# Patient Record
Sex: Female | Born: 1945 | Race: Black or African American | Hispanic: No | Marital: Married | Smoking: Former smoker
Health system: Southern US, Community
[De-identification: ages and names within clinical notes are randomized; demographics above are authoritative.]

## PROBLEM LIST (undated history)

## (undated) DIAGNOSIS — I255 Ischemic cardiomyopathy: Secondary | ICD-10-CM

## (undated) DIAGNOSIS — N183 Chronic kidney disease, stage 3 unspecified: Secondary | ICD-10-CM

## (undated) DIAGNOSIS — M545 Low back pain, unspecified: Secondary | ICD-10-CM

## (undated) DIAGNOSIS — M1990 Unspecified osteoarthritis, unspecified site: Secondary | ICD-10-CM

## (undated) DIAGNOSIS — Z87891 Personal history of nicotine dependence: Secondary | ICD-10-CM

## (undated) DIAGNOSIS — Z9981 Dependence on supplemental oxygen: Secondary | ICD-10-CM

## (undated) DIAGNOSIS — Z95810 Presence of automatic (implantable) cardiac defibrillator: Secondary | ICD-10-CM

## (undated) DIAGNOSIS — I509 Heart failure, unspecified: Secondary | ICD-10-CM

## (undated) DIAGNOSIS — E119 Type 2 diabetes mellitus without complications: Secondary | ICD-10-CM

## (undated) DIAGNOSIS — E039 Hypothyroidism, unspecified: Secondary | ICD-10-CM

## (undated) DIAGNOSIS — I219 Acute myocardial infarction, unspecified: Secondary | ICD-10-CM

## (undated) DIAGNOSIS — E7800 Pure hypercholesterolemia, unspecified: Secondary | ICD-10-CM

## (undated) DIAGNOSIS — I4901 Ventricular fibrillation: Secondary | ICD-10-CM

## (undated) DIAGNOSIS — G8929 Other chronic pain: Secondary | ICD-10-CM

## (undated) DIAGNOSIS — I10 Essential (primary) hypertension: Secondary | ICD-10-CM

## (undated) DIAGNOSIS — J189 Pneumonia, unspecified organism: Secondary | ICD-10-CM

## (undated) HISTORY — DX: Personal history of nicotine dependence: Z87.891

## (undated) HISTORY — PX: ABDOMINAL HYSTERECTOMY: SHX81

## (undated) HISTORY — DX: Pure hypercholesterolemia, unspecified: E78.00

## (undated) HISTORY — DX: Essential (primary) hypertension: I10

## (undated) HISTORY — DX: Ventricular fibrillation: I49.01

## (undated) HISTORY — DX: Ischemic cardiomyopathy: I25.5

## (undated) HISTORY — DX: Hypothyroidism, unspecified: E03.9

## (undated) HISTORY — DX: Acute myocardial infarction, unspecified: I21.9

---

## 1998-01-08 ENCOUNTER — Ambulatory Visit (HOSPITAL_COMMUNITY): Admission: RE | Admit: 1998-01-08 | Discharge: 1998-01-08 | Payer: Self-pay | Admitting: Cardiology

## 1998-01-16 ENCOUNTER — Ambulatory Visit (HOSPITAL_COMMUNITY): Admission: RE | Admit: 1998-01-16 | Discharge: 1998-01-17 | Payer: Self-pay | Admitting: General Surgery

## 1998-03-11 ENCOUNTER — Ambulatory Visit (HOSPITAL_COMMUNITY): Admission: RE | Admit: 1998-03-11 | Discharge: 1998-03-11 | Payer: Self-pay | Admitting: Cardiology

## 1998-04-07 ENCOUNTER — Inpatient Hospital Stay (HOSPITAL_COMMUNITY): Admission: RE | Admit: 1998-04-07 | Discharge: 1998-04-10 | Payer: Self-pay | Admitting: Neurosurgery

## 1998-10-30 ENCOUNTER — Ambulatory Visit (HOSPITAL_COMMUNITY): Admission: RE | Admit: 1998-10-30 | Discharge: 1998-10-30 | Payer: Self-pay | Admitting: Neurosurgery

## 1998-10-30 ENCOUNTER — Encounter: Payer: Self-pay | Admitting: Neurosurgery

## 1999-02-24 ENCOUNTER — Other Ambulatory Visit: Admission: RE | Admit: 1999-02-24 | Discharge: 1999-02-24 | Payer: Self-pay | Admitting: Obstetrics

## 1999-11-02 ENCOUNTER — Encounter: Payer: Self-pay | Admitting: Neurosurgery

## 1999-11-02 ENCOUNTER — Ambulatory Visit (HOSPITAL_COMMUNITY): Admission: RE | Admit: 1999-11-02 | Discharge: 1999-11-02 | Payer: Self-pay | Admitting: Neurosurgery

## 1999-11-13 ENCOUNTER — Encounter: Payer: Self-pay | Admitting: Neurosurgery

## 1999-11-13 ENCOUNTER — Ambulatory Visit (HOSPITAL_COMMUNITY): Admission: RE | Admit: 1999-11-13 | Discharge: 1999-11-13 | Payer: Self-pay | Admitting: Neurosurgery

## 2000-08-11 ENCOUNTER — Encounter: Admission: RE | Admit: 2000-08-11 | Discharge: 2000-08-11 | Payer: Self-pay | Admitting: Cardiology

## 2000-08-11 ENCOUNTER — Encounter: Payer: Self-pay | Admitting: Cardiology

## 2000-10-31 ENCOUNTER — Ambulatory Visit (HOSPITAL_COMMUNITY): Admission: RE | Admit: 2000-10-31 | Discharge: 2000-10-31 | Payer: Self-pay | Admitting: Neurosurgery

## 2000-10-31 ENCOUNTER — Encounter: Payer: Self-pay | Admitting: Neurosurgery

## 2001-11-01 ENCOUNTER — Ambulatory Visit (HOSPITAL_COMMUNITY): Admission: RE | Admit: 2001-11-01 | Discharge: 2001-11-01 | Payer: Self-pay | Admitting: Neurosurgery

## 2001-11-01 ENCOUNTER — Encounter: Payer: Self-pay | Admitting: Neurosurgery

## 2004-12-11 ENCOUNTER — Ambulatory Visit (HOSPITAL_COMMUNITY): Admission: RE | Admit: 2004-12-11 | Discharge: 2004-12-11 | Payer: Self-pay | Admitting: Cardiology

## 2005-08-02 ENCOUNTER — Encounter: Admission: RE | Admit: 2005-08-02 | Discharge: 2005-08-02 | Payer: Self-pay | Admitting: Cardiology

## 2005-09-16 ENCOUNTER — Ambulatory Visit (HOSPITAL_COMMUNITY): Admission: RE | Admit: 2005-09-16 | Discharge: 2005-09-16 | Payer: Self-pay | Admitting: Cardiology

## 2008-08-16 ENCOUNTER — Observation Stay (HOSPITAL_COMMUNITY): Admission: EM | Admit: 2008-08-16 | Discharge: 2008-08-18 | Payer: Self-pay | Admitting: Emergency Medicine

## 2008-11-08 ENCOUNTER — Ambulatory Visit (HOSPITAL_COMMUNITY): Admission: RE | Admit: 2008-11-08 | Discharge: 2008-11-08 | Payer: Self-pay | Admitting: Cardiology

## 2009-02-27 ENCOUNTER — Inpatient Hospital Stay (HOSPITAL_COMMUNITY): Admission: EM | Admit: 2009-02-27 | Discharge: 2009-03-03 | Payer: Self-pay | Admitting: Emergency Medicine

## 2009-02-27 DIAGNOSIS — I219 Acute myocardial infarction, unspecified: Secondary | ICD-10-CM

## 2009-02-27 HISTORY — DX: Acute myocardial infarction, unspecified: I21.9

## 2009-02-27 HISTORY — PX: CORONARY ANGIOPLASTY WITH STENT PLACEMENT: SHX49

## 2009-08-30 HISTORY — PX: CARDIAC DEFIBRILLATOR PLACEMENT: SHX171

## 2009-09-19 ENCOUNTER — Encounter (HOSPITAL_COMMUNITY): Admission: RE | Admit: 2009-09-19 | Discharge: 2009-12-05 | Payer: Self-pay | Admitting: Cardiology

## 2010-01-28 DIAGNOSIS — I4901 Ventricular fibrillation: Secondary | ICD-10-CM

## 2010-01-28 HISTORY — DX: Ventricular fibrillation: I49.01

## 2010-02-07 ENCOUNTER — Ambulatory Visit: Payer: Self-pay | Admitting: Pulmonary Disease

## 2010-02-07 ENCOUNTER — Ambulatory Visit: Payer: Self-pay | Admitting: Internal Medicine

## 2010-02-07 ENCOUNTER — Inpatient Hospital Stay (HOSPITAL_COMMUNITY): Admission: EM | Admit: 2010-02-07 | Discharge: 2010-02-20 | Payer: Self-pay | Admitting: Emergency Medicine

## 2010-02-09 ENCOUNTER — Encounter (INDEPENDENT_AMBULATORY_CARE_PROVIDER_SITE_OTHER): Payer: Self-pay | Admitting: Pulmonary Disease

## 2010-02-17 ENCOUNTER — Encounter: Payer: Self-pay | Admitting: Internal Medicine

## 2010-02-18 ENCOUNTER — Encounter: Payer: Self-pay | Admitting: Internal Medicine

## 2010-02-19 ENCOUNTER — Encounter: Payer: Self-pay | Admitting: Internal Medicine

## 2010-02-27 DEATH — deceased

## 2010-03-10 ENCOUNTER — Encounter (INDEPENDENT_AMBULATORY_CARE_PROVIDER_SITE_OTHER): Payer: Self-pay | Admitting: *Deleted

## 2010-03-25 ENCOUNTER — Ambulatory Visit: Payer: Self-pay

## 2010-03-25 ENCOUNTER — Encounter: Payer: Self-pay | Admitting: Internal Medicine

## 2010-06-01 DIAGNOSIS — I10 Essential (primary) hypertension: Secondary | ICD-10-CM

## 2010-06-01 DIAGNOSIS — I2589 Other forms of chronic ischemic heart disease: Secondary | ICD-10-CM | POA: Insufficient documentation

## 2010-06-01 DIAGNOSIS — I2510 Atherosclerotic heart disease of native coronary artery without angina pectoris: Secondary | ICD-10-CM | POA: Insufficient documentation

## 2010-06-01 DIAGNOSIS — Z95810 Presence of automatic (implantable) cardiac defibrillator: Secondary | ICD-10-CM | POA: Insufficient documentation

## 2010-06-02 ENCOUNTER — Ambulatory Visit: Payer: Self-pay | Admitting: Internal Medicine

## 2010-06-02 LAB — CONVERTED CEMR LAB: Troponin I: 0.02 ng/mL (ref <0.06)

## 2010-06-08 ENCOUNTER — Encounter (HOSPITAL_COMMUNITY)
Admission: RE | Admit: 2010-06-08 | Discharge: 2010-06-18 | Payer: Self-pay | Source: Home / Self Care | Admitting: Cardiology

## 2010-09-03 ENCOUNTER — Ambulatory Visit
Admission: RE | Admit: 2010-09-03 | Discharge: 2010-09-03 | Payer: Self-pay | Source: Home / Self Care | Attending: Internal Medicine | Admitting: Internal Medicine

## 2010-09-03 ENCOUNTER — Encounter: Payer: Self-pay | Admitting: Internal Medicine

## 2010-09-29 NOTE — Cardiovascular Report (Signed)
Summary: Implantable Device Registration Card   Implantable Device Registration Card   Imported By: Roderic Ovens 03/27/2010 08:46:54  _____________________________________________________________________  External Attachment:    Type:   Image     Comment:   External Document

## 2010-09-29 NOTE — Miscellaneous (Signed)
Summary: Device preload  Clinical Lists Changes  Observations: Added new observation of ICD INDICATN: CM (02/19/2010 11:47) Added new observation of ICDLEADSTAT1: active (02/19/2010 11:47) Added new observation of ICDLEADSER1: [id number] (02/19/2010 11:47) Added new observation of ICDLEADMOD1: 7122  (02/19/2010 11:47) Added new observation of ICDLEADLOC1: RV  (02/19/2010 11:47) Added new observation of ICD IMP MD: Crystal Manges, MD  (02/19/2010 11:47) Added new observation of ICDLEADDOI1: 02/18/2010  (02/19/2010 11:47) Added new observation of ICD IMPL DTE: 02/18/2010  (02/19/2010 11:47) Added new observation of ICD SERL#: [id number]  (02/19/2010 11:47) Added new observation of ICD MODL#: [id number]  (02/19/2010 65:78) Added new observation of ICDMANUFACTR: St Jude  (02/19/2010 11:47) Added new observation of ICD MD: Crystal Manges, MD  (02/19/2010 11:47)       ICD Specifications Following MD:  Crystal Manges, MD     ICD Vendor:  St Jude     ICD Model Number:  [id number]     ICD Serial Number:  [id number] ICD DOI:  02/18/2010     ICD Implanting MD:  Crystal Manges, MD  Lead 1:    Location: RV     DOI: 02/18/2010     Model #: [id number]     Serial #: [id number]     Status: active  Indications::  CM

## 2010-09-29 NOTE — Cardiovascular Report (Signed)
Summary: Office Visit   Office Visit   Imported By: Roderic Ovens 06/08/2010 11:24:27  _____________________________________________________________________  External Attachment:    Type:   Image     Comment:   External Document

## 2010-09-29 NOTE — Letter (Signed)
Summary: Device-Delinquent Check  [facility], Main Office  [address]   [place], [place] [address]   Phone: [phone]  Fax: [phone]     March 10, 2010 MRN: [id number]   [facility] [address] [place], [place]  [address]   Dear Ms. Clonch,  According to our records, you have not had your implanted device checked in the recommended period of time.  We are unable to determine appropriate device function without checking your device on a regular basis.  Please call our office to schedule an appointment as soon as possible.  If you are having your device checked by another physician, please call us so that we may update our records.  Thank you,  Altha Harm, LPN  March 10, 2010 11:00 AM  [facility]

## 2010-09-29 NOTE — Assessment & Plan Note (Signed)
Summary: pc2/lg   Visit Type:  pc2  CC:  chest tightness- Left arm pain 3 days ago.  History of Present Illness: Crystal Duran is seen following ICD implantation in June 2011 reported cardiac arrest occurring in the context of a previous myocardial infarction and catheterization demonstrating mostly in residual nonobstructive disease. Ejection fraction was 20-25%. Was felt her cardiomyopathy was out of proportion to her coronary disease. She has class II congestive failure  She has had problems last week with chest pain on the right side similar to what she says her heart attack was like there was some radiation to the left. She saw Dr. Sharyn Lull who undertook an electrocardiogram. He  arranged for her to have a stress test. His also had a problem with a cough and she is currently taking antibiotics.         Current Medications (verified): 1)  Aspirin 81 Mg Tbec (Aspirin) .... Take 1 Tablet By Mouth Once Daily 2)  Pantoprazole Sodium 40 Mg Tbec (Pantoprazole Sodium) .... Take 1 Tablet By Mouth Once Daily 3)  Carvedilol 25 Mg Tabs (Carvedilol) .... Take 1 Tablet By Mouth Two Times A Day 4)  Crestor 20 Mg Tabs (Rosuvastatin Calcium) .... Take 1 Tablet By Mouth Once Daily 5)  Effient 10 Mg Tabs (Prasugrel Hcl) .... Take 1 Tablet By Mouth Once Daily 6)  Furosemide 40 Mg Tabs (Furosemide) .... Take 1 Tablet By Mouth Two Times A Day 7)  Levothroid 125 Mcg Tabs (Levothyroxine Sodium) .... Take 1 Tablet By Mouth Once Daily 8)  Ramipril 10 Mg Caps (Ramipril) .... Take 1 Capsule By Mouth Two Times A Day 9)  Spironolactone 25 Mg Tabs (Spironolactone) .... Take 1 Tablet By Mouth Once Daily 10)  Losartan Potassium 50 Mg Tabs (Losartan Potassium) .... Take 1 Tablet By Mouth Once Daily 11)  Isosorbide Mononitrate Cr 120 Mg Xr24h-Tab (Isosorbide Mononitrate) .... 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 Jude Fortify ICD [phone] Compensated systolic heart failure Coronary artery disease History of inferior wall myocardial infarction in the past Status post left catheterization with patent coronary arteries Severe ischemic cardiomyopathy Hypertension Hypercholesteremia Hypothyroidism History of tobacco abuse Mild renal insufficiencY  Past Surgical History: Last updated: 06/01/2010 Single-chamber defibrillator implantation- St. Jude Fortify ICD 123-40Q hysterectomy  Family History: Last updated: 06/01/2010 Positive for coronary artery disease  Social History: Last updated: 06/01/2010 She lives in [place].   She is retired Runner, broadcasting/film/video Used to smoke in the past, quit 10 years ago Married   Vital Signs:  Patient profile:   65 year old female Height:      64 inches Weight:      185.50 pounds BMI:     31.96 Pulse rate:   79 / minute Pulse rhythm:   regular Resp:     18 per minute BP sitting:   150 / 80  (right arm) Cuff size:   large  Vitals Entered By: Vikki Ports (June 02, 2010 1:32 PM)  Physical Exam  General:  The patient was alert and oriented in no acute distress but ill apppearing HEENT Normal.  Neck veins were flat, carotids were brisk.  Lungs were clear.  Heart sounds were regular without murmurs or gallops.  Abdomen was soft with active bowel sounds. There is no clubbing cyanosis or edema. Skin Warm and dry  ICD Specifications Following MD:  Sherryl Manges, MD     ICD Vendor:  [facility] Jude     ICD Model Number:  [id number]     ICD Serial Number:  [id number] ICD DOI:  02/18/2010     ICD Implanting MD:  Sherryl Manges, MD  Lead 1:    Location: RV     DOI: 02/18/2010     Model #: [id number]     Serial #: [id number]     Status: active  Indications::  CM   ICD Follow Up Remote Check?  No Charge Time:  8.0 seconds     Battery  Est. Longevity:  9.0 years Underlying rhythm:  SR ICD Dependent:  No       ICD Device Measurements Right Ventricle:  Amplitude: 12 mV, Impedance: 450 ohms, Threshold: 0.75 V at 0.4 msec Shock Impedance: 69 ohms   Episodes Shock:  0     ATP:  0     Nonsustained:  0     Ventricular Pacing:  <1%  Brady Parameters Mode VVI     Lower Rate Limit:  40      Tachy Zones VF:  240     VT:  200     Next Remote Date:  09/03/2010     Next Cardiology Appt Due:  01/29/2011 Tech Comments:  Autocapture on.  Device function normal.  Merlin transmissions every 3 months.  ROV 6/12 with Dr. Graciela Husbands. Altha Harm, LPN  June 02, 2010 2:27 PM   Impression & Recommendations:  Problem # 1:  CHEST PAIN (ICD-786.50)  she had prolonged chest pain last week suggestive of myocardial ischemia. We will check a troponin as it might have an impact on whether Dr. Sharyn Lull would pursue stress testing or not. TnI was normal Her updated medication list for this problem includes:    Aspirin 81 Mg Tbec (Aspirin) .[name]... Take 1 tablet by mouth once daily    Carvedilol 25 Mg Tabs (Carvedilol) .[name]... Take 1 tablet by mouth two times a day    Effient 10 Mg Tabs (Prasugrel hcl) .[name]... Take 1 tablet by mouth once daily    Ramipril 10 Mg Caps (Ramipril) .[name]... Take 1 capsule by mouth two times a day    Isosorbide Mononitrate Cr 120 Mg Xr24h-tab (Isosorbide mononitrate) .[name]... Take one tablet by mouth daily  Her updated medication list for this problem includes:    Aspirin 81 Mg Tbec (Aspirin) .[name]... Take 1 tablet by mouth once daily    Carvedilol 25 Mg Tabs (Carvedilol) .[name]... Take 1 tablet by mouth two times a day    Effient 10 Mg Tabs (Prasugrel hcl) .[name]... Take 1 tablet by mouth once daily    Ramipril 10 Mg Caps (Ramipril) .[name]... Take 1 capsule by mouth two times a day    Isosorbide Mononitrate Cr 120 Mg Xr24h-tab (Isosorbide mononitrate) .[name]... Take one tablet by mouth daily  Problem # 2:  IMPLANTATION OF DEFIBRILLATOR,ST. JUDE  FORTIFY ICD 123-40Q (ICD-V45.02) Device parameters and data were reviewed and no changes were made  no intercurrent therapy  Problem # 3:  CARDIOMYOPATHY, ISCHEMIC (ICD-414.8)  stable on her current medications Her updated medication list for this problem includes:    Aspirin 81 Mg Tbec (Aspirin) .[name]... Take 1 tablet by mouth once daily    Carvedilol 25 Mg Tabs (Carvedilol) .[name]... Take 1 tablet by mouth two times a day    Effient 10 Mg Tabs (Prasugrel hcl) .[name]... Take 1 tablet by mouth once daily  Furosemide 40 Mg Tabs (Furosemide) .[name]... Take 1 tablet by mouth two times a day    Ramipril 10 Mg Caps (Ramipril) .[name]... Take 1 capsule by mouth two times a day    Spironolactone 25 Mg Tabs (Spironolactone) .[name]... Take 1 tablet by mouth once daily    Losartan Potassium 50 Mg Tabs (Losartan potassium) .[name]... Take 1 tablet by mouth once daily    Isosorbide Mononitrate Cr 120 Mg Xr24h-tab (Isosorbide mononitrate) .[name]... Take one tablet by mouth daily  Her updated medication list for this problem includes:    Aspirin 81 Mg Tbec (Aspirin) .[name]... Take 1 tablet by mouth once daily    Carvedilol 25 Mg Tabs (Carvedilol) .[name]... Take 1 tablet by mouth two times a day    Effient 10 Mg Tabs (Prasugrel hcl) .[name]... Take 1 tablet by mouth once daily    Furosemide 40 Mg Tabs (Furosemide) .[name]... Take 1 tablet by mouth two times a day    Ramipril 10 Mg Caps (Ramipril) .[name]... Take 1 capsule by mouth two times a day    Spironolactone 25 Mg Tabs (Spironolactone) .[name]... Take 1 tablet by mouth once daily    Losartan Potassium 50 Mg Tabs (Losartan potassium) .[name]... Take 1 tablet by mouth once daily    Isosorbide Mononitrate Cr 120 Mg Xr24h-tab (Isosorbide mononitrate) .[name]... Take one tablet by mouth daily  Orders: T-Troponin I ([id number])  Patient Instructions: 1)  Your physician wants you to follow-up in: June 2012  You will receive a reminder letter in the mail two months in advance. If you don't receive a letter, please  call our office to schedule the follow-up appointment. 2)  Your physician recommends that your Troponin checked today.

## 2010-09-29 NOTE — Procedures (Signed)
Summary: wch per pt call/rsc from nos on 7/7/lg   Current Medications (verified): 1)  Aspirin 81 Mg Tbec (Aspirin) .... Take 1 Tablet By Mouth Once Daily 2)  Pantoprazole Sodium 40 Mg Tbec (Pantoprazole Sodium) .... Take 1 Tablet By Mouth Once Daily 3)  Carvedilol 25 Mg Tabs (Carvedilol) .... Take 1 Tablet By Mouth Two Times A Day 4)  Crestor 20 Mg Tabs (Rosuvastatin Calcium) .... Take 1 Tablet By Mouth Once Daily 5)  Effient 10 Mg Tabs (Prasugrel Hcl) .... Take 1 Tablet By Mouth Once Daily 6)  Furosemide 40 Mg Tabs (Furosemide) .... Take 1 Tablet By Mouth Two Times A Day 7)  Levothroid 125 Mcg Tabs (Levothyroxine Sodium) .... Take 1 Tablet By Mouth Once Daily 8)  Ramipril 10 Mg Caps (Ramipril) .... Take 1 Capsule By Mouth Two Times A Day 9)  Spironolactone 25 Mg Tabs (Spironolactone) .... Take 1 Tablet By Mouth Once Daily 10)  Losartan Potassium 50 Mg Tabs (Losartan Potassium) .... Take 1 Tablet By Mouth Once Daily  Allergies (verified): No Known Drug Allergies   ICD Specifications Following MD:  Sherryl Manges, MD     ICD Vendor:  St Jude     ICD Model Number:  [phone]     ICD Serial Number:  [id number] ICD DOI:  02/18/2010     ICD Implanting MD:  Sherryl Manges, MD  Lead 1:    Location: RV     DOI: 02/18/2010     Model #: [id number]     Serial #: [id number]     Status: active  Indications::  CM   ICD Follow Up Battery Voltage:  95% V     Charge Time:  8.0 seconds     Battery Est. Longevity:  8.2-9.1 yrs Underlying rhythm:  SR   ICD Device Measurements Right Ventricle:  Amplitude: 12.0 mV, Impedance: 460 ohms, Threshold: 0.75 V at 0.4 msec  Episodes MS Episodes:  0     Shock:  0     ATP:  0     Nonsustained:  0     Atrial Therapies:  0 Ventricular Pacing:  <1%  Brady Parameters Mode VVI     Lower Rate Limit:  40      Tachy Zones VF:  240     VT:  200     Next Cardiology Appt Due:  06/01/2010 Tech Comments:  WOUND CHECK (NOS ON 03-05-10)--STERI STRIPS REMOVED.  NO REDNESS OR SWELLING AT  SITE.  NORMAL DEVICE FUNCTION.  NO CHANGES MADE.  INSTRUCTIONS WENT OVER W/PT IN REGARDS TO THERAPY.  PT ENROLLED IN [facility] AND UNDERSTANDS.  ROV IN 3 MTHS W/SK.  Vella Kohler  March 25, 2010 3:12 PM

## 2010-09-29 NOTE — Cardiovascular Report (Signed)
Summary: [facility]  [facility]   Imported By: Marylou Mccoy 03/20/2010 13:25:06  _____________________________________________________________________  External Attachment:    Type:   Image     Comment:   External Document

## 2010-09-29 NOTE — Cardiovascular Report (Signed)
Summary: Office Visit   Office Visit   Imported By: Roderic Ovens 03/27/2010 10:22:23  _____________________________________________________________________  External Attachment:    Type:   Image     Comment:   External Document

## 2010-10-11 ENCOUNTER — Encounter (INDEPENDENT_AMBULATORY_CARE_PROVIDER_SITE_OTHER): Payer: Self-pay | Admitting: *Deleted

## 2010-10-21 NOTE — Letter (Signed)
Summary: Remote Device Check  [facility], Main Office  [address]   [place], [place] [address]   Phone: [phone]  Fax: [phone]     October 11, 2010 MRN: [id number]   [facility] [address] [place], [place]  [address]   Dear Ms. Forgione,   Your remote transmission was recieved and reviewed by your physician.  All diagnostics were within normal limits for you.  __X___Your next transmission is scheduled for:  12-03-2010.  Please transmit at any time this day.  If you have a wireless device your transmission will be sent automatically.   Sincerely,  Vella Kohler

## 2010-10-21 NOTE — Cardiovascular Report (Signed)
Summary: Office Visit Remote   Office Visit Remote   Imported By: Roderic Ovens 10/13/2010 15:20:22  _____________________________________________________________________  External Attachment:    Type:   Image     Comment:   External Document

## 2010-11-15 LAB — COMPREHENSIVE METABOLIC PANEL
ALT: 16 U/L (ref 0–35)
AST: 15 U/L (ref 0–37)
Albumin: 2.9 g/dL — ABNORMAL LOW (ref 3.5–5.2)
Alkaline Phosphatase: 62 U/L (ref 39–117)
BUN: 4 mg/dL — ABNORMAL LOW (ref 6–23)
BUN: 6 mg/dL (ref 6–23)
Calcium: 8.4 mg/dL (ref 8.4–10.5)
Creatinine, Ser: 1.58 mg/dL — ABNORMAL HIGH (ref 0.4–1.2)
GFR calc Af Amer: 43 mL/min — ABNORMAL LOW (ref >60)
Glucose, Bld: 99 mg/dL (ref 70–99)
Potassium: 3.7 mEq/L (ref 3.5–5.1)
Sodium: 140 mEq/L (ref 135–145)
Sodium: 141 mEq/L (ref 135–145)
Total Protein: 5.8 g/dL — ABNORMAL LOW (ref 6.0–8.3)
Total Protein: 6.5 g/dL (ref 6.0–8.3)

## 2010-11-15 LAB — BASIC METABOLIC PANEL
BUN: 10 mg/dL (ref 6–23)
BUN: 6 mg/dL (ref 6–23)
BUN: 7 mg/dL (ref 6–23)
CO2: 22 mEq/L (ref 19–32)
CO2: 26 mEq/L (ref 19–32)
CO2: 26 mEq/L (ref 19–32)
CO2: 26 mEq/L (ref 19–32)
Calcium: 7.6 mg/dL — ABNORMAL LOW (ref 8.4–10.5)
Calcium: 7.9 mg/dL — ABNORMAL LOW (ref 8.4–10.5)
Calcium: 8.1 mg/dL — ABNORMAL LOW (ref 8.4–10.5)
Calcium: 8.1 mg/dL — ABNORMAL LOW (ref 8.4–10.5)
Calcium: 8.2 mg/dL — ABNORMAL LOW (ref 8.4–10.5)
Chloride: 107 mEq/L (ref 96–112)
Chloride: 112 mEq/L (ref 96–112)
Chloride: 99 mEq/L (ref 96–112)
Creatinine, Ser: 1.42 mg/dL — ABNORMAL HIGH (ref 0.4–1.2)
Creatinine, Ser: 1.76 mg/dL — ABNORMAL HIGH (ref 0.4–1.2)
Creatinine, Ser: 1.77 mg/dL — ABNORMAL HIGH (ref 0.4–1.2)
GFR calc Af Amer: 41 mL/min — ABNORMAL LOW (ref >60)
GFR calc Af Amer: 43 mL/min — ABNORMAL LOW (ref >60)
GFR calc Af Amer: 45 mL/min — ABNORMAL LOW (ref >60)
GFR calc Af Amer: 46 mL/min — ABNORMAL LOW (ref >60)
GFR calc non Af Amer: 29 mL/min — ABNORMAL LOW (ref >60)
Glucose, Bld: 106 mg/dL — ABNORMAL HIGH (ref 70–99)
Glucose, Bld: 111 mg/dL — ABNORMAL HIGH (ref 70–99)
Glucose, Bld: 116 mg/dL — ABNORMAL HIGH (ref 70–99)
Potassium: 3.1 mEq/L — ABNORMAL LOW (ref 3.5–5.1)
Potassium: 3.5 mEq/L (ref 3.5–5.1)
Potassium: 3.9 mEq/L (ref 3.5–5.1)
Potassium: 4.1 mEq/L (ref 3.5–5.1)
Sodium: 135 mEq/L (ref 135–145)
Sodium: 136 mEq/L (ref 135–145)

## 2010-11-15 LAB — CBC
HCT: 26.2 % — ABNORMAL LOW (ref 36.0–46.0)
HCT: 26.4 % — ABNORMAL LOW (ref 36.0–46.0)
HCT: 28.3 % — ABNORMAL LOW (ref 36.0–46.0)
HCT: 29.3 % — ABNORMAL LOW (ref 36.0–46.0)
HCT: 31.7 % — ABNORMAL LOW (ref 36.0–46.0)
Hemoglobin: 10.6 g/dL — ABNORMAL LOW (ref 12.0–15.0)
Hemoglobin: 10.8 g/dL — ABNORMAL LOW (ref 12.0–15.0)
Hemoglobin: 8.9 g/dL — ABNORMAL LOW (ref 12.0–15.0)
MCH: 31.6 pg (ref 26.0–34.0)
MCHC: 33.5 g/dL (ref 30.0–36.0)
MCHC: 33.5 g/dL (ref 30.0–36.0)
MCHC: 33.7 g/dL (ref 30.0–36.0)
MCHC: 33.7 g/dL (ref 30.0–36.0)
MCHC: 33.7 g/dL (ref 30.0–36.0)
MCHC: 34.2 g/dL (ref 30.0–36.0)
MCV: 93.6 fL (ref 78.0–100.0)
MCV: 93.7 fL (ref 78.0–100.0)
MCV: 94.2 fL (ref 78.0–100.0)
MCV: 94.3 fL (ref 78.0–100.0)
MCV: 94.4 fL (ref 78.0–100.0)
MCV: 94.6 fL (ref 78.0–100.0)
Platelets: 132 10*3/uL — ABNORMAL LOW (ref 150–400)
Platelets: 145 10*3/uL — ABNORMAL LOW (ref 150–400)
Platelets: 182 10*3/uL (ref 150–400)
Platelets: 202 10*3/uL (ref 150–400)
RBC: 2.79 MIL/uL — ABNORMAL LOW (ref 3.87–5.11)
RBC: 3.06 MIL/uL — ABNORMAL LOW (ref 3.87–5.11)
RBC: 3.08 MIL/uL — ABNORMAL LOW (ref 3.87–5.11)
RBC: 3.11 MIL/uL — ABNORMAL LOW (ref 3.87–5.11)
RBC: 3.37 MIL/uL — ABNORMAL LOW (ref 3.87–5.11)
RBC: 3.39 MIL/uL — ABNORMAL LOW (ref 3.87–5.11)
RDW: 16.5 % — ABNORMAL HIGH (ref 11.5–15.5)
RDW: 16.6 % — ABNORMAL HIGH (ref 11.5–15.5)
RDW: 16.7 % — ABNORMAL HIGH (ref 11.5–15.5)
RDW: 16.9 % — ABNORMAL HIGH (ref 11.5–15.5)
RDW: 17 % — ABNORMAL HIGH (ref 11.5–15.5)
WBC: 3.7 10*3/uL — ABNORMAL LOW (ref 4.0–10.5)
WBC: 5.4 10*3/uL (ref 4.0–10.5)
WBC: 5.7 10*3/uL (ref 4.0–10.5)
WBC: 6.9 10*3/uL (ref 4.0–10.5)
WBC: 7.2 10*3/uL (ref 4.0–10.5)

## 2010-11-15 LAB — CK TOTAL AND CKMB (NOT AT ARMC)
Relative Index: INVALID (ref 0.0–2.5)
Total CK: 84 U/L (ref 7–177)

## 2010-11-15 LAB — DIFFERENTIAL
Basophils Relative: 1 % (ref 0–1)
Eosinophils Relative: 3 % (ref 0–5)
Lymphs Abs: 0.7 10*3/uL (ref 0.7–4.0)
Monocytes Absolute: 0.3 10*3/uL (ref 0.1–1.0)
Monocytes Relative: 8 % (ref 3–12)
Neutrophils Relative %: 69 % (ref 43–77)

## 2010-11-15 LAB — PHOSPHORUS: Phosphorus: 3.9 mg/dL (ref 2.3–4.6)

## 2010-11-15 LAB — GLUCOSE, CAPILLARY
Glucose-Capillary: 105 mg/dL — ABNORMAL HIGH (ref 70–99)
Glucose-Capillary: 118 mg/dL — ABNORMAL HIGH (ref 70–99)

## 2010-11-15 LAB — TROPONIN I: Troponin I: 0.02 ng/mL (ref 0.00–0.06)

## 2010-11-15 LAB — PROTIME-INR: Prothrombin Time: 14 seconds (ref 11.6–15.2)

## 2010-11-15 LAB — APTT: aPTT: 29 seconds (ref 24–37)

## 2010-11-15 LAB — HEPARIN LEVEL (UNFRACTIONATED): Heparin Unfractionated: 0.47 IU/mL (ref 0.30–0.70)

## 2010-11-16 LAB — BASIC METABOLIC PANEL
BUN: 12 mg/dL (ref 6–23)
BUN: 13 mg/dL (ref 6–23)
BUN: 18 mg/dL (ref 6–23)
BUN: 20 mg/dL (ref 6–23)
BUN: 21 mg/dL (ref 6–23)
BUN: 21 mg/dL (ref 6–23)
BUN: 21 mg/dL (ref 6–23)
BUN: 22 mg/dL (ref 6–23)
CO2: 17 mEq/L — ABNORMAL LOW (ref 19–32)
CO2: 17 mEq/L — ABNORMAL LOW (ref 19–32)
CO2: 18 mEq/L — ABNORMAL LOW (ref 19–32)
CO2: 18 mEq/L — ABNORMAL LOW (ref 19–32)
CO2: 21 mEq/L (ref 19–32)
Calcium: 7 mg/dL — ABNORMAL LOW (ref 8.4–10.5)
Calcium: 7.1 mg/dL — ABNORMAL LOW (ref 8.4–10.5)
Calcium: 7.2 mg/dL — ABNORMAL LOW (ref 8.4–10.5)
Calcium: 7.2 mg/dL — ABNORMAL LOW (ref 8.4–10.5)
Calcium: 7.3 mg/dL — ABNORMAL LOW (ref 8.4–10.5)
Calcium: 7.6 mg/dL — ABNORMAL LOW (ref 8.4–10.5)
Chloride: 115 mEq/L — ABNORMAL HIGH (ref 96–112)
Chloride: 116 mEq/L — ABNORMAL HIGH (ref 96–112)
Chloride: 118 mEq/L — ABNORMAL HIGH (ref 96–112)
Chloride: 119 mEq/L — ABNORMAL HIGH (ref 96–112)
Creatinine, Ser: 1.05 mg/dL (ref 0.4–1.2)
Creatinine, Ser: 1.1 mg/dL (ref 0.4–1.2)
Creatinine, Ser: 1.19 mg/dL (ref 0.4–1.2)
Creatinine, Ser: 1.25 mg/dL — ABNORMAL HIGH (ref 0.4–1.2)
Creatinine, Ser: 1.32 mg/dL — ABNORMAL HIGH (ref 0.4–1.2)
Creatinine, Ser: 1.37 mg/dL — ABNORMAL HIGH (ref 0.4–1.2)
Creatinine, Ser: 1.46 mg/dL — ABNORMAL HIGH (ref 0.4–1.2)
GFR calc Af Amer: 44 mL/min — ABNORMAL LOW (ref >60)
GFR calc Af Amer: 47 mL/min — ABNORMAL LOW (ref >60)
GFR calc Af Amer: 49 mL/min — ABNORMAL LOW (ref >60)
GFR calc non Af Amer: 30 mL/min — ABNORMAL LOW (ref >60)
GFR calc non Af Amer: 39 mL/min — ABNORMAL LOW (ref >60)
GFR calc non Af Amer: 43 mL/min — ABNORMAL LOW (ref >60)
GFR calc non Af Amer: 50 mL/min — ABNORMAL LOW (ref >60)
GFR calc non Af Amer: 55 mL/min — ABNORMAL LOW (ref >60)
Glucose, Bld: 115 mg/dL — ABNORMAL HIGH (ref 70–99)
Glucose, Bld: 142 mg/dL — ABNORMAL HIGH (ref 70–99)
Glucose, Bld: 209 mg/dL — ABNORMAL HIGH (ref 70–99)
Glucose, Bld: 218 mg/dL — ABNORMAL HIGH (ref 70–99)
Glucose, Bld: 78 mg/dL (ref 70–99)
Glucose, Bld: 91 mg/dL (ref 70–99)
Potassium: 3.2 mEq/L — ABNORMAL LOW (ref 3.5–5.1)
Potassium: 3.4 mEq/L — ABNORMAL LOW (ref 3.5–5.1)
Potassium: 3.8 mEq/L (ref 3.5–5.1)
Potassium: 3.8 mEq/L (ref 3.5–5.1)
Potassium: 3.9 mEq/L (ref 3.5–5.1)
Sodium: 137 mEq/L (ref 135–145)
Sodium: 140 mEq/L (ref 135–145)
Sodium: 141 mEq/L (ref 135–145)

## 2010-11-16 LAB — GLUCOSE, CAPILLARY
Glucose-Capillary: 103 mg/dL — ABNORMAL HIGH (ref 70–99)
Glucose-Capillary: 108 mg/dL — ABNORMAL HIGH (ref 70–99)
Glucose-Capillary: 109 mg/dL — ABNORMAL HIGH (ref 70–99)
Glucose-Capillary: 116 mg/dL — ABNORMAL HIGH (ref 70–99)
Glucose-Capillary: 117 mg/dL — ABNORMAL HIGH (ref 70–99)
Glucose-Capillary: 117 mg/dL — ABNORMAL HIGH (ref 70–99)
Glucose-Capillary: 119 mg/dL — ABNORMAL HIGH (ref 70–99)
Glucose-Capillary: 124 mg/dL — ABNORMAL HIGH (ref 70–99)
Glucose-Capillary: 128 mg/dL — ABNORMAL HIGH (ref 70–99)
Glucose-Capillary: 136 mg/dL — ABNORMAL HIGH (ref 70–99)
Glucose-Capillary: 138 mg/dL — ABNORMAL HIGH (ref 70–99)
Glucose-Capillary: 140 mg/dL — ABNORMAL HIGH (ref 70–99)
Glucose-Capillary: 153 mg/dL — ABNORMAL HIGH (ref 70–99)
Glucose-Capillary: 164 mg/dL — ABNORMAL HIGH (ref 70–99)
Glucose-Capillary: 197 mg/dL — ABNORMAL HIGH (ref 70–99)
Glucose-Capillary: 65 mg/dL — ABNORMAL LOW (ref 70–99)
Glucose-Capillary: 74 mg/dL (ref 70–99)
Glucose-Capillary: 77 mg/dL (ref 70–99)
Glucose-Capillary: 83 mg/dL (ref 70–99)
Glucose-Capillary: 93 mg/dL (ref 70–99)
Glucose-Capillary: 94 mg/dL (ref 70–99)

## 2010-11-16 LAB — BASIC METABOLIC PANEL WITH GFR
BUN: 16 mg/dL (ref 6–23)
BUN: 22 mg/dL (ref 6–23)
CO2: 17 meq/L — ABNORMAL LOW (ref 19–32)
CO2: 19 meq/L (ref 19–32)
Calcium: 7.1 mg/dL — ABNORMAL LOW (ref 8.4–10.5)
Calcium: 7.2 mg/dL — ABNORMAL LOW (ref 8.4–10.5)
Chloride: 118 meq/L — ABNORMAL HIGH (ref 96–112)
Chloride: 118 meq/L — ABNORMAL HIGH (ref 96–112)
Creatinine, Ser: 1.03 mg/dL (ref 0.4–1.2)
Creatinine, Ser: 1.32 mg/dL — ABNORMAL HIGH (ref 0.4–1.2)
GFR calc non Af Amer: 41 mL/min — ABNORMAL LOW
GFR calc non Af Amer: 54 mL/min — ABNORMAL LOW
Glucose, Bld: 126 mg/dL — ABNORMAL HIGH (ref 70–99)
Glucose, Bld: 151 mg/dL — ABNORMAL HIGH (ref 70–99)
Potassium: 3.3 meq/L — ABNORMAL LOW (ref 3.5–5.1)
Potassium: 4.7 meq/L (ref 3.5–5.1)
Sodium: 138 meq/L (ref 135–145)
Sodium: 140 meq/L (ref 135–145)

## 2010-11-16 LAB — CBC
HCT: 29.1 % — ABNORMAL LOW (ref 36.0–46.0)
HCT: 35.1 % — ABNORMAL LOW (ref 36.0–46.0)
HCT: 42.5 % (ref 36.0–46.0)
Hemoglobin: 9.8 g/dL — ABNORMAL LOW (ref 12.0–15.0)
MCHC: 33.5 g/dL (ref 30.0–36.0)
MCHC: 33.6 g/dL (ref 30.0–36.0)
MCHC: 33.6 g/dL (ref 30.0–36.0)
MCHC: 34.5 g/dL (ref 30.0–36.0)
MCV: 93.1 fL (ref 78.0–100.0)
MCV: 93.3 fL (ref 78.0–100.0)
MCV: 94.3 fL (ref 78.0–100.0)
MCV: 94.7 fL (ref 78.0–100.0)
Platelets: 139 10*3/uL — ABNORMAL LOW (ref 150–400)
Platelets: 146 10*3/uL — ABNORMAL LOW (ref 150–400)
Platelets: 146 10*3/uL — ABNORMAL LOW (ref 150–400)
Platelets: 151 10*3/uL (ref 150–400)
Platelets: 190 10*3/uL (ref 150–400)
RBC: 3 MIL/uL — ABNORMAL LOW (ref 3.87–5.11)
RBC: 3.12 MIL/uL — ABNORMAL LOW (ref 3.87–5.11)
RDW: 16.6 % — ABNORMAL HIGH (ref 11.5–15.5)
RDW: 17 % — ABNORMAL HIGH (ref 11.5–15.5)
RDW: 17.2 % — ABNORMAL HIGH (ref 11.5–15.5)
WBC: 6.4 10*3/uL (ref 4.0–10.5)
WBC: 6.8 10*3/uL (ref 4.0–10.5)
WBC: 7.4 10*3/uL (ref 4.0–10.5)
WBC: 7.7 10*3/uL (ref 4.0–10.5)

## 2010-11-16 LAB — BLOOD GAS, ARTERIAL
Acid-base deficit: 7.9 mmol/L — ABNORMAL HIGH (ref 0.0–2.0)
Acid-base deficit: 9 mmol/L — ABNORMAL HIGH (ref 0.0–2.0)
Expiratory PAP: 5
FIO2: 0.3 %
FIO2: 1 %
Inspiratory PAP: 5
MECHVT: 500 mL
MECHVT: 500 mL
Mode: POSITIVE
O2 Saturation: 96.6 %
Patient temperature: 100
Patient temperature: 89.6
Patient temperature: 90
RATE: 12 resp/min
RATE: 12 resp/min
TCO2: 16.3 mmol/L (ref 0–100)
TCO2: 17.2 mmol/L (ref 0–100)
pCO2 arterial: 26.9 mmHg — ABNORMAL LOW (ref 35.0–45.0)
pH, Arterial: 7.389 (ref 7.350–7.400)

## 2010-11-16 LAB — CARDIAC PANEL(CRET KIN+CKTOT+MB+TROPI)
CK, MB: 2.3 ng/mL (ref 0.3–4.0)
Relative Index: INVALID (ref 0.0–2.5)
Relative Index: INVALID (ref 0.0–2.5)
Total CK: 77 U/L (ref 7–177)
Troponin I: 0.02 ng/mL (ref 0.00–0.06)

## 2010-11-16 LAB — MAGNESIUM
Magnesium: 1.7 mg/dL (ref 1.5–2.5)
Magnesium: 2.1 mg/dL (ref 1.5–2.5)

## 2010-11-16 LAB — URINALYSIS, ROUTINE W REFLEX MICROSCOPIC
Bilirubin Urine: NEGATIVE
Nitrite: NEGATIVE
Specific Gravity, Urine: 1.022 (ref 1.005–1.030)
Urobilinogen, UA: 1 mg/dL (ref 0.0–1.0)

## 2010-11-16 LAB — PHOSPHORUS
Phosphorus: 2.7 mg/dL (ref 2.3–4.6)
Phosphorus: 3.2 mg/dL (ref 2.3–4.6)
Phosphorus: 3.6 mg/dL (ref 2.3–4.6)

## 2010-11-16 LAB — POCT CARDIAC MARKERS
CKMB, poc: 1.1 ng/mL (ref 1.0–8.0)
Troponin i, poc: <0.05 ng/mL (ref 0.00–0.09)

## 2010-11-16 LAB — HEPATIC FUNCTION PANEL
ALT: 21 U/L (ref 0–35)
AST: 21 U/L (ref 0–37)
Albumin: 2.4 g/dL — ABNORMAL LOW (ref 3.5–5.2)
Bilirubin, Direct: 0.1 mg/dL (ref 0.0–0.3)
Total Protein: 5 g/dL — ABNORMAL LOW (ref 6.0–8.3)

## 2010-11-16 LAB — COMPREHENSIVE METABOLIC PANEL
AST: 57 U/L — ABNORMAL HIGH (ref 0–37)
Albumin: 3.6 g/dL (ref 3.5–5.2)
BUN: 21 mg/dL (ref 6–23)
Calcium: 8.1 mg/dL — ABNORMAL LOW (ref 8.4–10.5)
Chloride: 107 mEq/L (ref 96–112)
Creatinine, Ser: 1.84 mg/dL — ABNORMAL HIGH (ref 0.4–1.2)
GFR calc Af Amer: 33 mL/min — ABNORMAL LOW (ref >60)
GFR calc non Af Amer: 28 mL/min — ABNORMAL LOW (ref >60)
Total Bilirubin: 0.4 mg/dL (ref 0.3–1.2)

## 2010-11-16 LAB — PROTIME-INR
INR: 1.21 (ref 0.00–1.49)
Prothrombin Time: 14.1 seconds (ref 11.6–15.2)
Prothrombin Time: 15.2 s (ref 11.6–15.2)

## 2010-11-16 LAB — DIFFERENTIAL
Basophils Absolute: 0 10*3/uL (ref 0.0–0.1)
Basophils Absolute: 0 10*3/uL (ref 0.0–0.1)
Basophils Relative: 0 % (ref 0–1)
Eosinophils Absolute: 0.1 10*3/uL (ref 0.0–0.7)
Eosinophils Relative: 1 % (ref 0–5)
Eosinophils Relative: 4 % (ref 0–5)
Lymphocytes Relative: 52 % — ABNORMAL HIGH (ref 12–46)
Lymphs Abs: 1.4 10*3/uL (ref 0.7–4.0)
Lymphs Abs: 2.5 10*3/uL (ref 0.7–4.0)
Monocytes Absolute: 0.4 10*3/uL (ref 0.1–1.0)
Neutro Abs: 1.7 10*3/uL (ref 1.7–7.7)
Neutrophils Relative %: 76 % (ref 43–77)

## 2010-11-16 LAB — COMPREHENSIVE METABOLIC PANEL WITH GFR
ALT: 27 U/L (ref 0–35)
AST: 33 U/L (ref 0–37)
Albumin: 2.8 g/dL — ABNORMAL LOW (ref 3.5–5.2)
Alkaline Phosphatase: 59 U/L (ref 39–117)
BUN: 22 mg/dL (ref 6–23)
CO2: 19 meq/L (ref 19–32)
Calcium: 7.2 mg/dL — ABNORMAL LOW (ref 8.4–10.5)
Chloride: 116 meq/L — ABNORMAL HIGH (ref 96–112)
Creatinine, Ser: 1.26 mg/dL — ABNORMAL HIGH (ref 0.4–1.2)
GFR calc non Af Amer: 43 mL/min — ABNORMAL LOW
Glucose, Bld: 122 mg/dL — ABNORMAL HIGH (ref 70–99)
Potassium: 4.2 meq/L (ref 3.5–5.1)
Sodium: 138 meq/L (ref 135–145)
Total Bilirubin: 0.5 mg/dL (ref 0.3–1.2)
Total Protein: 5.3 g/dL — ABNORMAL LOW (ref 6.0–8.3)

## 2010-11-16 LAB — POCT I-STAT 3, ART BLOOD GAS (G3+)
Bicarbonate: 20.3 mEq/L (ref 20.0–24.0)
Patient temperature: 35.3
pH, Arterial: 7.388 (ref 7.350–7.400)

## 2010-11-16 LAB — MRSA PCR SCREENING: MRSA by PCR: NEGATIVE

## 2010-11-16 LAB — HEPARIN LEVEL (UNFRACTIONATED): Heparin Unfractionated: 0.53 IU/mL (ref 0.30–0.70)

## 2010-11-16 LAB — TSH: TSH: 10.297 u[IU]/mL — ABNORMAL HIGH (ref 0.350–4.500)

## 2010-11-16 LAB — URINE MICROSCOPIC-ADD ON

## 2010-11-16 LAB — TROPONIN I: Troponin I: 0.06 ng/mL (ref 0.00–0.06)

## 2010-11-16 LAB — APTT: aPTT: 25 seconds (ref 24–37)

## 2010-12-03 ENCOUNTER — Ambulatory Visit (INDEPENDENT_AMBULATORY_CARE_PROVIDER_SITE_OTHER): Payer: Medicare Other | Admitting: *Deleted

## 2010-12-03 DIAGNOSIS — Z95810 Presence of automatic (implantable) cardiac defibrillator: Secondary | ICD-10-CM

## 2010-12-03 DIAGNOSIS — I428 Other cardiomyopathies: Secondary | ICD-10-CM

## 2010-12-03 DIAGNOSIS — R0989 Other specified symptoms and signs involving the circulatory and respiratory systems: Secondary | ICD-10-CM

## 2010-12-06 LAB — CBC
HCT: 36.2 % (ref 36.0–46.0)
HCT: 37.7 % (ref 36.0–46.0)
HCT: 44.7 % (ref 36.0–46.0)
Hemoglobin: 11.9 g/dL — ABNORMAL LOW (ref 12.0–15.0)
MCHC: 34.2 g/dL (ref 30.0–36.0)
MCHC: 35.3 g/dL (ref 30.0–36.0)
MCV: 92.5 fL (ref 78.0–100.0)
MCV: 93 fL (ref 78.0–100.0)
MCV: 93.4 fL (ref 78.0–100.0)
Platelets: 179 10*3/uL (ref 150–400)
Platelets: 230 10*3/uL (ref 150–400)
RBC: 3.67 MIL/uL — ABNORMAL LOW (ref 3.87–5.11)
RBC: 4.05 MIL/uL (ref 3.87–5.11)
RDW: 15 % (ref 11.5–15.5)
WBC: 5.2 10*3/uL (ref 4.0–10.5)
WBC: 6.2 10*3/uL (ref 4.0–10.5)
WBC: 8.8 10*3/uL (ref 4.0–10.5)

## 2010-12-06 LAB — LIPID PANEL
Cholesterol: 269 mg/dL — ABNORMAL HIGH (ref 0–200)
HDL: 70 mg/dL (ref >39)
LDL Cholesterol: 117 mg/dL — ABNORMAL HIGH (ref 0–99)
LDL Cholesterol: 159 mg/dL — ABNORMAL HIGH (ref 0–99)
Total CHOL/HDL Ratio: 2.9 RATIO
Triglycerides: 91 mg/dL (ref <150)
VLDL: 18 mg/dL (ref 0–40)

## 2010-12-06 LAB — DIFFERENTIAL
Basophils Absolute: 0 10*3/uL (ref 0.0–0.1)
Eosinophils Absolute: 0 10*3/uL (ref 0.0–0.7)
Eosinophils Relative: 0 % (ref 0–5)
Lymphs Abs: 1.2 10*3/uL (ref 0.7–4.0)
Neutrophils Relative %: 82 % — ABNORMAL HIGH (ref 43–77)

## 2010-12-06 LAB — BASIC METABOLIC PANEL
BUN: 13 mg/dL (ref 6–23)
BUN: 14 mg/dL (ref 6–23)
BUN: 17 mg/dL (ref 6–23)
CO2: 25 mEq/L (ref 19–32)
CO2: 27 mEq/L (ref 19–32)
CO2: 28 mEq/L (ref 19–32)
Calcium: 8.1 mg/dL — ABNORMAL LOW (ref 8.4–10.5)
Calcium: 8.4 mg/dL (ref 8.4–10.5)
Calcium: 8.8 mg/dL (ref 8.4–10.5)
Chloride: 105 mEq/L (ref 96–112)
Chloride: 108 mEq/L (ref 96–112)
Chloride: 96 mEq/L (ref 96–112)
Creatinine, Ser: 1.45 mg/dL — ABNORMAL HIGH (ref 0.4–1.2)
Creatinine, Ser: 1.5 mg/dL — ABNORMAL HIGH (ref 0.4–1.2)
Creatinine, Ser: 1.52 mg/dL — ABNORMAL HIGH (ref 0.4–1.2)
Creatinine, Ser: 1.54 mg/dL — ABNORMAL HIGH (ref 0.4–1.2)
GFR calc Af Amer: 42 mL/min — ABNORMAL LOW (ref >60)
Glucose, Bld: 103 mg/dL — ABNORMAL HIGH (ref 70–99)
Glucose, Bld: 121 mg/dL — ABNORMAL HIGH (ref 70–99)
Sodium: 137 mEq/L (ref 135–145)

## 2010-12-06 LAB — PROTIME-INR
INR: 0.9 (ref 0.00–1.49)
INR: 1.2 (ref 0.00–1.49)
Prothrombin Time: 12.7 seconds (ref 11.6–15.2)
Prothrombin Time: 15.4 seconds — ABNORMAL HIGH (ref 11.6–15.2)

## 2010-12-06 LAB — CARDIAC PANEL(CRET KIN+CKTOT+MB+TROPI)
CK, MB: 2.5 ng/mL (ref 0.3–4.0)
CK, MB: 277.2 ng/mL — ABNORMAL HIGH (ref 0.3–4.0)
Total CK: 461 U/L — ABNORMAL HIGH (ref 7–177)
Troponin I: 30.47 ng/mL (ref 0.00–0.06)
Troponin I: >100 ng/mL (ref 0.00–0.06)

## 2010-12-06 LAB — APTT
aPTT: 28 seconds (ref 24–37)
aPTT: 44 seconds — ABNORMAL HIGH (ref 24–37)

## 2010-12-06 LAB — POCT I-STAT, CHEM 8
HCT: 44 % (ref 36.0–46.0)
Hemoglobin: 15 g/dL (ref 12.0–15.0)
Potassium: 3.2 mEq/L — ABNORMAL LOW (ref 3.5–5.1)
Sodium: 138 mEq/L (ref 135–145)
TCO2: 24 mmol/L (ref 0–100)

## 2010-12-06 LAB — TSH: TSH: 6.68 u[IU]/mL — ABNORMAL HIGH (ref 0.350–4.500)

## 2010-12-07 ENCOUNTER — Other Ambulatory Visit: Payer: Self-pay

## 2010-12-07 NOTE — Progress Notes (Signed)
icd remote check  

## 2010-12-10 LAB — COMPREHENSIVE METABOLIC PANEL
ALT: 9 U/L (ref 0–35)
Alkaline Phosphatase: 47 U/L (ref 39–117)
CO2: 22 mEq/L (ref 19–32)
Chloride: 105 mEq/L (ref 96–112)
GFR calc non Af Amer: 32 mL/min — ABNORMAL LOW (ref >60)
Glucose, Bld: 106 mg/dL — ABNORMAL HIGH (ref 70–99)
Potassium: 3.6 mEq/L (ref 3.5–5.1)
Sodium: 138 mEq/L (ref 135–145)
Total Bilirubin: 0.9 mg/dL (ref 0.3–1.2)

## 2010-12-10 LAB — BRAIN NATRIURETIC PEPTIDE: Pro B Natriuretic peptide (BNP): 693 pg/mL — ABNORMAL HIGH (ref 0.0–100.0)

## 2010-12-10 LAB — TROPONIN I: Troponin I: 0.01 ng/mL (ref 0.00–0.06)

## 2010-12-10 LAB — URINE MICROSCOPIC-ADD ON

## 2010-12-10 LAB — CK TOTAL AND CKMB (NOT AT ARMC): Relative Index: INVALID (ref 0.0–2.5)

## 2010-12-10 LAB — LIPID PANEL: Cholesterol: 248 mg/dL — ABNORMAL HIGH (ref 0–200)

## 2010-12-10 LAB — URINALYSIS, ROUTINE W REFLEX MICROSCOPIC
Nitrite: NEGATIVE
Protein, ur: NEGATIVE mg/dL
Specific Gravity, Urine: 1.019 (ref 1.005–1.030)
Urobilinogen, UA: 1 mg/dL (ref 0.0–1.0)

## 2010-12-10 LAB — CBC
MCHC: 33.9 g/dL (ref 30.0–36.0)
MCV: 94.1 fL (ref 78.0–100.0)
RBC: 4.99 MIL/uL (ref 3.87–5.11)

## 2010-12-10 LAB — TSH: TSH: 30.286 u[IU]/mL — ABNORMAL HIGH (ref 0.350–4.500)

## 2010-12-20 ENCOUNTER — Encounter: Payer: Self-pay | Admitting: *Deleted

## 2011-01-12 NOTE — Discharge Summary (Signed)
Crystal Duran, Crystal Duran             ACCOUNT NO.:  [id number]   MEDICAL RECORD NO.:  [id number]          PATIENT TYPE:  OBV   LOCATION:  4705                         FACILITY:  [facility]   PHYSICIAN:  Ricki Rodriguez, M.D.  DATE OF BIRTH:  09/22/1945   DATE OF ADMISSION:  08/16/2008  DATE OF DISCHARGE:  08/18/2008                               DISCHARGE SUMMARY   REFERRING PHYSICIAN:  Osvaldo Shipper. Spruill, M.D.   FINAL DIAGNOSES:  1. Positional vertigo.  2. Dehydration.  3. Hypertension.  4. Hypothyroidism.  5. Migraine headache.   DISCHARGE MEDICATIONS:  1. Cipro 500 mg one twice daily x5 days.  2. Meclizine 25 mg 3 times daily as needed.  3. Prilosec 20 mg one daily.  4. Potassium 10 mEq daily.  5. Calcium 600 mg plus vitamin D one daily from Monday.  6. Atenolol 25 mg half daily from Monday.  7. Norvasc 5 mg half tablet daily from Monday.  8. Synthroid 0.125 mg daily.  9. Hydrochlorothiazide 25 mg half tablet daily.   DISCHARGE DIET:  Low-sodium heart-healthy diet.   DISCHARGE ACTIVITIES:  The patient to increase activity slowly.   SPECIAL INSTRUCTION:  The patient is to stop any activity that causes  chest pain, shortness of breath, dizziness, sweating, or excessive  weakness and the patient to use walker for 4 or 5 days as needed.   Followed by Dr. Donia Guiles in 2 to 4 weeks.  The patient to call [phone] for appointment.   HISTORY:  This 65 year old black female presented with 1-week history of  nausea and dizziness along with vomiting.  The patient had history of  ear infections in the past, denies chest pain or fever, admits to the  urinary frequency and urgency.   PHYSICAL EXAMINATION:  VITAL SIGNS:  Pulse 82, respirations 16, and  blood pressure 156/98.  HEENT:  The patient is normocephalic and atraumatic.  She has brown  eyes.  Pupils equally reacting to light.  Extraocular movements intact.  NECK:  No JVD.  LUNGS:  Clear bilaterally.  HEART:  Normal S1 and  S2.  EXTREMITIES:  No edema.   LABORATORY DATA:  Normal hemoglobin, hematocrit, WBC count, and platelet  count.  INR 1.0.  Urinalysis suggestive of small leukocyte with  microscopic examination showing 3-6 wbc's and 3-6 rbc's.  Sodium 140 and  potassium 4.5.  BUN 20 and creatinine 1.3.   HOSPITAL COURSE:  The patient was placed in observation.  She was  started on IV fluids.  Her antihypertensive medications were held.  She  was given meclizine for positional vertigo and Cipro for urinary tract  infection.  Her condition improved in less than 48 hours and she was  able to ambulate holding the IV pole.  Hence, she was discharged home on  August 18, 2008, in satisfactory condition with followup by primary  care physician in 2-4 weeks.      Ricki Rodriguez, M.D.  Electronically Signed     ASK/MEDQ  D:  08/18/2008  T:  08/18/2008  Job:  [id number]

## 2011-01-12 NOTE — Cardiovascular Report (Signed)
Crystal Duran, FINCHER             ACCOUNT NO.:  [id number]   MEDICAL RECORD NO.:  [id number]          PATIENT TYPE:  INP   LOCATION:  2904                         FACILITY:  [facility]   PHYSICIAN:  Eduardo Osier. Sharyn Lull, M.D. DATE OF BIRTH:  04-30-1946   DATE OF PROCEDURE:  02/27/2009  DATE OF DISCHARGE:                            CARDIAC CATHETERIZATION   PROCEDURE:  1. Left cardiac catheterization with selective left and right coronary      angiography, left ventriculography via right groin using Judkins      technique.  2. Insertion of temporary transvenous pacemaker via right femoral      venous approach to right ventricular apex.  3. Successful percutaneous  transluminal coronary angioplasty to      proximal and mid right coronary artery using 2.5 x 15-mm long      Voyager balloon.  4. Successful deployment of 3.5 x 28-mm long Xience V stent in      proximal and mid right coronary artery.  5. Successful post dilatation of this Xience stent using 3.75 x 15-mm      long [place] Voyager balloon.   INDICATIONS FOR PROCEDURE:  Crystal Duran is a 65 year old black female  with past medical history significant for hypertension, hypothyroidism,  history of tobacco abuse, strong family history of coronary artery  disease, and noncompliant to medication and followup.  She came to the  ER complaining of retrosternal chest pain off and on since last week.  Last night, she woke up around 12 o'clock with chest pressure, grade  10/10, radiating to both arms, associated with nausea and mild shortness  of breath.  She did not seek any medical attention until this a.m.  EKG  done in the ER showed sinus tachycardia with Q-wave in lead III with ST  elevation in inferior leads and reciprocal ST changes in lateral leads.  States she thought this chest pain initially is from acid reflux so did  not seek any medical attention but as the pain got worse, so decided to  come to the ER.  Denies such episodes in the  past.  I discussed with the  patient at length regarding EKG changes, regarding emergency left cath,  possible PTCA and stenting, its risks and benefits, i.e., death, MI,  stroke, need for emergency CABG, risk of restenosis, local vascular  complications, etc. and consented for the procedure.   PROCEDURE:  After obtaining the informed consent, the patient was  brought to the cath lab and was placed on fluoroscopy table.  Right  groin was prepped and draped in usual fashion.  Xylocaine 1% was used  for local anesthesia in the right groin.  With the help of thin-wall  needle, 6-French arterial and venous sheaths were placed.  Both the  sheaths were aspirated and flushed.  Next, a 6-French left Judkins  catheter was advanced over the wire under fluoroscopic guidance up to  the ascending aorta.  Wire was pulled out.  The catheter was aspirated  and connected to the manifold.  Catheter was further advanced and  engaged into left coronary ostium.  Multiple views of the  left system  were taken.  Next, the catheter was disengaged and was pulled out over  the wire and was replaced with 6-French right guiding catheter which was  advanced over the wire under fluoroscopic guidance up to the ascending  aorta.  Wire was pulled out.  The catheter was aspirated and connected  to the manifold.  Catheter was further advanced and engaged into right  coronary ostium.  Multiple views of the right system were taken.  Next,  the catheter was disengaged at the end of the procedure and was replaced  with 6-French pigtail catheter which was advanced over the wire under  fluoroscopic guidance up to the ascending aorta.  Wire was pulled out.  The catheter was aspirated and connected to the manifold.  Catheter was  further advanced across the aortic valve into the LV.  LV pressures were  recorded.  Next, LV graph was done in 30-degree RAO position.  Post-  angiographic pressures were recorded from LV and then  pullback pressures  were recorded from the aorta.  There was no gradient across the aortic  valve.  Next, the pigtail catheter was pulled out over the wire.  Sheaths were aspirated and flushed.   FINDINGS:  LV was moderately enlarged.  There was inferior wall severe  hypokinesia, EF of 20-25%.  Left main has 5-10% distal stenosis.  LAD  has 5-10% ostial and 15-20% proximal and mid stenosis.  Diagonal 1 and 2  are very very small.  Diagonal 3 has 30% ostial stenosis which was of  moderate size.  Diagonal 4 was very very small.  Left circumflex has 5-  10% proximal stenosis.  OM1 is very small.  OM 2 and OM 3 were very very  small.  RCA was 100% occluded proximally which was filling distally from  collaterals from the left system.   Temporary transvenous pacemaker was inserted via right femoral venous  approach up to RV apex prior to PCI without difficulty which was  discontinued at the end of the procedure.   INTERVENTIONAL PROCEDURE:  Successful PTCA to proximal and mid RCA was  done using 2.5 x 15-mm long Voyager balloon for predilatation and then  3.5 x 28-mm long Xience V stent was deployed at 11 atmospheric pressure  in proximal and mid portion of RCA.  Stent was postdilated using 3.75 x  15-mm long [place] Voyager balloon going up to 18 to 20 atmospheric pressure.  Lesion dilated from 100% to 0% residual and with excellent TIMI grade 3  distal flow without evidence of dissection or distal embolization.  The  patient received weight-based Angiomax, 60 mg of prasugrel, IC nitro, IC  Nipride, IV Lopressor 5 mg x3, and IV labetalol 10 mg x2 during the  procedure.  The patient tolerated the procedure well.  There were no  complications.  Temporary transvenous pacemaker wire was discontinued at  the end of the procedure.  Door-to-balloon time in the cath lab  including transvenous pacemaker prior to the PCI was 30 minutes.  The  patient tolerated the procedure well.  There were no  complications.  The  patient was transferred to recovery room in stable condition.      Eduardo Osier. Sharyn Lull, M.D.  Electronically Signed     MNH/MEDQ  D:  02/27/2009  T:  02/28/2009  Job:  [id number]   cc:   Catheterization Lab  Osvaldo Shipper. Spruill, M.D.

## 2011-01-12 NOTE — Discharge Summary (Signed)
Crystal Duran, Crystal Duran             ACCOUNT NO.:  [id number]   MEDICAL RECORD NO.:  [id number]          PATIENT TYPE:  INP   LOCATION:  2013                         FACILITY:  [facility]   PHYSICIAN:  Crystal Duran. Crystal Duran, M.D. DATE OF BIRTH:  September 25, 1945   DATE OF ADMISSION:  02/27/2009  DATE OF DISCHARGE:  03/03/2009                               DISCHARGE SUMMARY   ADMITTING DIAGNOSES:  1. Acute inferior wall myocardial infarction.  2. Hypertensive emergency.  3. Hypertension.  4. Hypothyroidism.  5. History of tobacco abuse.  6. Positive family history of coronary artery disease.   DISCHARGE DIAGNOSES:  1. Status post acute inferior wall myocardial infarction, status post      percutaneous transluminal coronary angioplasty stenting to 100%      occluded right carotid artery.  2. Status post hypertensive emergency.  3. Hypertensive heart disease with systolic dysfunction.  4. Dilated cardiomyopathy.  5. Non-insulin-dependent diabetes mellitus, controlled by diet.  6. Hypercholesterolemia.  7. Tobacco abuse.  8. Hypothyroidism.  9. Positive family history of coronary artery disease.   DISCHARGE HOME MEDICATIONS:  1. Enteric-coated aspirin 325 mg 1 tablet daily.  2. Prasugrel 10 mg 1 tablet daily.  3. Coreg 25 mg 1 tablet every 12 hours.  4. Ramipril 10 mg 1 capsule twice daily.  5. Crestor 20 mg 1 tablet daily.  6. Nitrostat 0.4 mg sublingual use as directed.  7. Levothyroxine 0.125 mg 1 tablet daily as before.   DIET:  Low salt and low cholesterol.  The patient has been advised to  avoid sweets.   Post PTCA stent instructions have been given.  No driving, lifting,  pushing, or pulling for 1 week and increase activity slowly as  tolerated.  The patient is scheduled for phase II cardiac rehab as  outpatient.  Followup with me in 1 week and Crystal Duran as scheduled.   CONDITION ON DISCHARGE:  Stable.   BRIEF HISTORY AND HOSPITAL COURSE:  Crystal Duran is a 65 year old black  female with past medical history significant for hypertension,  hypothyroidism, history of tobacco abuse, strong family history of  coronary artery disease, noncompliant to medication.  She came to the  ER, complaining of retrosternal chest pain off and on since last 1 week.  Last night, she woke up with chest pressure, grade 10/10, radiating to  both arms, associated with nausea, did not seek any medical attention  until this a.m.  EKG done in the ER showed sinus tach with Q-waves in  lead III with ST elevation in the inferior leads and reciprocal changes  in lateral leads.  The patient thought the chest pain initially is from  acid reflux so did not seek any medical attention and as pain got worse,  so decided to come to ER.  Denies any such episodes of chest pain in the  past.   PAST MEDICAL HISTORY:  As above.   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   ALLERGY:  No known drug allergies.   MEDICATIONS AT HOME:  She is  on questionable blood pressure and thyroid  medication.   SOCIAL HISTORY:  She is married, retired Runner, broadcasting/film/video, smoked half pack per  day for 10 years, quit 10 years ago and no history of alcohol or drug  abuse.   FAMILY HISTORY:  Positive for coronary artery disease.   PHYSICAL EXAMINATION:  GENERAL:  She is alert, awake, and oriented x3 in  no acute distress.  VITAL SIGNS:  Blood pressure was 204/104, pulse was 115, sinus tach.  EYES:  Conjunctivae pink.  NECK:  Supple.  No JVD.  LUNGS:  Decreased breath sounds at bases.  CARDIOVASCULAR:  S1 and S2 normal.  There was soft S3 and S4 gallop.  ABDOMEN:  Soft.  Bowel sounds are present, nontender.  EXTREMITIES:  There is no clubbing, cyanosis, or edema.   LABORATORY DATA:  CK was 3558, MB 277.2, relative index 7.8, troponin I  was above 96.  Repeat cardiac enzymes, CK was 2901 and BUN 54, relative  index 5.7, troponin I was more than 100.   Repeat cardiac enzymes on March 01, 2009, CK 1015, MB 15.5, relative index 1.5, which has been trending  down.  Troponin I was 59.13.  Repeat cardiac enzymes on March 02, 2009, CK  461, MB 4.2, relative index 0.9, troponin I is down to 30.47.  Today,  her cardiac enzymes, CK 286, MB 2.5, relative index 0.9, troponin I is  down to 20.24.  CBC, hemoglobin was 12.8, hematocrit 36.2, white count  of 6.2.  Sodium 130, potassium 3.4, BUN 13, creatinine 1.52, glucose was  139.  Repeat BNP today is sodium 137, potassium 3.9, glucose fasting is  103, BUN 21, creatinine 1.45.  Her cholesterol was 205, LDL 117, HDL was  70, triglycerides 191.  TSH was 30.28.   BRIEF HOSPITAL COURSE:  The patient was transferred from ED directly to  the cath lab and underwent emergency left cath and PTCA stenting to 100%  occluded RCA as per procedure report.  The patient tolerated the  procedure well.  There were no complications.  The patient did have any  episodes of chest pain during the hospital stay.  Her groin is stable  with no evidence of hematoma or bruit.  Phase I and II cardiac rehab was  called.  The patient has been ambulating in hallway without any  problems.  Groin is stable with no evidence of hematoma or bruit.  The  patient will be discharged home on above medications and will be  followed up in my office in 1 week  and Crystal Duran has scheduled.  The patient will be restarted on her  levothyroxine as before.  The patient states she has not been taking her  medication regularly.  We will follow her thyroid function closely as  outpatient and the patient did schedule for phase II cardiac rehab as  outpatient.      Crystal Duran. Crystal Duran, M.D.  Electronically Signed     [facility]  D:  03/03/2009  T:  03/04/2009  Job:  [id number]   cc:   Crystal Duran, M.D.

## 2011-02-02 ENCOUNTER — Encounter: Payer: Self-pay | Admitting: Internal Medicine

## 2011-03-05 ENCOUNTER — Encounter: Payer: Self-pay | Admitting: Internal Medicine

## 2011-03-05 ENCOUNTER — Ambulatory Visit (INDEPENDENT_AMBULATORY_CARE_PROVIDER_SITE_OTHER): Payer: Medicare Other | Admitting: Internal Medicine

## 2011-03-05 DIAGNOSIS — I2589 Other forms of chronic ischemic heart disease: Secondary | ICD-10-CM

## 2011-03-05 DIAGNOSIS — Z95810 Presence of automatic (implantable) cardiac defibrillator: Secondary | ICD-10-CM

## 2011-03-05 DIAGNOSIS — I5022 Chronic systolic (congestive) heart failure: Secondary | ICD-10-CM | POA: Insufficient documentation

## 2011-03-05 DIAGNOSIS — I509 Heart failure, unspecified: Secondary | ICD-10-CM

## 2011-03-05 LAB — ICD DEVICE OBSERVATION
DEV-0020ICD: NEGATIVE
DEVICE MODEL ICD: 608161
HV IMPEDENCE: 82 Ohm
RV LEAD IMPEDENCE ICD: 437.5 Ohm
TOT-0008: 0
TOT-0009: 1
TOT-0010: 7
TZAT-0001SLOWVT: 1
TZAT-0004SLOWVT: 8
TZAT-0012SLOWVT: 200 ms
TZAT-0013SLOWVT: 1
TZAT-0018SLOWVT: NEGATIVE
TZAT-0019SLOWVT: 7.5 V
TZAT-0020SLOWVT: 1 ms
TZON-0003SLOWVT: 300 ms
TZON-0004SLOWVT: 40
TZON-0005SLOWVT: 6
TZST-0001SLOWVT: 3
TZST-0001SLOWVT: 4
TZST-0003SLOWVT: 40 J
TZST-0003SLOWVT: 40 J
VENTRICULAR PACING ICD: 0 pct

## 2011-03-05 NOTE — Assessment & Plan Note (Signed)
The patient's device was interrogated.  The information was reviewed. No changes were made in the programming.    

## 2011-03-05 NOTE — Assessment & Plan Note (Signed)
Stable  I will ask her to followup with Dr Sharyn Lull about ACE/ARB therapy

## 2011-03-05 NOTE — Assessment & Plan Note (Signed)
No recurrent ventricular arrhythmia 

## 2011-03-05 NOTE — Progress Notes (Signed)
  HPI  Crystal Duran is a 65 y.o. female seen following ICD implantation in June 2011 reported cardiac arrest occurring in the context of a previous myocardial infarction and catheterization demonstrating mostly in residual nonobstructive disease. Ejection fraction was 20-25%. Was felt her cardiomyopathy was out of proportion to her coronary disease. She has class II congestive failure with shortness of breaath with modest exertion The patient denies chest pain nocturnal dyspnea, orthopnea or peripheral edema.  There have been no palpitations, lightheadedness or syncope.   She was telling me about grandhchildren that she is raising   Past Medical History  Diagnosis Date  . Cardiac arrest - ventricular fibrillation     aborted- 2011  . 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 Jude Fortify ICD 123-40Q  . Systolic heart failure     compensated  . CAD (coronary artery disease)   . MI (myocardial infarction)     Hx of inferior wall MI   . S/P cardiac catheterization     s/p left cateterization with patent coronary arteries  . Ischemic cardiomyopathy     severe  . HTN (hypertension)   . Hypercholesteremia   . Hypothyroidism   . History of tobacco abuse   . Mild renal insufficiency     Past Surgical History  Procedure Date  . Defibrillator implantation     single chamber. [employer] Jude Fortify ICD [id number]. Remote- no.   [name] Unspecified area hysterectomy     Current Outpatient Prescriptions  Medication Sig Dispense Refill  . aspirin 81 MG tablet Take 81 mg by mouth daily.        . carvedilol (COREG) 25 MG tablet Take 25 mg by mouth 2 (two) times daily.        . furosemide (LASIX) 40 MG tablet Take 40 mg by mouth 2 (two) times daily.        . isosorbide mononitrate (IMDUR) 120 MG 24 hr tablet Take 120 mg by mouth daily.        [name] levothyroxine (SYNTHROID, LEVOTHROID) 125 MCG tablet Take 125 mcg by mouth daily.        [name] losartan (COZAAR) 50 MG  tablet Take 50 mg by mouth daily.        . pantoprazole (PROTONIX) 40 MG tablet Take 40 mg by mouth daily.        . prasugrel (EFFIENT) 10 MG TABS Take 10 mg by mouth daily.        . ramipril (ALTACE) 10 MG capsule Take 10 mg by mouth daily.       . rosuvastatin (CRESTOR) 20 MG tablet Take 20 mg by mouth daily.        [name] spironolactone (ALDACTONE) 25 MG tablet Take 25 mg by mouth daily.          No Known Allergies  Review of Systems negative except from HPI and PMH  Physical Exam Well developed and well nourished in no acute distress HENT normal E scleral and icterus clear Neck Supple JVP flat; carotids brisk and full Clear to ausculation Regular rate and rhythm, no murmurs gallops or rub Soft with active bowel sounds No clubbing cyanosis and edema Alert and oriented, grossly normal motor and sensory function Skin Warm and Dry    Assessment and  Plan

## 2011-03-05 NOTE — Assessment & Plan Note (Signed)
As above stable

## 2011-03-05 NOTE — Patient Instructions (Signed)
Your physician wants you to follow-up in: 1 year  You will receive a reminder letter in the mail two months in advance. If you don't receive a letter, please call our office to schedule the follow-up appointment.  Your physician recommends that you continue on your current medications as directed. Please refer to the Current Medication list given to you today.  

## 2011-03-31 DIAGNOSIS — 419620001 Death: Secondary | SNOMED CT

## 2011-03-31 DEATH — deceased

## 2011-06-03 ENCOUNTER — Encounter: Payer: Medicare Other | Admitting: *Deleted

## 2011-06-04 LAB — BASIC METABOLIC PANEL
CO2: 23 mEq/L (ref 19–32)
Chloride: 108 mEq/L (ref 96–112)
Creatinine, Ser: 1.48 mg/dL — ABNORMAL HIGH (ref 0.4–1.2)
GFR calc Af Amer: 43 mL/min — ABNORMAL LOW (ref >60)
Glucose, Bld: 103 mg/dL — ABNORMAL HIGH (ref 70–99)

## 2011-06-04 LAB — URINE MICROSCOPIC-ADD ON

## 2011-06-04 LAB — URINALYSIS, ROUTINE W REFLEX MICROSCOPIC
Glucose, UA: NEGATIVE mg/dL
Ketones, ur: NEGATIVE mg/dL
Protein, ur: 30 mg/dL — AB
Urobilinogen, UA: 0.2 mg/dL (ref 0.0–1.0)

## 2011-06-04 LAB — CBC
Hemoglobin: 13.4 g/dL (ref 12.0–15.0)
Hemoglobin: 14.2 g/dL (ref 12.0–15.0)
MCHC: 32.4 g/dL (ref 30.0–36.0)
MCHC: 32.8 g/dL (ref 30.0–36.0)
MCHC: 33 g/dL (ref 30.0–36.0)
MCV: 95.3 fL (ref 78.0–100.0)
MCV: 96.4 fL (ref 78.0–100.0)
Platelets: 196 10*3/uL (ref 150–400)
Platelets: 198 10*3/uL (ref 150–400)
RBC: 4.3 MIL/uL (ref 3.87–5.11)
RBC: 4.64 MIL/uL (ref 3.87–5.11)
RDW: 16 % — ABNORMAL HIGH (ref 11.5–15.5)
WBC: 7.7 10*3/uL (ref 4.0–10.5)

## 2011-06-04 LAB — DIFFERENTIAL
Basophils Absolute: 0 10*3/uL (ref 0.0–0.1)
Basophils Relative: 0 % (ref 0–1)
Eosinophils Absolute: 0 10*3/uL (ref 0.0–0.7)
Eosinophils Relative: 0 % (ref 0–5)
Monocytes Absolute: 0.2 10*3/uL (ref 0.1–1.0)

## 2011-06-04 LAB — COMPREHENSIVE METABOLIC PANEL
ALT: 8 U/L (ref 0–35)
Albumin: 3.4 g/dL — ABNORMAL LOW (ref 3.5–5.2)
Calcium: 8.5 mg/dL (ref 8.4–10.5)
GFR calc Af Amer: 57 mL/min — ABNORMAL LOW (ref >60)
Glucose, Bld: 124 mg/dL — ABNORMAL HIGH (ref 70–99)
Sodium: 139 mEq/L (ref 135–145)
Total Protein: 6.2 g/dL (ref 6.0–8.3)

## 2011-06-04 LAB — POCT I-STAT, CHEM 8
Calcium, Ion: 0.96 mmol/L — ABNORMAL LOW (ref 1.12–1.32)
Creatinine, Ser: 1.3 mg/dL — ABNORMAL HIGH (ref 0.4–1.2)
Glucose, Bld: 131 mg/dL — ABNORMAL HIGH (ref 70–99)
HCT: 47 % — ABNORMAL HIGH (ref 36.0–46.0)
Hemoglobin: 16 g/dL — ABNORMAL HIGH (ref 12.0–15.0)
TCO2: 22 mmol/L (ref 0–100)

## 2011-06-04 LAB — URINE CULTURE
Colony Count: NO GROWTH
Culture: NO GROWTH

## 2011-06-04 LAB — PROTIME-INR: Prothrombin Time: 13.1 seconds (ref 11.6–15.2)

## 2011-06-07 ENCOUNTER — Encounter: Payer: Self-pay | Admitting: *Deleted

## 2011-06-17 ENCOUNTER — Ambulatory Visit (INDEPENDENT_AMBULATORY_CARE_PROVIDER_SITE_OTHER): Payer: Medicare Other | Admitting: *Deleted

## 2011-06-17 DIAGNOSIS — Z95810 Presence of automatic (implantable) cardiac defibrillator: Secondary | ICD-10-CM

## 2011-06-17 DIAGNOSIS — I428 Other cardiomyopathies: Secondary | ICD-10-CM

## 2011-06-18 ENCOUNTER — Other Ambulatory Visit: Payer: Self-pay | Admitting: Internal Medicine

## 2011-06-18 ENCOUNTER — Encounter: Payer: Self-pay | Admitting: Internal Medicine

## 2011-06-18 LAB — REMOTE ICD DEVICE
BRDY-0002RV: 40 {beats}/min
BRDY-0005RV: 30 {beats}/min
DEV-0020ICD: NEGATIVE
DEVICE MODEL ICD: 608161
HV IMPEDENCE: 75 Ohm
RV LEAD AMPLITUDE: 12 mv
RV LEAD IMPEDENCE ICD: 440 Ohm
TZAT-0001SLOWVT: 1
TZAT-0004SLOWVT: 8
TZAT-0012SLOWVT: 200 ms
TZAT-0013SLOWVT: 1
TZAT-0018SLOWVT: NEGATIVE
TZAT-0019SLOWVT: 7.5 v
TZAT-0020SLOWVT: 1 ms
TZON-0003SLOWVT: 300 ms
TZON-0004SLOWVT: 40
TZON-0005SLOWVT: 6
TZON-0010SLOWVT: 40 ms
TZST-0001SLOWVT: 2
TZST-0001SLOWVT: 3
TZST-0001SLOWVT: 4
TZST-0001SLOWVT: 5
TZST-0003SLOWVT: 36 J
TZST-0003SLOWVT: 40 J
TZST-0003SLOWVT: 40 J
TZST-0003SLOWVT: 40 J
VENTRICULAR PACING ICD: 1 pct

## 2011-06-29 ENCOUNTER — Encounter: Payer: Self-pay | Admitting: *Deleted

## 2011-06-29 NOTE — Progress Notes (Signed)
icd remote check  

## 2011-07-01 DIAGNOSIS — 419620001 Death: Secondary | SNOMED CT

## 2011-07-01 DEATH — deceased

## 2011-07-24 ENCOUNTER — Other Ambulatory Visit: Payer: Self-pay | Admitting: Cardiology

## 2011-07-24 IMAGING — CR DG CHEST 1V PORT
1 series · 1 of 1 positions shown · non-contrast
Comparison: 02/28/2009

CLINICAL DATA: Cardiac arrest.  Status post CPR.  Intubation and
central line placement.

PORTABLE CHEST - 1 VIEW

[AP]
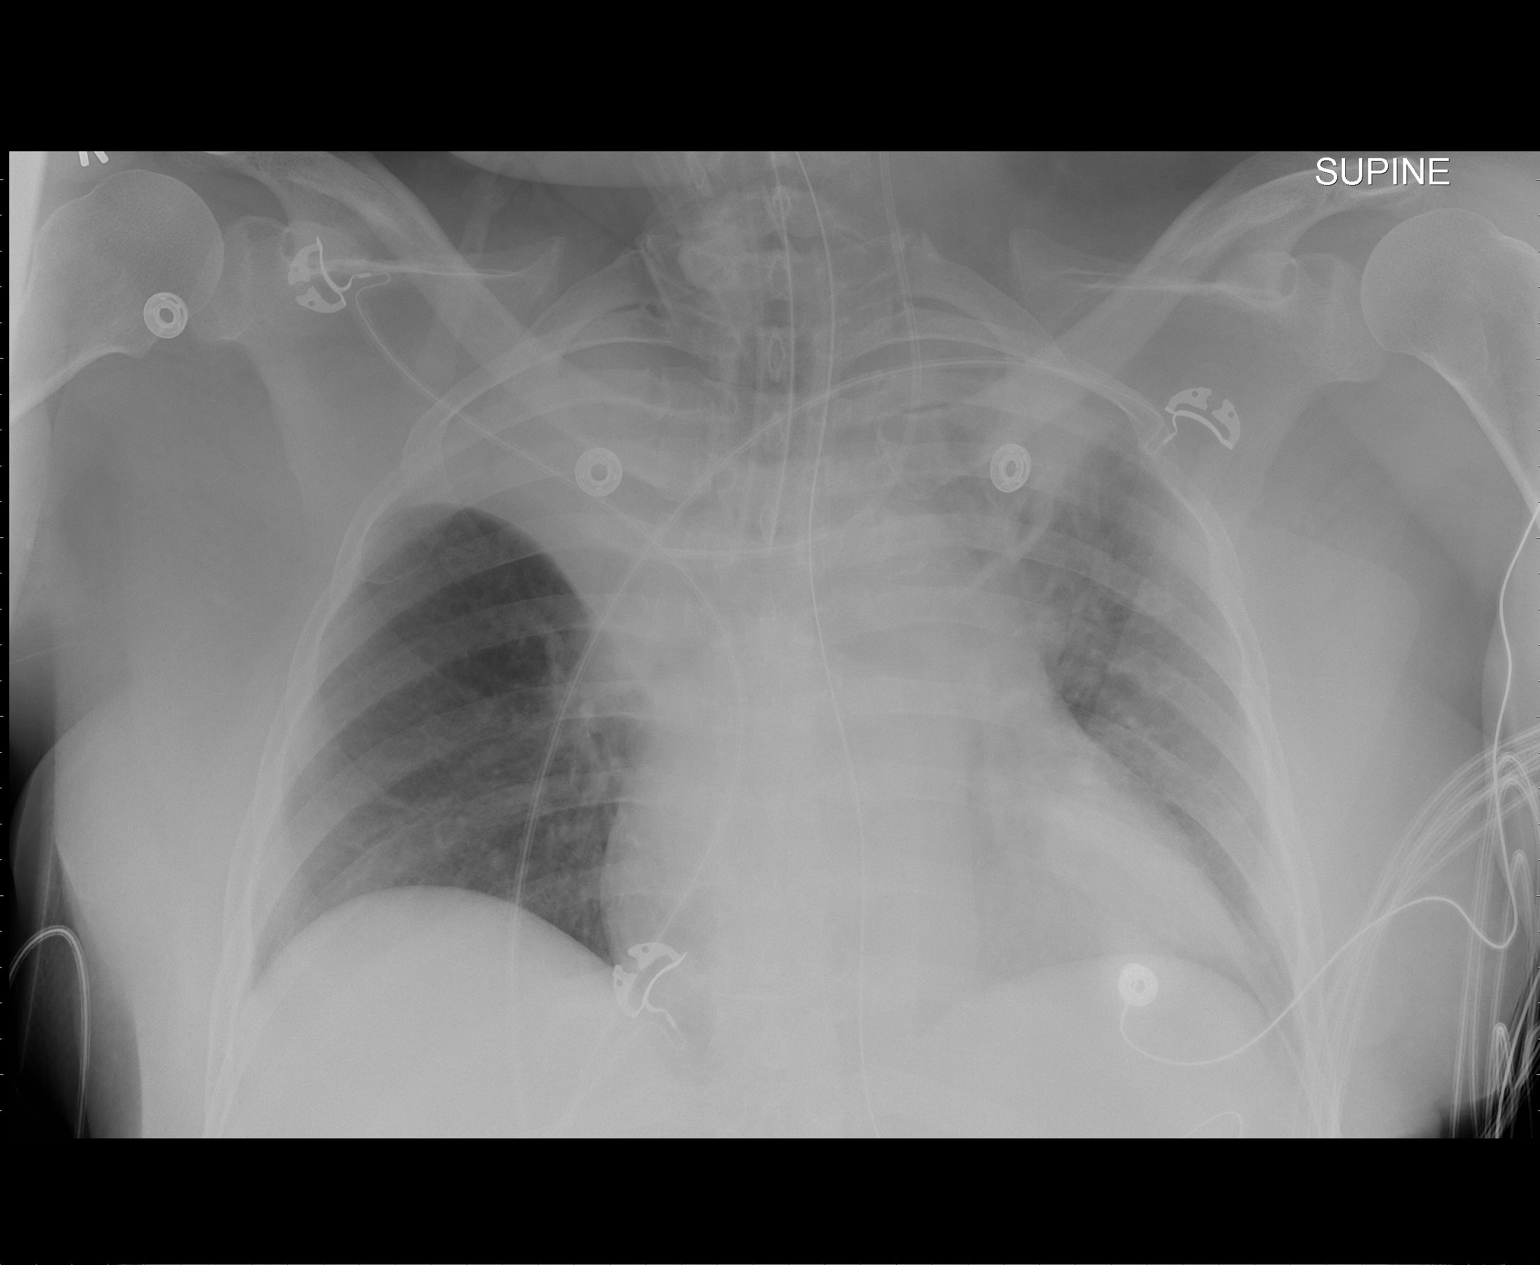

[1 of 1 positions shown; findings below may reference images not displayed]

FINDINGS: Endotracheal tube is seen with tip approximately 1 cm
above the carina.  A left jugular center venous catheter is seen
with tip along the side wall of the superior vena cava.  No
evidence of pneumothorax.  Nasogastric tube is seen within the
stomach.

Right upper lobe collapse is seen, as well as atelectasis or
infiltrate in the left upper lobe.  Lower lung fields are clear.
Heart size is stable.  The
IMPRESSION: 1.  Endotracheal tube tip approximately 1 cm above carina.
2.  Left jugular center venous catheter tip along the lateral side
wall of the superior vena cava.  No evidence of pneumothorax.
3.  Right upper lobe collapse, and left upper lobe atelectasis
versus infiltrate.

## 2011-07-24 IMAGING — CT CT HEAD W/O CM
1 of 2 series · 16 of 30 positions shown, 20 images · non-contrast
Comparison: 08/16/2008

CLINICAL DATA: Cardiac arrest.  Altered mental status.  Previous
surgery for brain tumor.

CT HEAD WITHOUT CONTRAST
TECHNIQUE: Contiguous axial images were obtained from the base of
the skull through the vertex without contrast.

[Series 3: recon 2: brain · axial · 0.49mm/px · z∈[+133,+278]mm · 16 of 64 slices shown, 20 images]
[im 4/64  brain]
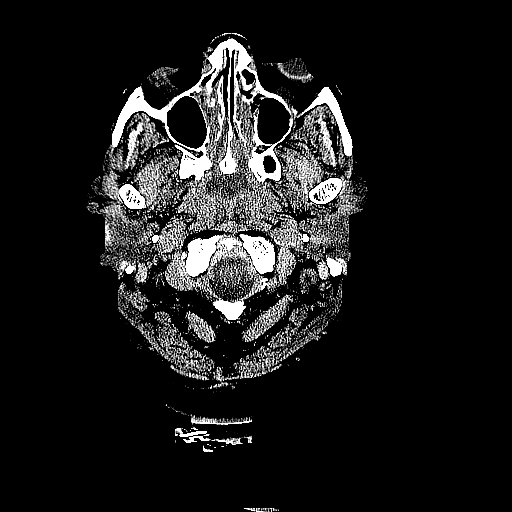
[im 4/64  bone]
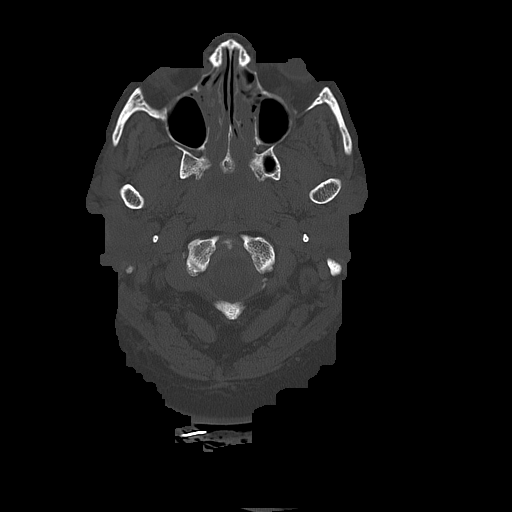
[im 7/64  brain]
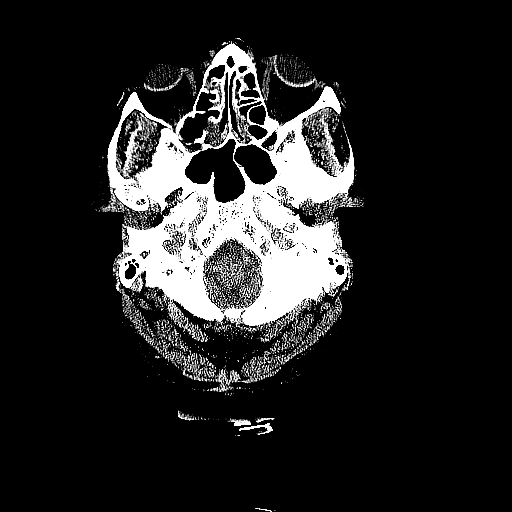
[im 10/64  brain]
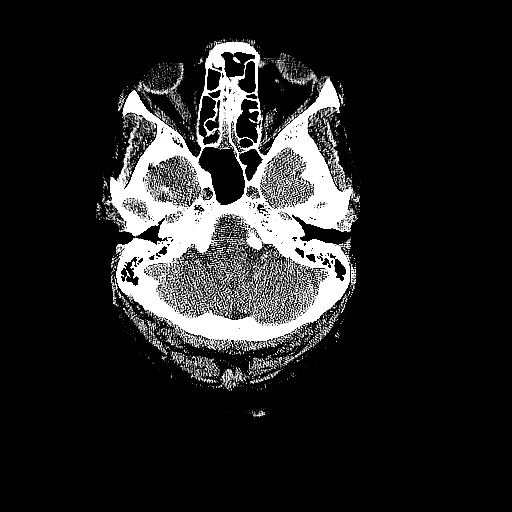
[im 14/64  brain]
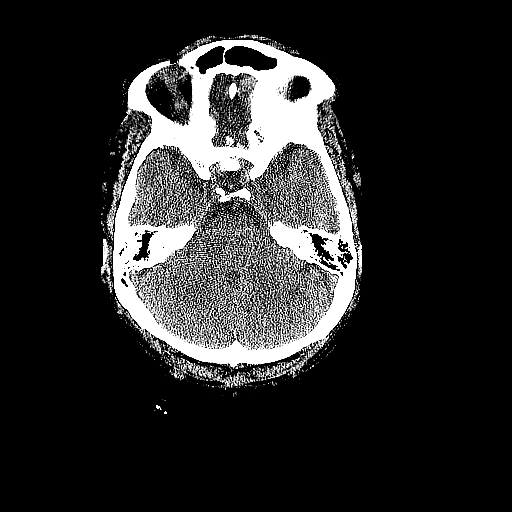
[im 20/64  brain]
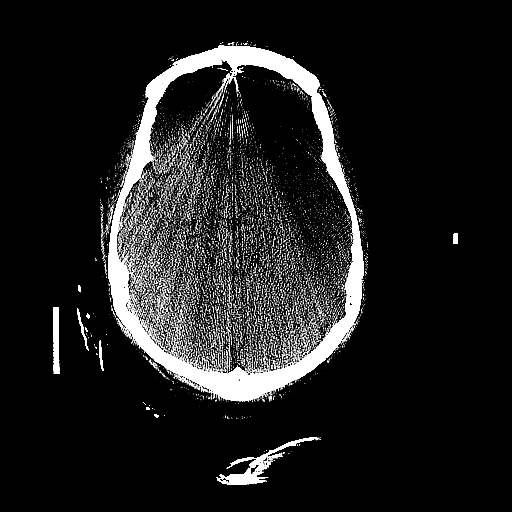
[im 20/64  bone]
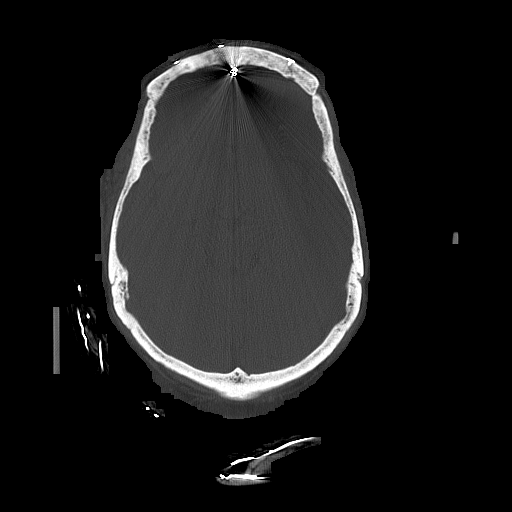
[im 24/64  brain]
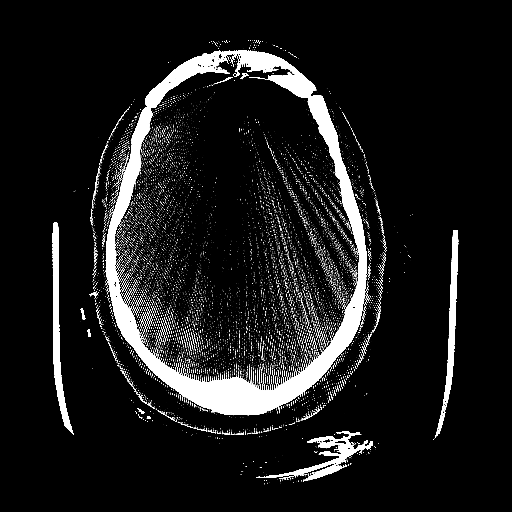
[im 27/64  brain]
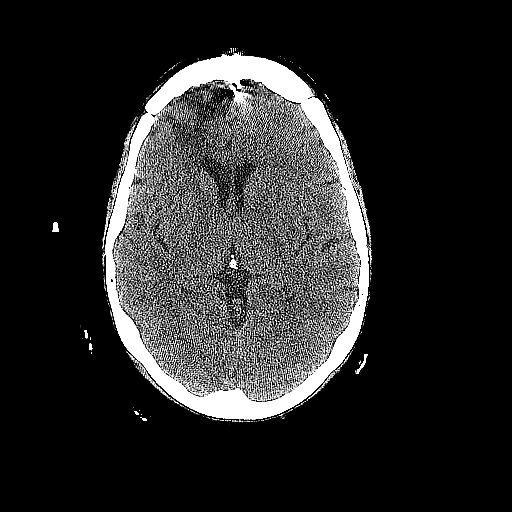
[im 30/64  brain]
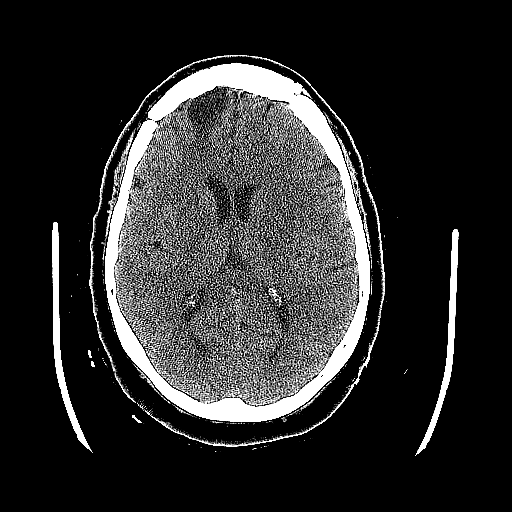
[im 34/64  brain]
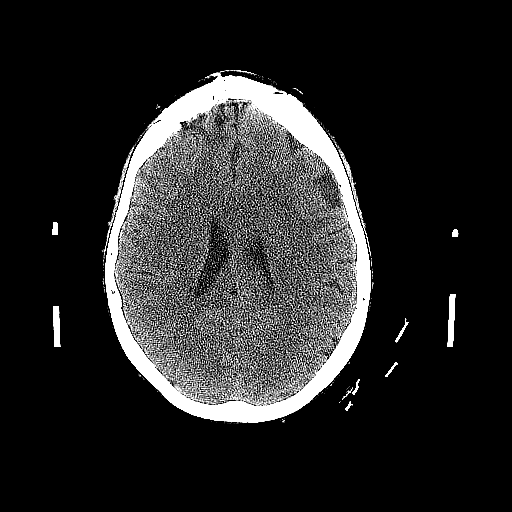
[im 34/64  bone]
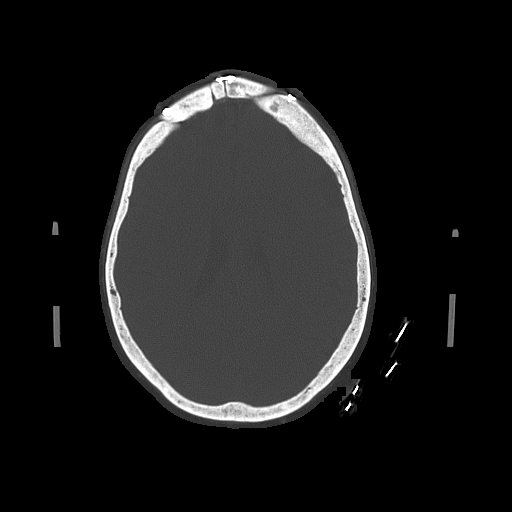
[im 37/64  brain]
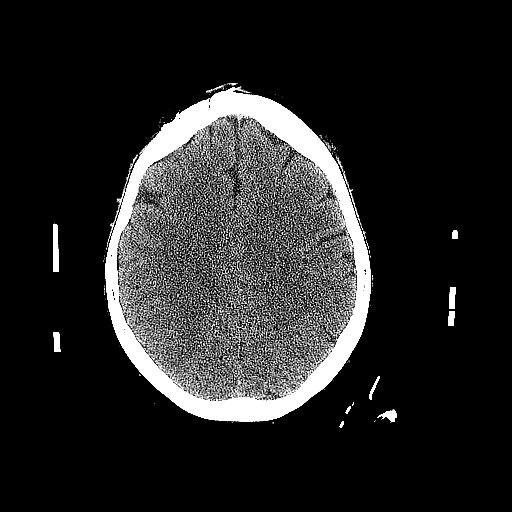
[im 40/64  brain]
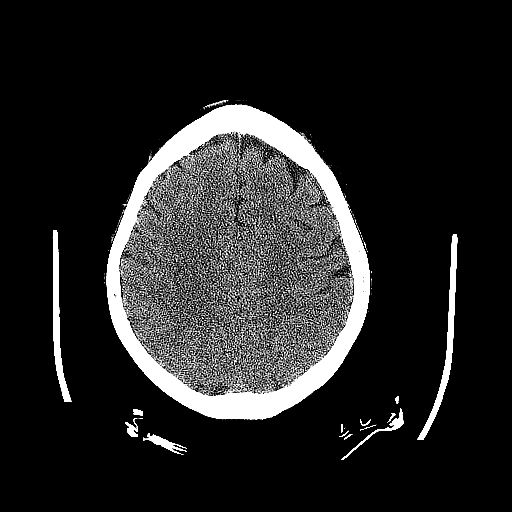
[im 44/64  brain]
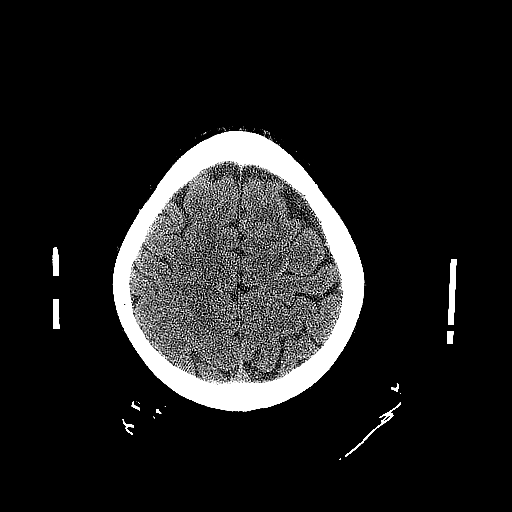
[im 50/64  brain]
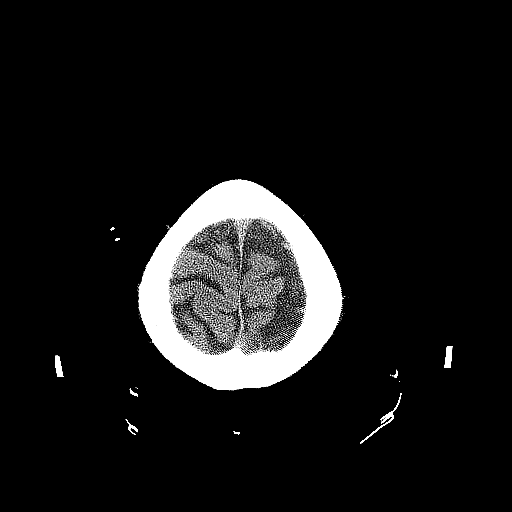
[im 50/64  bone]
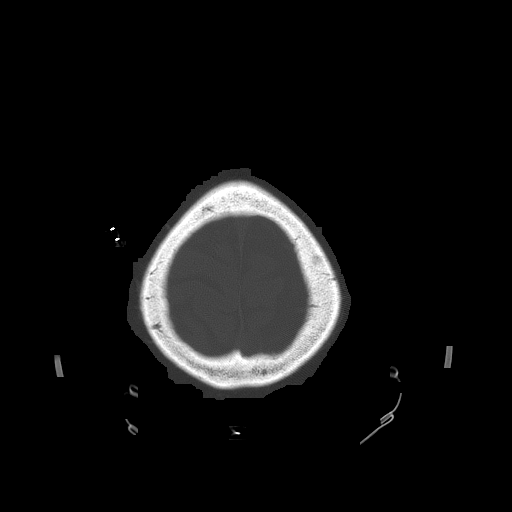
[im 54/64  brain]
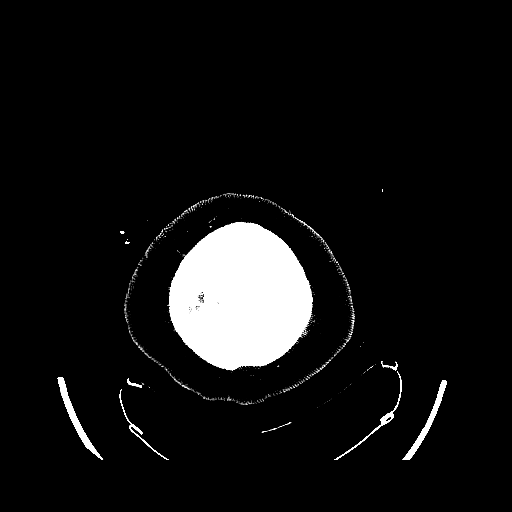
[im 57/64  brain]
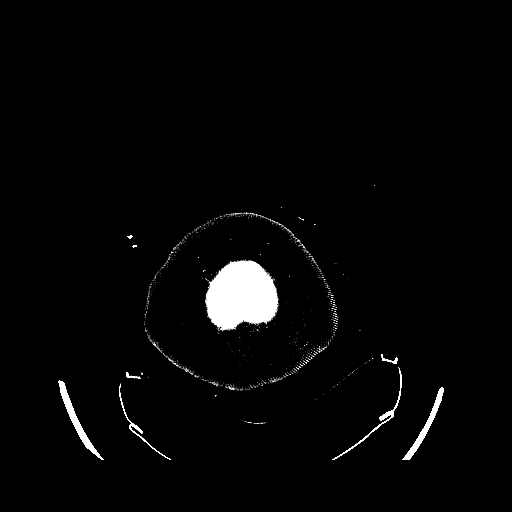
[im 60/64  brain]
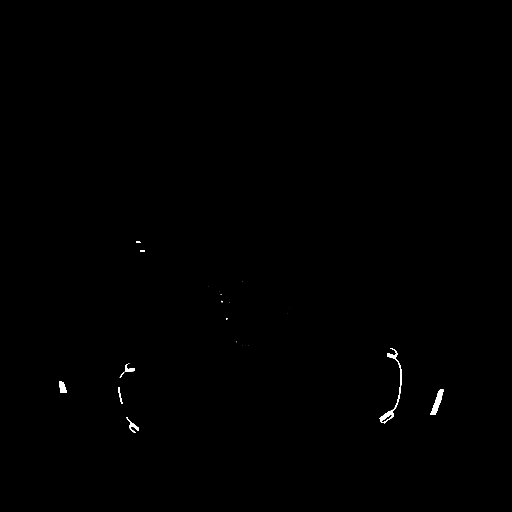

[16 of 30 positions shown; findings below may reference images not displayed]

FINDINGS: Old prior frontal craniotomy defects are again seen with
encephalomalacia involving both frontal lobes.  This is stable in
appearance.

There is no evidence of intracranial hemorrhage, brain edema or
other signs of acute infarction.  There is no evidence of
intracranial mass lesion or mass effect.  No abnormal extra-axial
fluid collections are identified. Ventricles are normal in size.
IMPRESSION: 1.  No acute intracranial abnormality.
2.  Bilateral frontal lobe encephalomalacia and old bifrontal
craniotomy defects.

## 2011-08-24 ENCOUNTER — Other Ambulatory Visit: Payer: Self-pay | Admitting: Cardiology

## 2011-09-06 ENCOUNTER — Other Ambulatory Visit: Payer: Self-pay | Admitting: Cardiology

## 2011-09-16 ENCOUNTER — Encounter: Payer: Medicare Other | Admitting: *Deleted

## 2011-09-20 ENCOUNTER — Encounter: Payer: Self-pay | Admitting: *Deleted

## 2011-09-21 ENCOUNTER — Other Ambulatory Visit: Payer: Self-pay | Admitting: Cardiology

## 2011-10-11 ENCOUNTER — Other Ambulatory Visit: Payer: Self-pay | Admitting: Cardiology

## 2011-10-22 ENCOUNTER — Other Ambulatory Visit: Payer: Self-pay | Admitting: Cardiology

## 2012-01-29 ENCOUNTER — Other Ambulatory Visit: Payer: Self-pay | Admitting: Cardiology

## 2012-02-23 ENCOUNTER — Other Ambulatory Visit: Payer: Self-pay | Admitting: Cardiology

## 2012-03-07 ENCOUNTER — Encounter: Payer: Medicare Other | Admitting: Internal Medicine

## 2012-03-31 ENCOUNTER — Other Ambulatory Visit: Payer: Self-pay | Admitting: Cardiology

## 2012-04-27 ENCOUNTER — Other Ambulatory Visit: Payer: Self-pay | Admitting: Cardiology

## 2012-05-02 ENCOUNTER — Encounter: Payer: Self-pay | Admitting: Internal Medicine

## 2012-05-02 ENCOUNTER — Ambulatory Visit (INDEPENDENT_AMBULATORY_CARE_PROVIDER_SITE_OTHER): Payer: Medicare Other | Admitting: Internal Medicine

## 2012-05-02 DIAGNOSIS — Z95810 Presence of automatic (implantable) cardiac defibrillator: Secondary | ICD-10-CM

## 2012-05-02 DIAGNOSIS — I509 Heart failure, unspecified: Secondary | ICD-10-CM

## 2012-05-02 DIAGNOSIS — I5022 Chronic systolic (congestive) heart failure: Secondary | ICD-10-CM

## 2012-05-02 DIAGNOSIS — I2589 Other forms of chronic ischemic heart disease: Secondary | ICD-10-CM

## 2012-05-02 LAB — ICD DEVICE OBSERVATION
BRDY-0002RV: 40 {beats}/min
BRDY-0005RV: 30 {beats}/min
FVT: 0
PACEART VT: 0
RV LEAD IMPEDENCE ICD: 437.5 Ohm
RV LEAD THRESHOLD: 1 V
TOT-0008: 0
TZAT-0001SLOWVT: 1
TZAT-0004SLOWVT: 8
TZON-0010SLOWVT: 40 ms
TZST-0001SLOWVT: 3
TZST-0001SLOWVT: 5
TZST-0003SLOWVT: 36 J
TZST-0003SLOWVT: 40 J
VF: 0

## 2012-05-02 NOTE — Assessment & Plan Note (Signed)
The patient's device was interrogated.  The information was reviewed. No changes were made in the programming.    

## 2012-05-02 NOTE — Patient Instructions (Signed)
Remote monitoring is used to monitor your Pacemaker of ICD from home. This monitoring reduces the number of office visits required to check your device to one time per year. It allows us to keep an eye on the functioning of your device to ensure it is working properly. You are scheduled for a device check from home on August 07, 2012. You may send your transmission at any time that day. If you have a wireless device, the transmission will be sent automatically. After your physician reviews your transmission, you will receive a postcard with your next transmission date.  Your physician wants you to follow-up in: 1 year with Dr Klein.  You will receive a reminder letter in the mail two months in advance. If you don't receive a letter, please call our office to schedule the follow-up appointment.  

## 2012-05-02 NOTE — Assessment & Plan Note (Signed)
stable. Largely nonischemic cardiomyopathy

## 2012-05-02 NOTE — Assessment & Plan Note (Signed)
No recurent arrhtyhmia

## 2012-05-02 NOTE — Progress Notes (Signed)
skf Patient has no care team.   HPI  Crystal Duran is a 66 y.o. female seen following ICD implantation in June 2011 and aborted cardiac arrest occurring in the context of a previous myocardial infarction and catheterization demonstrating mostly   residual nonobstructive disease. Ejection fraction was 20-25%. Was felt her cardiomyopathy was out of proportion to her coronary disease.   She has class II congestive failure with shortness of breaath with modest exertion  The patient denies chest pain nocturnal dyspnea, orthopnea or peripheral edema. There have been no palpitations, lightheadedness or syncope.     Past Medical History  Diagnosis Date  . Cardiac arrest - ventricular fibrillation     aborted- 2011  . 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 Jude Fortify ICD 123-40Q  . Systolic heart failure     compensated  . CAD (coronary artery disease)   . MI (myocardial infarction)     Hx of inferior wall MI   . S/P cardiac catheterization     s/p left cateterization with patent coronary arteries  . Ischemic cardiomyopathy     severe  . HTN (hypertension)   . Hypercholesteremia   . Hypothyroidism   . History of tobacco abuse   . Mild renal insufficiency     Past Surgical History  Procedure Date  . Defibrillator implantation     single chamber. [employer] Jude Fortify ICD [id number]. Remote- no.   [name] Unspecified area hysterectomy     Current Outpatient Prescriptions  Medication Sig Dispense Refill  . aspirin 81 MG tablet Take 81 mg by mouth daily.        . carvedilol (COREG) 25 MG tablet Take 25 mg by mouth 2 (two) times daily.        . furosemide (LASIX) 40 MG tablet Take 40 mg by mouth 2 (two) times daily.        . isosorbide mononitrate (IMDUR) 120 MG 24 hr tablet Take 120 mg by mouth daily.        [name] levothyroxine (SYNTHROID, LEVOTHROID) 125 MCG tablet Take 125 mcg by mouth daily.        [name] losartan (COZAAR) 50 MG tablet Take 50 mg by  mouth daily.        . pantoprazole (PROTONIX) 40 MG tablet Take 40 mg by mouth daily.        . prasugrel (EFFIENT) 10 MG TABS Take 10 mg by mouth daily.        . ramipril (ALTACE) 10 MG capsule Take 10 mg by mouth daily.       . rosuvastatin (CRESTOR) 20 MG tablet Take 20 mg by mouth daily.        [name] spironolactone (ALDACTONE) 25 MG tablet Take 25 mg by mouth daily.          No Known Allergies  Review of Systems negative except from HPI and PMH  Physical Exam BP 142/82  Pulse 82  Ht 5' 4.5" (1.638 m)  Wt 178 lb 11.2 oz (81.058 kg)  BMI 30.20 kg/m2 Well developed and nourished in no acute distress HENT normal Neck supple with JVP-flat Clear Device pocket well healed; without hematoma or erythema  Regular rate and rhythm, no murmurs or gallops Abd-soft with active BS No Clubbing cyanosis edema Skin-warm and dry A & Oriented  Grossly normal sensory and motor function  Electrocardiogram demonstrates sinus rhythm and a 1 intervals 19/10/42 Axis is -40 Previous inferior wall MI Assessment and  Plan

## 2012-05-30 VITALS — BP 142/82 | HR 82 | Ht 64.5 in | Wt 178.7 lb

## 2012-05-30 DIAGNOSIS — 419620001 Death: Secondary | SNOMED CT

## 2012-05-30 DEATH — deceased

## 2012-08-07 ENCOUNTER — Encounter: Payer: Medicare Other | Admitting: *Deleted

## 2012-08-08 ENCOUNTER — Encounter: Payer: Self-pay | Admitting: *Deleted

## 2012-09-29 ENCOUNTER — Encounter: Payer: Self-pay | Admitting: *Deleted

## 2012-11-22 ENCOUNTER — Ambulatory Visit (INDEPENDENT_AMBULATORY_CARE_PROVIDER_SITE_OTHER): Payer: Medicare Other | Admitting: *Deleted

## 2012-11-22 ENCOUNTER — Encounter: Payer: Self-pay | Admitting: Internal Medicine

## 2012-11-22 DIAGNOSIS — I5022 Chronic systolic (congestive) heart failure: Secondary | ICD-10-CM

## 2012-11-22 DIAGNOSIS — I509 Heart failure, unspecified: Secondary | ICD-10-CM

## 2012-11-22 DIAGNOSIS — I2589 Other forms of chronic ischemic heart disease: Secondary | ICD-10-CM

## 2012-11-22 NOTE — Progress Notes (Signed)
ICD check with [employer] 

## 2012-11-28 DIAGNOSIS — 419620001 Death: Secondary | SNOMED CT

## 2012-11-28 DEATH — deceased

## 2013-02-26 ENCOUNTER — Ambulatory Visit (INDEPENDENT_AMBULATORY_CARE_PROVIDER_SITE_OTHER): Payer: Medicare Other | Admitting: *Deleted

## 2013-02-26 DIAGNOSIS — I5022 Chronic systolic (congestive) heart failure: Secondary | ICD-10-CM

## 2013-02-26 DIAGNOSIS — I509 Heart failure, unspecified: Secondary | ICD-10-CM

## 2013-02-26 LAB — ICD DEVICE OBSERVATION
BRDY-0002RV: 40 {beats}/min
DEV-0020ICD: NEGATIVE
HV IMPEDENCE: 74 Ohm
RV LEAD AMPLITUDE: 12 mv
TOT-0007: 2
TOT-0009: 1
TOT-0010: 13
TZAT-0012SLOWVT: 200 ms
TZAT-0013SLOWVT: 1
TZAT-0018SLOWVT: NEGATIVE
TZAT-0019SLOWVT: 7.5 V
TZAT-0020SLOWVT: 1 ms
TZON-0004SLOWVT: 40
TZON-0005SLOWVT: 6
TZST-0001SLOWVT: 2
TZST-0001SLOWVT: 4
TZST-0003SLOWVT: 40 J
TZST-0003SLOWVT: 40 J
VF: 0

## 2013-02-26 NOTE — Progress Notes (Signed)
ICD check in office. 

## 2013-02-27 DIAGNOSIS — 419620001 Death: Secondary | SNOMED CT

## 2013-02-27 DEATH — deceased

## 2013-03-14 ENCOUNTER — Encounter: Payer: Self-pay | Admitting: Internal Medicine

## 2014-09-21 DIAGNOSIS — G4733 Obstructive sleep apnea (adult) (pediatric): Secondary | ICD-10-CM | POA: Diagnosis not present

## 2014-09-21 DIAGNOSIS — R0602 Shortness of breath: Secondary | ICD-10-CM | POA: Diagnosis not present

## 2014-10-22 DIAGNOSIS — G4733 Obstructive sleep apnea (adult) (pediatric): Secondary | ICD-10-CM | POA: Diagnosis not present

## 2014-10-22 DIAGNOSIS — R0602 Shortness of breath: Secondary | ICD-10-CM | POA: Diagnosis not present

## 2014-12-21 DIAGNOSIS — G4733 Obstructive sleep apnea (adult) (pediatric): Secondary | ICD-10-CM | POA: Diagnosis not present

## 2014-12-21 DIAGNOSIS — R0602 Shortness of breath: Secondary | ICD-10-CM | POA: Diagnosis not present

## 2015-01-20 DIAGNOSIS — G4733 Obstructive sleep apnea (adult) (pediatric): Secondary | ICD-10-CM | POA: Diagnosis not present

## 2015-01-20 DIAGNOSIS — R0602 Shortness of breath: Secondary | ICD-10-CM | POA: Diagnosis not present

## 2015-02-14 ENCOUNTER — Other Ambulatory Visit: Payer: Self-pay | Admitting: Cardiology

## 2015-02-14 DIAGNOSIS — I10 Essential (primary) hypertension: Secondary | ICD-10-CM | POA: Diagnosis not present

## 2015-02-14 DIAGNOSIS — R0609 Other forms of dyspnea: Secondary | ICD-10-CM | POA: Diagnosis not present

## 2015-02-14 DIAGNOSIS — E785 Hyperlipidemia, unspecified: Secondary | ICD-10-CM | POA: Diagnosis not present

## 2015-02-14 DIAGNOSIS — I2510 Atherosclerotic heart disease of native coronary artery without angina pectoris: Secondary | ICD-10-CM | POA: Diagnosis not present

## 2015-02-14 DIAGNOSIS — R079 Chest pain, unspecified: Secondary | ICD-10-CM

## 2015-02-14 DIAGNOSIS — I252 Old myocardial infarction: Secondary | ICD-10-CM | POA: Diagnosis not present

## 2015-02-20 DIAGNOSIS — R0602 Shortness of breath: Secondary | ICD-10-CM | POA: Diagnosis not present

## 2015-02-20 DIAGNOSIS — G4733 Obstructive sleep apnea (adult) (pediatric): Secondary | ICD-10-CM | POA: Diagnosis not present

## 2015-02-21 ENCOUNTER — Encounter (HOSPITAL_COMMUNITY)
Admission: RE | Admit: 2015-02-21 | Discharge: 2015-02-21 | Disposition: A | Discharge status: Home / Self Care | Payer: Medicare Other | Source: Ambulatory Visit | Attending: Cardiology | Admitting: Cardiology

## 2015-02-21 DIAGNOSIS — R079 Chest pain, unspecified: Secondary | ICD-10-CM | POA: Diagnosis not present

## 2015-02-21 DIAGNOSIS — R0609 Other forms of dyspnea: Secondary | ICD-10-CM | POA: Diagnosis not present

## 2015-02-21 DIAGNOSIS — I10 Essential (primary) hypertension: Secondary | ICD-10-CM | POA: Diagnosis not present

## 2015-02-21 DIAGNOSIS — I252 Old myocardial infarction: Secondary | ICD-10-CM | POA: Diagnosis not present

## 2015-02-21 DIAGNOSIS — I2510 Atherosclerotic heart disease of native coronary artery without angina pectoris: Secondary | ICD-10-CM | POA: Diagnosis not present

## 2015-02-21 MED ORDER — ACETAMINOPHEN 325 MG PO TABS
ORAL_TABLET | ORAL | Status: AC
  Filled 2015-02-21: qty 2

## 2015-02-21 MED ORDER — REGADENOSON 0.4 MG/5ML IV SOLN
INTRAVENOUS | Status: AC
  Filled 2015-02-21: qty 5

## 2015-02-21 MED ORDER — TECHNETIUM TC 99M SESTAMIBI GENERIC - CARDIOLITE
30.0000 | Freq: Once | INTRAVENOUS | Status: AC | PRN
  Administered 2015-02-21: 30 via INTRAVENOUS

## 2015-02-21 MED ORDER — TECHNETIUM TC 99M SESTAMIBI GENERIC - CARDIOLITE
10.0000 | Freq: Once | INTRAVENOUS | Status: AC | PRN
  Administered 2015-02-21: 10 via INTRAVENOUS

## 2015-02-21 MED ORDER — REGADENOSON 0.4 MG/5ML IV SOLN
0.4000 mg | Freq: Once | INTRAVENOUS | Status: AC
  Administered 2015-02-21: 0.4 mg via INTRAVENOUS

## 2015-03-22 DIAGNOSIS — G4733 Obstructive sleep apnea (adult) (pediatric): Secondary | ICD-10-CM | POA: Diagnosis not present

## 2015-03-22 DIAGNOSIS — R0602 Shortness of breath: Secondary | ICD-10-CM | POA: Diagnosis not present

## 2015-04-22 DIAGNOSIS — R0602 Shortness of breath: Secondary | ICD-10-CM | POA: Diagnosis not present

## 2015-04-22 DIAGNOSIS — G4733 Obstructive sleep apnea (adult) (pediatric): Secondary | ICD-10-CM | POA: Diagnosis not present

## 2015-07-18 ENCOUNTER — Telehealth: Payer: Self-pay | Admitting: Cardiology

## 2015-07-18 NOTE — Telephone Encounter (Signed)
LMOVM for pt to return call in regards to her home monitor.  

## 2016-03-26 ENCOUNTER — Telehealth: Payer: Self-pay | Admitting: Cardiology

## 2016-03-26 NOTE — Telephone Encounter (Signed)
Attempted to call pt b/c her home monitor has not updated in at least 8 days. No answer on one number and invalid number for the second number.

## 2016-04-05 ENCOUNTER — Other Ambulatory Visit: Payer: Self-pay | Admitting: Cardiology

## 2016-04-05 DIAGNOSIS — R079 Chest pain, unspecified: Secondary | ICD-10-CM

## 2016-04-14 ENCOUNTER — Encounter (HOSPITAL_COMMUNITY): Payer: Medicare HMO

## 2016-05-10 ENCOUNTER — Encounter (HOSPITAL_COMMUNITY): Payer: Medicare HMO | Attending: Cardiology

## 2016-05-10 ENCOUNTER — Encounter (HOSPITAL_COMMUNITY): Admission: RE | Admit: 2016-05-10 | Payer: Medicare HMO | Source: Ambulatory Visit

## 2016-05-10 ENCOUNTER — Telehealth: Payer: Self-pay | Admitting: Cardiology

## 2016-05-10 NOTE — Telephone Encounter (Signed)
Spoke w/ pt and requested that she send a manual transmission b/c her home monitor has not updated in at least 14 days. Pt informed me that her monitor has not worked in 2 years. She stated that when she called tech services 2 years ago they was unable to help her. Informed her that I would talk a rep that works for the company and see what they recommended. Pt verbalized understanding.

## 2016-05-28 ENCOUNTER — Ambulatory Visit: Payer: Medicare HMO | Admitting: Internal Medicine

## 2016-05-31 ENCOUNTER — Encounter: Payer: Self-pay | Admitting: Internal Medicine

## 2016-06-18 ENCOUNTER — Encounter (HOSPITAL_COMMUNITY): Payer: Medicare HMO

## 2016-06-18 ENCOUNTER — Encounter (HOSPITAL_COMMUNITY): Payer: Medicare HMO | Attending: Cardiology

## 2016-06-25 ENCOUNTER — Inpatient Hospital Stay (HOSPITAL_COMMUNITY)
Admission: EM | Admit: 2016-06-25 | Discharge: 2016-06-29 | DRG: 291 | Disposition: A | Discharge status: Home / Self Care | Payer: Medicare HMO | Attending: Cardiology | Admitting: Cardiology

## 2016-06-25 ENCOUNTER — Encounter (HOSPITAL_COMMUNITY): Payer: Self-pay | Admitting: *Deleted

## 2016-06-25 ENCOUNTER — Emergency Department (HOSPITAL_COMMUNITY): Payer: Medicare HMO

## 2016-06-25 DIAGNOSIS — R079 Chest pain, unspecified: Secondary | ICD-10-CM

## 2016-06-25 DIAGNOSIS — Z23 Encounter for immunization: Secondary | ICD-10-CM

## 2016-06-25 DIAGNOSIS — I5021 Acute systolic (congestive) heart failure: Secondary | ICD-10-CM | POA: Diagnosis present

## 2016-06-25 DIAGNOSIS — Z95810 Presence of automatic (implantable) cardiac defibrillator: Secondary | ICD-10-CM

## 2016-06-25 DIAGNOSIS — E785 Hyperlipidemia, unspecified: Secondary | ICD-10-CM | POA: Diagnosis present

## 2016-06-25 DIAGNOSIS — I130 Hypertensive heart and chronic kidney disease with heart failure and stage 1 through stage 4 chronic kidney disease, or unspecified chronic kidney disease: Secondary | ICD-10-CM | POA: Diagnosis present

## 2016-06-25 DIAGNOSIS — Z9861 Coronary angioplasty status: Secondary | ICD-10-CM | POA: Diagnosis not present

## 2016-06-25 DIAGNOSIS — R55 Syncope and collapse: Secondary | ICD-10-CM | POA: Diagnosis present

## 2016-06-25 DIAGNOSIS — N182 Chronic kidney disease, stage 2 (mild): Secondary | ICD-10-CM | POA: Diagnosis present

## 2016-06-25 DIAGNOSIS — Z87891 Personal history of nicotine dependence: Secondary | ICD-10-CM

## 2016-06-25 DIAGNOSIS — I509 Heart failure, unspecified: Secondary | ICD-10-CM

## 2016-06-25 DIAGNOSIS — E1122 Type 2 diabetes mellitus with diabetic chronic kidney disease: Secondary | ICD-10-CM | POA: Diagnosis present

## 2016-06-25 DIAGNOSIS — Z8249 Family history of ischemic heart disease and other diseases of the circulatory system: Secondary | ICD-10-CM | POA: Diagnosis not present

## 2016-06-25 DIAGNOSIS — Z8674 Personal history of sudden cardiac arrest: Secondary | ICD-10-CM | POA: Diagnosis not present

## 2016-06-25 DIAGNOSIS — I25118 Atherosclerotic heart disease of native coronary artery with other forms of angina pectoris: Secondary | ICD-10-CM | POA: Diagnosis present

## 2016-06-25 DIAGNOSIS — R0602 Shortness of breath: Secondary | ICD-10-CM | POA: Diagnosis present

## 2016-06-25 DIAGNOSIS — E039 Hypothyroidism, unspecified: Secondary | ICD-10-CM | POA: Diagnosis present

## 2016-06-25 DIAGNOSIS — I252 Old myocardial infarction: Secondary | ICD-10-CM | POA: Diagnosis not present

## 2016-06-25 HISTORY — DX: Heart failure, unspecified: I50.9

## 2016-06-25 LAB — CBC
HEMATOCRIT: 43.7 % (ref 36.0–46.0)
Hemoglobin: 13.9 g/dL (ref 12.0–15.0)
MCH: 29.4 pg (ref 26.0–34.0)
MCHC: 31.8 g/dL (ref 30.0–36.0)
MCV: 92.6 fL (ref 78.0–100.0)
PLATELETS: 195 10*3/uL (ref 150–400)
RBC: 4.72 MIL/uL (ref 3.87–5.11)
RDW: 16.5 % — AB (ref 11.5–15.5)
WBC: 5.7 10*3/uL (ref 4.0–10.5)

## 2016-06-25 LAB — I-STAT TROPONIN, ED: Troponin i, poc: 0.03 ng/mL (ref 0.00–0.08)

## 2016-06-25 LAB — BASIC METABOLIC PANEL
Anion gap: 12 (ref 5–15)
BUN: 13 mg/dL (ref 6–20)
CO2: 24 mmol/L (ref 22–32)
Calcium: 8.9 mg/dL (ref 8.9–10.3)
Chloride: 108 mmol/L (ref 101–111)
Creatinine, Ser: 1.35 mg/dL — ABNORMAL HIGH (ref 0.44–1.00)
GFR calc Af Amer: 45 mL/min — ABNORMAL LOW (ref >60)
GFR calc non Af Amer: 38 mL/min — ABNORMAL LOW (ref >60)
Glucose, Bld: 125 mg/dL — ABNORMAL HIGH (ref 65–99)
Potassium: 3.2 mmol/L — ABNORMAL LOW (ref 3.5–5.1)
Sodium: 144 mmol/L (ref 135–145)

## 2016-06-25 LAB — GLUCOSE, CAPILLARY
Glucose-Capillary: 154 mg/dL — ABNORMAL HIGH (ref 65–99)
Glucose-Capillary: 130 mg/dL — ABNORMAL HIGH (ref 65–99)

## 2016-06-25 LAB — BRAIN NATRIURETIC PEPTIDE
B Natriuretic Peptide: 2526.8 pg/mL — ABNORMAL HIGH (ref 0.0–100.0)
B Natriuretic Peptide: 1871.8 pg/mL — ABNORMAL HIGH (ref 0.0–100.0)

## 2016-06-25 LAB — TROPONIN I: Troponin I: 0.03 ng/mL (ref <0.03)

## 2016-06-25 MED ORDER — ASPIRIN 81 MG PO TABS: 81.0000 mg | ORAL_TABLET | Freq: Every day | ORAL | Status: DC

## 2016-06-25 MED ORDER — ASPIRIN EC 81 MG PO TBEC
81.0000 mg | DELAYED_RELEASE_TABLET | Freq: Every day | ORAL | Status: DC
  Administered 2016-06-26 – 2016-06-29 (×4): 81 mg via ORAL
  Filled 2016-06-25 (×4): qty 1

## 2016-06-25 MED ORDER — ROSUVASTATIN CALCIUM 20 MG PO TABS
20.0000 mg | ORAL_TABLET | Freq: Every day | ORAL | Status: DC
  Administered 2016-06-26 – 2016-06-28 (×3): 20 mg via ORAL
  Filled 2016-06-25 (×4): qty 1

## 2016-06-25 MED ORDER — PANTOPRAZOLE SODIUM 40 MG PO TBEC: 40.0000 mg | DELAYED_RELEASE_TABLET | Freq: Every day | ORAL | Status: DC

## 2016-06-25 MED ORDER — ASPIRIN 81 MG PO CHEW
324.0000 mg | CHEWABLE_TABLET | ORAL | Status: AC
  Administered 2016-06-25: 324 mg via ORAL
  Filled 2016-06-25: qty 4

## 2016-06-25 MED ORDER — HEPARIN BOLUS VIA INFUSION
3000.0000 [IU] | Freq: Once | INTRAVENOUS | Status: AC
  Administered 2016-06-25: 3000 [IU] via INTRAVENOUS
  Filled 2016-06-25: qty 3000

## 2016-06-25 MED ORDER — ASPIRIN 300 MG RE SUPP
300.0000 mg | RECTAL | Status: AC
  Filled 2016-06-25: qty 1

## 2016-06-25 MED ORDER — LOSARTAN POTASSIUM 50 MG PO TABS
50.0000 mg | ORAL_TABLET | Freq: Every day | ORAL | Status: DC
  Administered 2016-06-25 – 2016-06-29 (×5): 50 mg via ORAL
  Filled 2016-06-25 (×5): qty 1

## 2016-06-25 MED ORDER — INSULIN ASPART 100 UNIT/ML ~~LOC~~ SOLN
0.0000 [IU] | Freq: Three times a day (TID) | SUBCUTANEOUS | Status: DC
  Administered 2016-06-26 – 2016-06-27 (×3): 1 [IU] via SUBCUTANEOUS

## 2016-06-25 MED ORDER — POTASSIUM CHLORIDE CRYS ER 20 MEQ PO TBCR
40.0000 meq | EXTENDED_RELEASE_TABLET | Freq: Once | ORAL | Status: AC
  Administered 2016-06-25: 40 meq via ORAL
  Filled 2016-06-25: qty 2

## 2016-06-25 MED ORDER — CARVEDILOL 6.25 MG PO TABS
6.2500 mg | ORAL_TABLET | Freq: Two times a day (BID) | ORAL | Status: DC
  Administered 2016-06-26 – 2016-06-29 (×7): 6.25 mg via ORAL
  Filled 2016-06-25 (×7): qty 1

## 2016-06-25 MED ORDER — HEPARIN (PORCINE) IN NACL 100-0.45 UNIT/ML-% IJ SOLN
1150.0000 [IU]/h | INTRAMUSCULAR | Status: DC
  Administered 2016-06-25: 850 [IU]/h via INTRAVENOUS
  Administered 2016-06-26: 950 [IU]/h via INTRAVENOUS
  Filled 2016-06-25 (×3): qty 250

## 2016-06-25 MED ORDER — LEVOTHYROXINE SODIUM 25 MCG PO TABS: 125.0000 ug | ORAL_TABLET | Freq: Every day | ORAL | Status: DC

## 2016-06-25 MED ORDER — POTASSIUM CHLORIDE CRYS ER 20 MEQ PO TBCR
20.0000 meq | EXTENDED_RELEASE_TABLET | Freq: Two times a day (BID) | ORAL | Status: DC
  Administered 2016-06-26 – 2016-06-29 (×8): 20 meq via ORAL
  Filled 2016-06-25 (×8): qty 1

## 2016-06-25 MED ORDER — NITROGLYCERIN 0.4 MG SL SUBL: 0.4000 mg | SUBLINGUAL_TABLET | SUBLINGUAL | Status: DC | PRN

## 2016-06-25 MED ORDER — NITROGLYCERIN 2 % TD OINT
1.0000 [in_us] | TOPICAL_OINTMENT | Freq: Four times a day (QID) | TRANSDERMAL | Status: DC
  Administered 2016-06-25 – 2016-06-28 (×11): 1 [in_us] via TOPICAL
  Filled 2016-06-25: qty 30

## 2016-06-25 MED ORDER — ACETAMINOPHEN 325 MG PO TABS
650.0000 mg | ORAL_TABLET | ORAL | Status: DC | PRN
  Administered 2016-06-26 (×2): 650 mg via ORAL
  Filled 2016-06-25 (×2): qty 2

## 2016-06-25 MED ORDER — LEVOTHYROXINE SODIUM 75 MCG PO TABS
150.0000 ug | ORAL_TABLET | Freq: Every day | ORAL | Status: DC
  Administered 2016-06-26 – 2016-06-29 (×4): 150 ug via ORAL
  Filled 2016-06-25 (×4): qty 2

## 2016-06-25 MED ORDER — FUROSEMIDE 10 MG/ML IJ SOLN
40.0000 mg | Freq: Once | INTRAMUSCULAR | Status: AC
  Administered 2016-06-25: 40 mg via INTRAVENOUS
  Filled 2016-06-25: qty 4

## 2016-06-25 MED ORDER — SODIUM CHLORIDE 0.9 % IV SOLN
INTRAVENOUS | Status: DC
  Administered 2016-06-25: 19:00:00 via INTRAVENOUS

## 2016-06-25 MED ORDER — ONDANSETRON HCL 4 MG/2ML IJ SOLN: 4.0000 mg | Freq: Four times a day (QID) | INTRAMUSCULAR | Status: DC | PRN

## 2016-06-25 NOTE — Progress Notes (Signed)
Critical lab value called to nurse from lab, troponin 0.03. Nurse read back value and confirmed with the lab tech.  Nurse called attending after service and left message with after hours number.

## 2016-06-25 NOTE — ED Provider Notes (Signed)
[facility]-EMERGENCY DEPT Provider Note   CSN: [id number] Arrival date & time: 06/25/16  1339     History   Chief Complaint Chief Complaint  Patient presents with  . Shortness of Breath  . Near Syncope    HPI Crystal Duran is a 70 y.o. female.  Patient is 70 yo F with PMH of CHF and significant cardiac history including MI, cardiac arrest, and defibrillator placement, presenting to ED with worsening shortness of breath over the past 2 weeks. Symptoms initially started with postnasal drip and cough, progressing to significant dyspnea on exertion with worsening cough productive of white sputum. Patient states she had syncopal event yesterday while talking to a friend due to SOB, as well as difficulty ambulating to bathroom or any physical activity including getting dressed. Also reports chest and abdominal "tightness," but no chest pain, palpitations, or swelling of legs. Denies fever or chills. Patient followed closely by cardiologist, Dr. Sharyn Lull. States she takes 40 mg Lasix BID.      Past Medical History:  Diagnosis Date  . CAD (coronary artery disease)   . Cardiac arrest - ventricular fibrillation    aborted- 2011  . CHF (congestive heart failure) (HCC)   . History of tobacco abuse   . HTN (hypertension)   . Hypercholesteremia   . Hypothyroidism   . 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 Jude Fortify ICD 123-40Q  . Ischemic cardiomyopathy    severe  . MI (myocardial infarction)    Hx of inferior wall MI   . Mild renal insufficiency   . S/P cardiac catheterization    s/p left cateterization with patent coronary arteries  . Systolic heart failure    compensated    Patient Active Problem List   Diagnosis Date Noted  . aborted cardiac arrest 03/01/2011  . Chronic systolic congestive heart failure (HCC) 03/25/2011  . HYPERTENSION, BENIGN 06/01/2010  . CAD 06/01/2010  . CARDIOMYOPATHY, ISCHEMIC 06/01/2010  .  IMPLANTATION OF DEFIBRILLATOR,ST. JUDE FORTIFY ICD 123-40Q 06/01/2010    Past Surgical History:  Procedure Laterality Date  . defibrillator implantation     single chamber. [employer] Jude Fortify ICD 782-95A. Remote- no.   . unspecified area hysterectomy      OB History    No data available       Home Medications    Prior to Admission medications   Medication Sig Start Date End Date Taking? Authorizing Provider  aspirin 81 MG tablet Take 81 mg by mouth daily.      Historical Provider, MD  carvedilol (COREG) 25 MG tablet Take 25 mg by mouth 2 (two) times daily.      Historical Provider, MD  furosemide (LASIX) 40 MG tablet Take 40 mg by mouth 2 (two) times daily.      Historical Provider, MD  isosorbide mononitrate (IMDUR) 120 MG 24 hr tablet Take 120 mg by mouth daily.      Historical Provider, MD  levothyroxine (SYNTHROID, LEVOTHROID) 125 MCG tablet Take 125 mcg by mouth daily.      Historical Provider, MD  losartan (COZAAR) 50 MG tablet Take 50 mg by mouth daily.      Historical Provider, MD  pantoprazole (PROTONIX) 40 MG tablet Take 40 mg by mouth daily.      Historical Provider, MD  prasugrel (EFFIENT) 10 MG TABS Take 10 mg by mouth daily.      Historical Provider, MD  ramipril (ALTACE) 10 MG capsule Take 10 mg by mouth daily.  Historical Provider, MD  rosuvastatin (CRESTOR) 20 MG tablet Take 20 mg by mouth daily.      Historical Provider, MD  spironolactone (ALDACTONE) 25 MG tablet Take 25 mg by mouth daily.      Historical Provider, MD    Family History Family History  Problem Relation Age of Onset  . Coronary artery disease Other     postive family Hx    Social History Social History  Substance Use Topics  . Smoking status: Former Games developer  . Smokeless tobacco: Never Used     Comment: quit 10 years ago  . Alcohol use Yes     Comment: occ     Allergies   Review of patient's allergies indicates no known allergies.   Review of Systems Review of Systems    Constitutional: Negative for chills and fever.  HENT: Negative for ear pain and sore throat.   Eyes: Negative for pain and visual disturbance.  Respiratory: Positive for cough, chest tightness and shortness of breath.   Cardiovascular: Negative for chest pain, palpitations and leg swelling.  Gastrointestinal: Positive for abdominal distention. Negative for abdominal pain.  Genitourinary: Negative for dysuria, flank pain and hematuria.  Musculoskeletal: Negative for back pain and neck pain.  Skin: Negative for color change and rash.  Neurological: Positive for syncope. Negative for dizziness, seizures, weakness, numbness and headaches.     Physical Exam Updated Vital Signs BP 149/76   Pulse 88   Temp 98.4 F (36.9 C) (Oral)   Resp 24   Ht 5' 4.5" (1.638 m)   Wt 70.3 kg   SpO2 98%   BMI 26.19 kg/m   Physical Exam  Constitutional: She appears well-developed and well-nourished. No distress.  Patient sitting at age of bed, no acute respiratory distress, but mildly dyspneic, exacerbated by any movement.  HENT:  Head: Normocephalic and atraumatic.  Mouth/Throat: Oropharynx is clear and moist.  Eyes: Conjunctivae are normal.  Neck: Normal range of motion. Neck supple. No JVD present.  Cardiovascular: Normal rate, regular rhythm, normal heart sounds and intact distal pulses.   Pulmonary/Chest: No respiratory distress.  Speaking in full sentences, SpO2 98% on RA. Crackles noted to mid to lower lobes bilaterally. Symmetric thoracic expansion, no accessory muscle use. No TTP of ribs, thoracic spine, or chest wall.  Abdominal: Soft. Bowel sounds are normal. There is no tenderness.  Mild abdominal distension, but no ascites or fluid wave noted.  Musculoskeletal: Normal range of motion. She exhibits no edema or tenderness.  Neurological: She is alert.  Skin: Skin is warm and dry.  Psychiatric: She has a normal mood and affect.  Nursing note and vitals reviewed.    ED Treatments /  Results  Labs (all labs ordered are listed, but only abnormal results are displayed) Labs Reviewed  BASIC METABOLIC PANEL - Abnormal; Notable for the following:       Result Value   Potassium 3.2 (*)    Glucose, Bld 125 (*)    Creatinine, Ser 1.35 (*)    GFR calc non Af Amer 38 (*)    GFR calc Af Amer 45 (*)    All other components within normal limits  CBC - Abnormal; Notable for the following:    RDW 16.5 (*)    All other components within normal limits  BRAIN NATRIURETIC PEPTIDE  BRAIN NATRIURETIC PEPTIDE  Rosezena Sensor, ED    EKG  EKG Interpretation  Date/Time:  Friday June 25 2016 13:49:26 EDT Ventricular Rate:  95 PR Interval:  196 QRS Duration: 138 QT Interval:  436 QTC Calculation: 547 R Axis:   -44 Text Interpretation:  Normal sinus rhythm Left axis deviation new  Left ventricular hypertrophy with QRS widening and repolarization abnormality Confirmed by Anitra LauthPLUNKETT  MD, Alphonzo LemmingsWHITNEY ([id number]) on 06/25/2016 4:23:49 PM       Radiology Dg Chest 2 View  Result Date: 06/25/2016 CLINICAL DATA:  Cough and short of breath EXAM: CHEST  2 VIEW COMPARISON:  02/19/2010 FINDINGS: Cardiac enlargement has progressed in the interval. Single lead pacemaker and defibrillator unchanged Pulmonary vascular congestion with mild interstitial edema. Small bilateral pleural effusions. IMPRESSION: Congestive heart failure with mild interstitial edema and small pleural effusions. Electronically Signed   By: Marlan Palauharles  Clark M.D.   On: 06/25/2016 15:24    Procedures Procedures (including critical care time)  Medications Ordered in ED Medications  furosemide (LASIX) injection 40 mg (40 mg Intravenous Given 06/25/16 1635)     Initial Impression / Assessment and Plan / ED Course  I have reviewed the triage vital signs and the nursing notes.  Pertinent labs & imaging results that were available during my care of the patient were reviewed by me and considered in my medical decision making  (see chart for details).  Clinical Course   Patient is 70 yo F with PMH of CHF, presenting with worsening shortness of breath over the past 2 weeks. She is dyspneic, her shortness of breath is exacerbated by any movement, and she has crackles in her mid to lower lung fields bilaterally. CXR shows mild interstitial edema and small pleural effusions, but no evidence of pneumonia. Patient denies chest pain, negative troponin, and no ST elevations or evidence of ischemia on EKG concerning for ACS. Given symptoms and significant cardiac history, admission consult placed to her cardiologist, Dr. Sharyn LullHarwani, for acute CHF exacerbation. Patient evaluated by attending physician, Dr. Gwyneth SproutWhitney Plunkett, who agreed with assessment and plan. 40 mg IV Lasix started in ED. Dr. Sharyn LullHarwani agreed to admission, and graciously evaluated patient in ED, assuming further care. Patient notified of plans for admission.  Final Clinical Impressions(s) / ED Diagnoses   Final diagnoses:  Acute on chronic congestive heart failure, unspecified congestive heart failure type [facility](HCC)    New Prescriptions New Prescriptions   No medications on file     Jari PiggDaryl F de Villier II, [place] 06/25/16 1737    Gwyneth SproutWhitney Plunkett, MD 06/28/16 [phone]

## 2016-06-25 NOTE — Progress Notes (Signed)
Nurse received call from Dr. Sharyn LullHarwani, physician notified of critical lab value, patient is stable and has no complaints at this time.  No orders given at this time, will continue to monitor patient.

## 2016-06-25 NOTE — Progress Notes (Signed)
ANTICOAGULATION CONSULT NOTE - Initial Consult  Pharmacy Consult:  Heparin Indication: chest pain/ACS  No Known Allergies  Patient Measurements: Height: 5\' 4"  (162.6 cm) Weight: 154 lb 1.6 oz (69.9 kg) IBW/kg (Calculated) : 54.7 Heparin Dosing Weight: 70 kg  Vital Signs: Temp: 98.2 F (36.8 C) (10/27 1825) Temp Source: Oral (10/27 1825) BP: 170/88 (10/27 1825) Pulse Rate: 88 (10/27 1825)  Labs:  Recent Labs  06/25/16 1354  HGB 13.9  HCT 43.7  PLT 195  CREATININE 1.35*    Estimated Creatinine Clearance: 36.7 mL/min (by C-G formula based on SCr of 1.35 mg/dL (H)).   Medical History: Past Medical History:  Diagnosis Date  . CAD (coronary artery disease)   . Cardiac arrest - ventricular fibrillation    aborted- 2011  . CHF (congestive heart failure) (HCC)   . History of tobacco abuse   . HTN (hypertension)   . Hypercholesteremia   . Hypothyroidism   . 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 Jude Fortify ICD 123-40Q  . Ischemic cardiomyopathy    severe  . MI (myocardial infarction)    Hx of inferior wall MI   . Mild renal insufficiency   . S/P cardiac catheterization    s/p left cateterization with patent coronary arteries  . Systolic heart failure    compensated     Assessment: [age] YOF with a significant cardiac history presented with worsening SOB.  Pharmacy consulted to initiate IV heparin for ACS.  Baseline labs reviewed.   Goal of Therapy:  Heparin level 0.3-0.7 units/ml Monitor platelets by anticoagulation protocol: Yes    Plan:  - Heparin 3000 units IV bolus x 1, then - Heparin gtt at 850 units/hr - Check 8 hr heparin level - Daily heparin level and CBC   Istvan Behar D. Laney Potashang, PharmD, BCPS Pager:  [phone] 06/25/2016, 6:45 PM

## 2016-06-25 NOTE — H&P (Signed)
Crystal Duran is an 70 y.o. female.   Chief Complaint:  HPI: Patient is 70 year old female with past medical history significant for coronary artery disease, history of injury wall MI status post PCI to RCA, ischemic/nonischemic dilated cardiomyopathy status post ICD History of cardiac arrest in the past, hypertension, hyperlipidemia, hypothyroidism, chronic kidney disease stage II, history of congestive heart failure secondary to depressed LV systolic function, came to the ER complaining of progressive worsening shortness of breath over the last 2 weeks. Patient also gives history of PND orthopnea but denies leg swelling. Also complains of vague retrosternal chest pain occasionally radiating to left arm took sublingual nitroglycerin with relief yesterday. Presently denies any chest pain. Patient was seen in my office recently because of vague chest pain and was scheduled for nuclear stress test which she missed. Patient denies any palpitations or ICD discharges but states had syncopal episode yesterday while talking to her friend. States she is recovering from a cold . Denies any fever or chills.  Past Medical History:  Diagnosis Date  . CAD (coronary artery disease)   . Cardiac arrest - ventricular fibrillation    aborted- 2011  . CHF (congestive heart failure) ([place])   . History of tobacco abuse   . HTN (hypertension)   . Hypercholesteremia   . Hypothyroidism   . 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 Jude Fortify ICD 123-40Q  . Ischemic cardiomyopathy    severe  . MI (myocardial infarction)    Hx of inferior wall MI   . Mild renal insufficiency   . S/P cardiac catheterization    s/p left cateterization with patent coronary arteries  . Systolic heart failure    compensated    Past Surgical History:  Procedure Laterality Date  . defibrillator implantation     single chamber. [place] ICD 956-21H. Remote- no.   . unspecified  area hysterectomy      Family History  Problem Relation Age of Onset  . Coronary artery disease Other     postive family Hx   Social History:  reports that she has quit smoking. She has never used smokeless tobacco. She reports that she drinks alcohol. She reports that she does not use drugs.  Allergies: No Known Allergies   (Not in a hospital admission)  Results for orders placed or performed during the hospital encounter of 06/25/16 (from the past 48 hour(s))  Basic metabolic panel     Status: Abnormal   Collection Time: 06/25/16  1:54 PM  Result Value Ref Range   Sodium 144 135 - 145 mmol/L   Potassium 3.2 (L) 3.5 - 5.1 mmol/L   Chloride 108 101 - 111 mmol/L   CO2 24 22 - 32 mmol/L   Glucose, Bld 125 (H) 65 - 99 mg/dL   BUN 13 6 - 20 mg/dL   Creatinine, Ser 1.35 (H) 0.44 - 1.00 mg/dL   Calcium 8.9 8.9 - 10.3 mg/dL   GFR calc non Af Amer 38 (L) >60 mL/min   GFR calc Af Amer 45 (L) >60 mL/min    Comment: (NOTE) The eGFR has been calculated using the CKD EPI equation. This calculation has not been validated in all clinical situations. eGFR's persistently <60 mL/min signify possible Chronic Kidney Disease.    Anion gap 12 5 - 15  CBC     Status: Abnormal   Collection Time: 06/25/16  1:54 PM  Result Value Ref Range   WBC 5.7 4.0 - 10.5  K/uL   RBC 4.72 3.87 - 5.11 MIL/uL   Hemoglobin 13.9 12.0 - 15.0 g/dL   HCT 43.7 36.0 - 46.0 %   MCV 92.6 78.0 - 100.0 fL   MCH 29.4 26.0 - 34.0 pg   MCHC 31.8 30.0 - 36.0 g/dL   RDW 16.5 (H) 11.5 - 15.5 %   Platelets 195 150 - 400 K/uL  I-stat troponin, ED     Status: None   Collection Time: 06/25/16  2:13 PM  Result Value Ref Range   Troponin i, poc 0.03 0.00 - 0.08 ng/mL   Comment 3            Comment: Due to the release kinetics of cTnI, a negative result within the first hours of the onset of symptoms does not rule out myocardial infarction with certainty. If myocardial infarction is still suspected, repeat the test at  appropriate intervals.    Dg Chest 2 View  Result Date: 06/25/2016 CLINICAL DATA:  Cough and short of breath EXAM: CHEST  2 VIEW COMPARISON:  02/19/2010 FINDINGS: Cardiac enlargement has progressed in the interval. Single lead pacemaker and defibrillator unchanged Pulmonary vascular congestion with mild interstitial edema. Small bilateral pleural effusions. IMPRESSION: Congestive heart failure with mild interstitial edema and small pleural effusions. Electronically Signed   By: Franchot Gallo M.D.   On: 06/25/2016 15:24    Review of Systems  Constitutional: Negative for chills and fever.  Eyes: Negative for blurred vision.  Respiratory: Positive for shortness of breath.   Cardiovascular: Positive for chest pain and orthopnea. Negative for palpitations.  Genitourinary: Negative for dysuria.  Neurological: Negative for dizziness and headaches.    Blood pressure 149/76, pulse 88, temperature 98.4 F (36.9 C), temperature source Oral, resp. rate 24, height 5' 4.5" (1.638 m), weight 155 lb (70.3 kg), SpO2 98 %. Physical Exam  HENT:  Head: Normocephalic and atraumatic.  Eyes: Conjunctivae and EOM are normal. Left eye exhibits no discharge. Scleral icterus is present.  Neck: Normal range of motion. Neck supple. JVD present. No tracheal deviation present.  Cardiovascular: Normal rate and regular rhythm.   Murmur (Soft systolic murmur and S3 gallop noted) heard. Respiratory:  Decreased breath sound at bases with bibasilar Rales noted  GI: Soft. Bowel sounds are normal. She exhibits no distension. There is no tenderness.  Musculoskeletal:  No clubbing cyanosis or edema     Assessment/Plan Acute systolic congestive heart failure Acute coronary syndrome Coronary artery disease history of inferior wall MI in the past status post PCI to RCA in the past Ischemic/nonischemic dilated cardiomyopathy status post ICD History of V. fib cardiac arrest in the past Hypertension Diabetes  mellitus Hyperlipidemia Hypothyroidism Chronic kidney disease stage II Plan As per orders  Charolette Forward, MD 06/25/2016, 5:19 PM

## 2016-06-25 NOTE — Progress Notes (Signed)
New pt admission from ED. Pt brought to the floor in stable condition. Vitals taken. Initial Assessment done. All immediate pertinent needs to patient addressed. Patient Guide given to patient. Important safety instructions relating to hospitalization reviewed with patient. Patient verbalized understanding. Will continue to monitor pt.  Rally Ouch RN 

## 2016-06-25 NOTE — ED Triage Notes (Signed)
Pt states for the past few weeks she has been increasingly sob.  Any activity increases her sob, such as buttoning her blouse.  O2 sats 98% on RA.  Pt also states she passed out while sitting and talking to her friend yesterday.  Pt states symptoms since cough began.

## 2016-06-26 ENCOUNTER — Encounter (HOSPITAL_COMMUNITY): Payer: Self-pay | Admitting: Rehabilitation

## 2016-06-26 LAB — LIPID PANEL
CHOL/HDL RATIO: 2.6 ratio
Cholesterol: 117 mg/dL (ref 0–200)
HDL: 45 mg/dL (ref >40)
LDL CALC: 55 mg/dL (ref 0–99)
TRIGLYCERIDES: 84 mg/dL (ref <150)
VLDL: 17 mg/dL (ref 0–40)

## 2016-06-26 LAB — GLUCOSE, CAPILLARY
GLUCOSE-CAPILLARY: 132 mg/dL — AB (ref 65–99)
GLUCOSE-CAPILLARY: 139 mg/dL — AB (ref 65–99)
Glucose-Capillary: 110 mg/dL — ABNORMAL HIGH (ref 65–99)
Glucose-Capillary: 120 mg/dL — ABNORMAL HIGH (ref 65–99)

## 2016-06-26 LAB — BASIC METABOLIC PANEL
ANION GAP: 9 (ref 5–15)
BUN: 14 mg/dL (ref 6–20)
CALCIUM: 8.4 mg/dL — AB (ref 8.9–10.3)
CHLORIDE: 106 mmol/L (ref 101–111)
CO2: 27 mmol/L (ref 22–32)
Creatinine, Ser: 1.4 mg/dL — ABNORMAL HIGH (ref 0.44–1.00)
GFR calc non Af Amer: 37 mL/min — ABNORMAL LOW (ref >60)
GFR, EST AFRICAN AMERICAN: 43 mL/min — AB (ref >60)
GLUCOSE: 116 mg/dL — AB (ref 65–99)
Potassium: 3.4 mmol/L — ABNORMAL LOW (ref 3.5–5.1)
Sodium: 142 mmol/L (ref 135–145)

## 2016-06-26 LAB — CBC
HCT: 38.5 % (ref 36.0–46.0)
Hemoglobin: 12.4 g/dL (ref 12.0–15.0)
MCH: 29 pg (ref 26.0–34.0)
MCHC: 32.2 g/dL (ref 30.0–36.0)
MCV: 90 fL (ref 78.0–100.0)
PLATELETS: 184 10*3/uL (ref 150–400)
RBC: 4.28 MIL/uL (ref 3.87–5.11)
RDW: 16.1 % — AB (ref 11.5–15.5)
WBC: 5.4 10*3/uL (ref 4.0–10.5)

## 2016-06-26 LAB — HEPARIN LEVEL (UNFRACTIONATED)
HEPARIN UNFRACTIONATED: 0.16 [IU]/mL — AB (ref 0.30–0.70)
HEPARIN UNFRACTIONATED: 0.41 [IU]/mL (ref 0.30–0.70)

## 2016-06-26 LAB — HEMOGLOBIN A1C
HEMOGLOBIN A1C: 6.2 % — AB (ref 4.8–5.6)
Mean Plasma Glucose: 131 mg/dL

## 2016-06-26 LAB — TROPONIN I
TROPONIN I: 0.04 ng/mL — AB (ref <0.03)
Troponin I: 0.04 ng/mL (ref <0.03)

## 2016-06-26 MED ORDER — ACETAMINOPHEN-CODEINE #3 300-30 MG PO TABS
2.0000 | ORAL_TABLET | Freq: Three times a day (TID) | ORAL | Status: DC | PRN
  Administered 2016-06-26 – 2016-06-27 (×2): 2 via ORAL
  Filled 2016-06-26 (×2): qty 2

## 2016-06-26 MED ORDER — PNEUMOCOCCAL VAC POLYVALENT 25 MCG/0.5ML IJ INJ
0.5000 mL | INJECTION | INTRAMUSCULAR | Status: AC
  Administered 2016-06-28: 0.5 mL via INTRAMUSCULAR
  Filled 2016-06-26: qty 0.5

## 2016-06-26 NOTE — Progress Notes (Signed)
ANTICOAGULATION CONSULT NOTE - Follow Up Consult  Pharmacy Consult for Heparin  Indication: chest pain/ACS  No Known Allergies  Patient Measurements: Height: 5\' 4"  (162.6 cm) Weight: 154 lb 1.6 oz (69.9 kg) IBW/kg (Calculated) : 54.7  Vital Signs: Temp: 97.8 F (36.6 C) (10/28 0113) Temp Source: Oral (10/28 0113) BP: 135/69 (10/28 0113) Pulse Rate: 94 (10/28 0113)  Labs:  Recent Labs  06/25/16 1354 06/25/16 1859 06/26/16 0004 06/26/16 0309  HGB 13.9  --   --   --   HCT 43.7  --   --   --   PLT 195  --   --   --   HEPARINUNFRC  --   --   --  0.16*  CREATININE 1.35*  --   --   --   TROPONINI  --  0.03* 0.04*  --     Estimated Creatinine Clearance: 36.7 mL/min (by C-G formula based on SCr of 1.35 mg/dL (H)).    Assessment: 70 y/o F with cardiac hx here with shortness of breath, on heparin, initial anti-Xa level is low, heparin off while changing IV earlier so will be less aggressive with rate increase.   Goal of Therapy:  Heparin level 0.3-0.7 units/ml Monitor platelets by anticoagulation protocol: Yes   Plan:  -Inc heparin to 950 units/hr -1300 HL  Joshue Badal 06/26/2016,4:38 AM

## 2016-06-26 NOTE — Progress Notes (Signed)
ANTICOAGULATION CONSULT NOTE - Follow Up Consult  Pharmacy Consult for Heparin  Indication: chest pain/ACS  No Known Allergies  Patient Measurements: Height: 5\' 4"  (162.6 cm) Weight: 154 lb 1.6 oz (69.9 kg) (Scale B) IBW/kg (Calculated) : 54.7  Vital Signs: Temp: 97.3 F (36.3 C) (10/28 1200) Temp Source: Oral (10/28 1200) BP: 97/63 (10/28 1200) Pulse Rate: 84 (10/28 1200)  Labs:  Recent Labs  06/25/16 1354 06/25/16 1859 06/26/16 0004 06/26/16 0309 06/26/16 0642 06/26/16 1232  HGB 13.9  --   --   --  12.4  --   HCT 43.7  --   --   --  38.5  --   PLT 195  --   --   --  184  --   HEPARINUNFRC  --   --   --  0.16*  --  0.41  CREATININE 1.35*  --   --   --  1.40*  --   TROPONINI  --  0.03* 0.04*  --  0.04*  --     Estimated Creatinine Clearance: 35.4 mL/min (by C-G formula based on SCr of 1.4 mg/dL (H)).  Assessment: 70 y/o F with cardiac hx here with shortness of breath, on heparin, initial anti-Xa level was low but now therapeutic at increased rate. CBC is WNL and no bleeding noted.   Goal of Therapy:  Heparin level 0.3-0.7 units/ml Monitor platelets by anticoagulation protocol: Yes   Plan:  - Continue heparin gtt 950 units/hr - Daily heparin level and CBC  Lysle Pearlachel Doneshia Hill, PharmD, BCPS Pager # [phone] 06/26/2016 1:37 PM

## 2016-06-26 NOTE — Progress Notes (Signed)
Subjective:  Patient denies any further chest pain states breathing is improved. Objective:  Vital Signs in the last 24 hours: Temp:  [97.8 F (36.6 C)-98.4 F (36.9 C)] 97.8 F (36.6 C) (10/28 0454) Pulse Rate:  [86-94] 93 (10/28 0454) Resp:  [13-24] 19 (10/28 0454) BP: (132-170)/(69-93) 132/79 (10/28 0454) SpO2:  [93 %-100 %] 98 % (10/28 0454) Weight:  [154 lb 1.6 oz (69.9 kg)-155 lb (70.3 kg)] 154 lb 1.6 oz (69.9 kg) (10/28 0454)  Intake/Output from previous day: 10/27 0701 - 10/28 0700 In: 360 [P.O.:360] Out: 1200 [Urine:1200] Intake/Output from this shift: Total I/O In: 240 [P.O.:240] Out: -   Physical Exam: Neck: no adenopathy, no carotid bruit, no JVD and supple, symmetrical, trachea midline Lungs: Decreased breath sound at bases Heart: regular rate and rhythm, S1, S2 normal and Soft systolic murmur and S3 gallop noted Abdomen: regular rate and rhythm, S1, S2 normal and Soft systolic murmur and S3 gallop noted Extremities: extremities normal, atraumatic, no cyanosis or edema  Lab Results:  Recent Labs  06/25/16 1354 06/26/16 0642  WBC 5.7 5.4  HGB 13.9 12.4  PLT 195 184    Recent Labs  06/25/16 1354 06/26/16 0642  NA 144 142  K 3.2* 3.4*  CL 108 106  CO2 24 27  GLUCOSE 125* 116*  BUN 13 14  CREATININE 1.35* 1.40*    Recent Labs  06/25/16 1859 06/26/16 0004  TROPONINI 0.03* 0.04*   Hepatic Function Panel No results for input(s): PROT, ALBUMIN, AST, ALT, ALKPHOS, BILITOT, BILIDIR, IBILI in the last 72 hours. No results for input(s): CHOL in the last 72 hours. No results for input(s): PROTIME in the last 72 hours.  Imaging: Imaging results have been reviewed and Dg Chest 2 View  Result Date: 06/25/2016 CLINICAL DATA:  Cough and short of breath EXAM: CHEST  2 VIEW COMPARISON:  02/19/2010 FINDINGS: Cardiac enlargement has progressed in the interval. Single lead pacemaker and defibrillator unchanged Pulmonary vascular congestion with mild  interstitial edema. Small bilateral pleural effusions. IMPRESSION: Congestive heart failure with mild interstitial edema and small pleural effusions. Electronically Signed   By: Marlan Palauharles  Clark M.D.   On: 06/25/2016 15:24    Cardiac Studies:  Assessment/Plan:  Resolving Acute systolic congestive heart failure Acute coronary syndrome Coronary artery disease history of inferior wall MI in the past status post PCI to RCA in the past Ischemic/nonischemic dilated cardiomyopathy status post ICD History of V. fib cardiac arrest in the past Hypertension Diabetes mellitus Hyperlipidemia Hypothyroidism Chronic kidney disease stage II Plan Continue present management Schedule for Lexiscan Myoview in a.m.  LOS: 1 day    Rinaldo CloudHarwani, Vlasta Baskin 06/26/2016, 10:04 AM

## 2016-06-27 LAB — CBC
HCT: 38.6 % (ref 36.0–46.0)
Hemoglobin: 12.4 g/dL (ref 12.0–15.0)
MCH: 29 pg (ref 26.0–34.0)
MCHC: 32.1 g/dL (ref 30.0–36.0)
MCV: 90.2 fL (ref 78.0–100.0)
PLATELETS: 168 10*3/uL (ref 150–400)
RBC: 4.28 MIL/uL (ref 3.87–5.11)
RDW: 16.3 % — AB (ref 11.5–15.5)
WBC: 5.7 10*3/uL (ref 4.0–10.5)

## 2016-06-27 LAB — GLUCOSE, CAPILLARY
Glucose-Capillary: 99 mg/dL (ref 65–99)
Glucose-Capillary: 143 mg/dL — ABNORMAL HIGH (ref 65–99)
Glucose-Capillary: 97 mg/dL (ref 65–99)

## 2016-06-27 LAB — HEPARIN LEVEL (UNFRACTIONATED)
HEPARIN UNFRACTIONATED: 0.28 [IU]/mL — AB (ref 0.30–0.70)
HEPARIN UNFRACTIONATED: 0.41 [IU]/mL (ref 0.30–0.70)

## 2016-06-27 LAB — BASIC METABOLIC PANEL
ANION GAP: 9 (ref 5–15)
BUN: 16 mg/dL (ref 6–20)
CALCIUM: 8.7 mg/dL — AB (ref 8.9–10.3)
CO2: 26 mmol/L (ref 22–32)
CREATININE: 1.39 mg/dL — AB (ref 0.44–1.00)
Chloride: 106 mmol/L (ref 101–111)
GFR, EST AFRICAN AMERICAN: 43 mL/min — AB (ref >60)
GFR, EST NON AFRICAN AMERICAN: 37 mL/min — AB (ref >60)
GLUCOSE: 99 mg/dL (ref 65–99)
Potassium: 3.8 mmol/L (ref 3.5–5.1)
Sodium: 141 mmol/L (ref 135–145)

## 2016-06-27 LAB — BRAIN NATRIURETIC PEPTIDE: B Natriuretic Peptide: 1461.3 pg/mL — ABNORMAL HIGH (ref 0.0–100.0)

## 2016-06-27 MED ORDER — FUROSEMIDE 10 MG/ML IJ SOLN
40.0000 mg | Freq: Two times a day (BID) | INTRAMUSCULAR | Status: DC
  Administered 2016-06-27 – 2016-06-29 (×5): 40 mg via INTRAVENOUS
  Filled 2016-06-27 (×5): qty 4

## 2016-06-27 NOTE — Progress Notes (Signed)
ANTICOAGULATION CONSULT NOTE - Follow Up Consult  Pharmacy Consult for Heparin  Indication: chest pain/ACS  No Known Allergies  Patient Measurements: Height: 5\' 4"  (162.6 cm) Weight: 156 lb (70.8 kg) (scale b) IBW/kg (Calculated) : 54.7  Vital Signs: Temp: 97.5 F (36.4 C) (10/29 1136) Temp Source: Oral (10/29 1136) BP: 131/81 (10/29 1136) Pulse Rate: 88 (10/29 1136)  Labs:  Recent Labs  06/25/16 1354 06/25/16 1859 06/26/16 0004  06/26/16 0642 06/26/16 1232 06/27/16 0427 06/27/16 1604  HGB 13.9  --   --   --  12.4  --  12.4  --   HCT 43.7  --   --   --  38.5  --  38.6  --   PLT 195  --   --   --  184  --  168  --   HEPARINUNFRC  --   --   --   < >  --  0.41 0.28* 0.41  CREATININE 1.35*  --   --   --  1.40*  --  1.39*  --   TROPONINI  --  0.03* 0.04*  --  0.04*  --   --   --   < > = values in this interval not displayed.  Estimated Creatinine Clearance: 35.8 mL/min (by C-G formula based on SCr of 1.39 mg/dL (H)).  Assessment: 70 y/o F with cardiac hx here with shortness of breath, on heparin. Heparin level is now at goal. No bleeding noted.   Goal of Therapy:  Heparin level 0.3-0.7 units/ml Monitor platelets by anticoagulation protocol: Yes   Plan:  - Continue heparin gtt to 1050 units/hr - AM level to confirm dosing - Daily heparin level and CBC  Lysle Pearlachel Millard Bautch, PharmD, BCPS Pager # [phone] 06/27/2016 4:51 PM

## 2016-06-27 NOTE — Progress Notes (Signed)
Patient had a 6 beats of V-tach and c/o SOB. O2 98%. MD notified.

## 2016-06-27 NOTE — Progress Notes (Signed)
Subjective:  Complains of shortness of breath and dizziness. Denies any chest pain. Had 6 beats of nonsustained VT early this morning. Lexiscan Myoview could not be done today as patient ate breakfast.  Objective:  Vital Signs in the last 24 hours: Temp:  [97.3 F (36.3 C)-98.6 F (37 C)] 98.6 F (37 C) (10/29 0628) Pulse Rate:  [79-90] 86 (10/29 1043) Resp:  [18] 18 (10/29 1043) BP: (97-143)/(60-84) 129/80 (10/29 1043) SpO2:  [95 %-100 %] 98 % (10/29 1043) Weight:  [156 lb (70.8 kg)] 156 lb (70.8 kg) (10/29 0628)  Intake/Output from previous day: 10/28 0701 - 10/29 0700 In: 1143.1 [P.O.:940; I.V.:203.1] Out: 300 [Urine:300] Intake/Output from this shift: Total I/O In: 360 [P.O.:360] Out: 100 [Urine:100]  Physical Exam: Neck: no adenopathy, no carotid bruit, no JVD and supple, symmetrical, trachea midline Lungs: Decreased breath sounds at bases with faint rales Heart: regular rate and rhythm, S1, S2 normal and Soft systolic murmur and S3 gallop noted Abdomen: regular rate and rhythm, S1, S2 normal and Soft systolic murmur and S3 gallop noted Extremities: extremities normal, atraumatic, no cyanosis or edema  Lab Results:  Recent Labs  06/26/16 0642 06/27/16 0427  WBC 5.4 5.7  HGB 12.4 12.4  PLT 184 168    Recent Labs  06/26/16 0642 06/27/16 0427  NA 142 141  K 3.4* 3.8  CL 106 106  CO2 27 26  GLUCOSE 116* 99  BUN 14 16  CREATININE 1.40* 1.39*    Recent Labs  06/26/16 0004 06/26/16 0642  TROPONINI 0.04* 0.04*   Hepatic Function Panel No results for input(s): PROT, ALBUMIN, AST, ALT, ALKPHOS, BILITOT, BILIDIR, IBILI in the last 72 hours.  Recent Labs  06/26/16 0642  CHOL 117   No results for input(s): PROTIME in the last 72 hours.  Imaging: Imaging results have been reviewed and Dg Chest 2 View  Result Date: 06/25/2016 CLINICAL DATA:  Cough and short of breath EXAM: CHEST  2 VIEW COMPARISON:  02/19/2010 FINDINGS: Cardiac enlargement has  progressed in the interval. Single lead pacemaker and defibrillator unchanged Pulmonary vascular congestion with mild interstitial edema. Small bilateral pleural effusions. IMPRESSION: Congestive heart failure with mild interstitial edema and small pleural effusions. Electronically Signed   By: Marlan Palauharles  Clark M.D.   On: 06/25/2016 15:24    Cardiac Studies:  Assessment/Plan:  Resolving Acute systolic congestive heart failure Stable angina Status post nonsustained VT Coronary artery disease history of inferior wall MI in the past status post PCI to RCA in the past Ischemic/nonischemic dilated cardiomyopathy status post ICD History of V. fib cardiac arrest in the past Hypertension Diabetes mellitus Hyperlipidemia Hypothyroidism Chronic kidney disease stage II Plan Increase Lasix as per orders Reschedule for Lexiscan Myoview in a.m.  LOS: 2 days    Rinaldo CloudHarwani, Alfreda Hammad 06/27/2016, 11:29 AM

## 2016-06-27 NOTE — Progress Notes (Signed)
ANTICOAGULATION CONSULT NOTE - Follow Up Consult  Pharmacy Consult for Heparin  Indication: chest pain/ACS  No Known Allergies  Patient Measurements: Height: 5\' 4"  (162.6 cm) Weight: 156 lb (70.8 kg) (scale b) IBW/kg (Calculated) : 54.7  Vital Signs: Temp: 98.6 F (37 C) (10/29 0628) Temp Source: Oral (10/29 0628) BP: 127/60 (10/29 0628) Pulse Rate: 83 (10/29 0628)  Labs:  Recent Labs  06/25/16 1354 06/25/16 1859 06/26/16 0004 06/26/16 0309 06/26/16 [id number] 06/26/16 1232 06/27/16 0427  HGB 13.9  --   --   --  12.4  --  12.4  HCT 43.7  --   --   --  38.5  --  38.6  PLT 195  --   --   --  184  --  168  HEPARINUNFRC  --   --   --  0.16*  --  0.41 0.28*  CREATININE 1.35*  --   --   --  1.40*  --  1.39*  TROPONINI  --  0.03* 0.04*  --  0.04*  --   --     Estimated Creatinine Clearance: 35.8 mL/min (by C-G formula based on SCr of 1.39 mg/dL (H)).  Assessment: 70 y/o F with cardiac hx here with shortness of breath, on heparin. AM heparin level slightly subtherapeutic CBC is stable and no bleeding noted.   Goal of Therapy:  Heparin level 0.3-0.7 units/ml Monitor platelets by anticoagulation protocol: Yes   Plan:  - Increase heparin gtt to 1050 units/hr - 8 hr heparin level - Daily heparin level and CBC  Sandi CarneNick Nadiya Pieratt, PharmD Pharmacy Resident Pager: [phone]  06/27/2016 7:20 AM

## 2016-06-28 ENCOUNTER — Inpatient Hospital Stay (HOSPITAL_COMMUNITY): Payer: Medicare HMO

## 2016-06-28 LAB — HEPARIN LEVEL (UNFRACTIONATED): Heparin Unfractionated: 0.29 IU/mL — ABNORMAL LOW (ref 0.30–0.70)

## 2016-06-28 LAB — GLUCOSE, CAPILLARY
GLUCOSE-CAPILLARY: 113 mg/dL — AB (ref 65–99)
GLUCOSE-CAPILLARY: 86 mg/dL (ref 65–99)
Glucose-Capillary: 129 mg/dL — ABNORMAL HIGH (ref 65–99)
Glucose-Capillary: 106 mg/dL — ABNORMAL HIGH (ref 65–99)
Glucose-Capillary: 127 mg/dL — ABNORMAL HIGH (ref 65–99)

## 2016-06-28 LAB — CBC
HCT: 37.2 % (ref 36.0–46.0)
Hemoglobin: 12.2 g/dL (ref 12.0–15.0)
MCH: 29.3 pg (ref 26.0–34.0)
MCHC: 32.8 g/dL (ref 30.0–36.0)
MCV: 89.4 fL (ref 78.0–100.0)
PLATELETS: 165 10*3/uL (ref 150–400)
RBC: 4.16 MIL/uL (ref 3.87–5.11)
RDW: 16.1 % — AB (ref 11.5–15.5)
WBC: 5.6 10*3/uL (ref 4.0–10.5)

## 2016-06-28 MED ORDER — TECHNETIUM TC 99M TETROFOSMIN IV KIT
30.0000 | PACK | Freq: Once | INTRAVENOUS | Status: AC | PRN
  Administered 2016-06-28: 30 via INTRAVENOUS

## 2016-06-28 MED ORDER — HEPARIN SODIUM (PORCINE) 5000 UNIT/ML IJ SOLN
5000.0000 [IU] | Freq: Three times a day (TID) | INTRAMUSCULAR | Status: DC
  Administered 2016-06-28 – 2016-06-29 (×2): 5000 [IU] via SUBCUTANEOUS
  Filled 2016-06-28 (×2): qty 1

## 2016-06-28 MED ORDER — REGADENOSON 0.4 MG/5ML IV SOLN
0.4000 mg | Freq: Once | INTRAVENOUS | Status: AC
  Administered 2016-06-28: 0.4 mg via INTRAVENOUS
  Filled 2016-06-28: qty 5

## 2016-06-28 MED ORDER — TECHNETIUM TC 99M TETROFOSMIN IV KIT
10.0000 | PACK | Freq: Once | INTRAVENOUS | Status: AC | PRN
  Administered 2016-06-28: 10 via INTRAVENOUS

## 2016-06-28 MED ORDER — REGADENOSON 0.4 MG/5ML IV SOLN
INTRAVENOUS | Status: AC
  Filled 2016-06-28: qty 5

## 2016-06-28 NOTE — Progress Notes (Signed)
Subjective:  Patient denies any chest pain states breathing is improving slowly. Tolerated stress portion of Lexiscan Myoview  Objective:  Vital Signs in the last 24 hours: Temp:  [97.4 F (36.3 C)-98.2 F (36.8 C)] 98.2 F (36.8 C) (10/30 0604) Pulse Rate:  [84-86] 86 (10/30 0604) Resp:  [14-16] 14 (10/30 0604) BP: (128-160)/(66-99) 149/82 (10/30 1109) SpO2:  [96 %-98 %] 96 % (10/30 0604) Weight:  [155 lb 11.2 oz (70.6 kg)] 155 lb 11.2 oz (70.6 kg) (10/30 0604)  Intake/Output from previous day: 10/29 0701 - 10/30 0700 In: 1007.7 [P.O.:780; I.V.:227.7] Out: 1100 [Urine:1100] Intake/Output from this shift: No intake/output data recorded.  Physical Exam: Neck: no adenopathy, no carotid bruit, no JVD and supple, symmetrical, trachea midline Lungs: Decreased breath sound at bases Heart: regular rate and rhythm, S1, S2 normal and Soft systolic murmur and S3 gallop noted Abdomen: soft, non-tender; bowel sounds normal; no masses,  no organomegaly Extremities: extremities normal, atraumatic, no cyanosis or edema  Lab Results:  Recent Labs  06/27/16 0427 06/28/16 0433  WBC 5.7 5.6  HGB 12.4 12.2  PLT 168 165    Recent Labs  06/26/16 0642 06/27/16 0427  NA 142 141  K 3.4* 3.8  CL 106 106  CO2 27 26  GLUCOSE 116* 99  BUN 14 16  CREATININE 1.40* 1.39*    Recent Labs  06/26/16 0004 06/26/16 0642  TROPONINI 0.04* 0.04*   Hepatic Function Panel No results for input(s): PROT, ALBUMIN, AST, ALT, ALKPHOS, BILITOT, BILIDIR, IBILI in the last 72 hours.  Recent Labs  06/26/16 0642  CHOL 117   No results for input(s): PROTIME in the last 72 hours.  Imaging: Imaging results have been reviewed and No results found.  Cardiac Studies:  Assessment/Plan:  Resolving Acute systolic congestive heart failure Stable angina Status post nonsustained VT Coronary artery disease history of inferior wall MI in the past status post PCI to RCA in the past Ischemic/nonischemic  dilated cardiomyopathy status post ICD History of V. fib cardiac arrest in the past Hypertension Diabetes mellitus Hyperlipidemia Hypothyroidism Chronic kidney disease stage II Plan Continue present management Check Lexiscan Myoview result  LOS: 3 days    Rinaldo CloudHarwani, Javi Bollman 06/28/2016, 11:51 AM

## 2016-06-28 NOTE — Progress Notes (Signed)
ANTICOAGULATION CONSULT NOTE - Follow Up Consult  Pharmacy Consult for Heparin  Indication: chest pain/ACS  No Known Allergies  Patient Measurements: Height: 5\' 4"  (162.6 cm) Weight: 155 lb 11.2 oz (70.6 kg) IBW/kg (Calculated) : 54.7  Vital Signs: Temp: 98.2 F (36.8 C) (10/30 0604) Temp Source: Oral (10/30 0604) BP: 136/86 (10/30 0604) Pulse Rate: 86 (10/30 0604)  Labs:  Recent Labs  06/25/16 1354 06/25/16 1859 06/26/16 0004  06/26/16 [id number]  06/27/16 0427 06/27/16 1604 06/28/16 0433  HGB 13.9  --   --   --  12.4  --  12.4  --  12.2  HCT 43.7  --   --   --  38.5  --  38.6  --  37.2  PLT 195  --   --   --  184  --  168  --  165  HEPARINUNFRC  --   --   --   < >  --   < > 0.28* 0.41 0.29*  CREATININE 1.35*  --   --   --  1.40*  --  1.39*  --   --   TROPONINI  --  0.03* 0.04*  --  0.04*  --   --   --   --   < > = values in this interval not displayed.  Estimated Creatinine Clearance: 35.8 mL/min (by C-G formula based on SCr of 1.39 mg/dL (H)).  Assessment: 70 y/o F with cardiac hx here with shortness of breath. On heparin for ACS. HL this am is slightly low at 0.29. CBC stable, no bleeding noted.  Goal of Therapy:  Heparin level 0.3-0.7 units/ml Monitor platelets by anticoagulation protocol: Yes   Plan:  Increase heparin gtt to 1,150 units/hr Check 8 hr HL Monitor daily HL, CBC, s/s of bleed  Enzo BiNathan Tyrease Vandeberg, PharmD, BCPS Clinical Pharmacist Pager [phone] 06/28/2016 8:30 AM

## 2016-06-28 NOTE — Progress Notes (Signed)
Spoke with Dr. Sharyn LullHarwani on phone regarding pt care. After reviewing stress test results per Dr. Sharyn LullHarwani pt Heparin gtt to be dc and started on heparin SQ. Will continue to monitor.

## 2016-06-29 LAB — GLUCOSE, CAPILLARY
GLUCOSE-CAPILLARY: 124 mg/dL — AB (ref 65–99)
Glucose-Capillary: 94 mg/dL (ref 65–99)

## 2016-06-29 MED ORDER — CARVEDILOL 25 MG PO TABS: 12.5000 mg | ORAL_TABLET | Freq: Two times a day (BID) | ORAL | 3 refills | Status: AC

## 2016-06-29 MED ORDER — NITROGLYCERIN 0.4 MG SL SUBL: 0.4000 mg | SUBLINGUAL_TABLET | SUBLINGUAL | 12 refills | Status: AC | PRN

## 2016-06-29 MED ORDER — SPIRONOLACTONE 25 MG PO TABS: 25.0000 mg | ORAL_TABLET | Freq: Every day | ORAL | 3 refills | Status: DC

## 2016-06-29 NOTE — Discharge Instructions (Signed)

## 2016-06-29 NOTE — Progress Notes (Signed)
Pt has orders to be discharged. Discharge instructions given and pt has no additional questions at this time. Medication regimen reviewed and pt educated. Pt verbalized understanding and has no additional questions. Telemetry box removed. IV removed and site in good condition. Pt stable and waiting for transportation.   Terrance Usery RN 

## 2016-06-29 NOTE — Progress Notes (Signed)
Heart Failure Navigator Consult Note  Presentation: Crystal Duran is 70 year old female with past medical history significant for coronary artery disease, history of injury wall MI status post PCI to RCA, ischemic/nonischemic dilated cardiomyopathy status post ICD History of cardiac arrest in the past, hypertension, hyperlipidemia, hypothyroidism, chronic kidney disease stage II, history of congestive heart failure secondary to depressed LV systolic function, came to the ER complaining of progressive worsening shortness of breath over the last 2 weeks. Patient also gives history of PND orthopnea but denies leg swelling. Also complains of vague retrosternal chest pain occasionally radiating to left arm took sublingual nitroglycerin with relief yesterday. Presently denies any chest pain. Patient was seen in my office recently because of vague chest pain and was scheduled for nuclear stress test which she missed. Patient denies any palpitations or ICD discharges but states had syncopal episode yesterday while talking to her friend. States she is recovering from a cold . Denies any fever or chills.   Past Medical History:  Diagnosis Date  . CAD (coronary artery disease)   . Cardiac arrest - ventricular fibrillation    aborted- 2011  . CHF (congestive heart failure) (HCC)   . History of tobacco abuse   . HTN (hypertension)   . Hypercholesteremia   . Hypothyroidism   . 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 Fortify ICD 123-40Q  . Ischemic cardiomyopathy    severe  . MI (myocardial infarction)    Hx of inferior wall MI   . Mild renal insufficiency   . S/P cardiac catheterization    s/p left cateterization with patent coronary arteries  . Systolic heart failure    compensated    Social History   Social History  . Marital status: Married    Spouse name: N/A  . Number of children: N/A  . Years of education: N/A   Social History  Main Topics  . Smoking status: Former Games developermoker  . Smokeless tobacco: Never Used     Comment: quit 10 years ago  . Alcohol use Yes     Comment: occ  . Drug use: No  . Sexual activity: Not Asked   Other Topics Concern  . None   Social History Narrative   Married, [place], retired Runner, broadcasting/film/videoteacher.      ECHO:Study Conclusions--02/10/16  - Left ventricle: The cavity size was mildly dilated. There was mild  concentric hypertrophy. Systolic function was moderately to  severely reduced. The estimated ejection fraction was in the range  of 30% to 35%. There is moderate hypokinesis of the entire  myocardium. There was an increased relative contribution of atrial  contraction to ventricular filling. - Aortic valve: Mild regurgitation. - Mitral valve: Moderate regurgitation. - Pericardium, extracardiac: A trivial pericardial effusion was  identified posterior to the heart. Transthoracic echocardiography. M-mode, complete 2D, spectral Doppler, and color Doppler. Height: Height: 163cm. Height: 64.2in. Weight: Weight: 97kg. Weight: 213.4lb. Body mass index: BMI: 36.5kg/m^2. Body surface area: BSA: 2.4716m^2. Blood pressure: 128/65. Patient status: Inpatient. Location: ICU/CCU  BNP    Component Value Date/Time   BNP 1,461.3 (H) 06/27/2016 0427    ProBNP    Component Value Date/Time   PROBNP 693.0 (H) 11/08/2008 1020     Education Assessment and Provision:  Detailed education and instructions provided on heart failure disease management including the following:  Signs and symptoms of Heart Failure When to call the physician Importance of daily weights Low sodium diet Fluid restriction Medication management Anticipated  future follow-up appointments  Patient education given on each of the above topics.  Patient acknowledges understanding and acceptance of all instructions.  I visited Crystal Duran and discussed HF and her current hospitalization.  She has a  scale and weighs each day.  I reviewed the importance of daily weights and how weight increases relate to the signs and symptoms of HF.  I reinforced a low sodium diet and high sodium foods to avoid.  She denies any issues getting or taking prescribed medications.  She sees Crystal Duran as an outpatient.  Education Materials:  "Living Better With Heart Failure" Booklet, Daily Weight Tracker Tool .   High Risk Criteria for Readmission and/or Poor Patient Outcomes:   EF <30%- 30-35%  2 or more admissions in 6 months- 1/6 mo  Difficult social situation- No  Demonstrates medication noncompliance- No denies   Barriers of Care:  Knowledge and compliance  Discharge Planning:   Plans to return to home alone

## 2016-06-29 NOTE — Care Management Important Message (Signed)
Important Message  Patient Details  Name: Crystal Duran MRN: [id number] Date of Birth: 02/14/1946   Medicare Important Message Given:  Yes    Dre Gamino 06/29/2016, 12:30 PM

## 2016-06-29 NOTE — Discharge Summary (Signed)
Discharge summary dictated on 06/29/2016 dictation number is [id number]

## 2016-06-30 NOTE — Discharge Summary (Signed)
NAMMarjie Duran:  Crystal Duran, Crystal Duran             ACCOUNT NO.:  [id number]  MEDICAL RECORD NO.:  [id number]  LOCATION:  [id number]                        FACILITY:  [facility]  PHYSICIAN:  Amadi Yoshino N. Sharyn Duran, M.D. DATE OF BIRTH:  11-02-1945  DATE OF ADMISSION:  06/25/2016 DATE OF DISCHARGE:  06/29/2016                              DISCHARGE SUMMARY   ADMITTING DIAGNOSES: 1. Acute systolic congestive heart failure. 2. Acute coronary syndrome. 3. Coronary artery disease, history of inferior wall myocardial     infarction in the past status post PTCA stenting to RCA in the     past. 4. Ischemic/nonischemic dilated cardiomyopathy status post ICD. 5. History of ventricular fibrillation cardiac arrest in the past. 6. Hypertension. 7. Diabetes mellitus. 8. Hyperlipidemia. 9. Hypothyroidism. 10.Chronic kidney disease, stage 2.  FINAL DIAGNOSIS: 1. Compensated systolic congestive heart failure. 2. Stable angina, negative nuclear stress test for reversible     ischemia. 3. Coronary artery disease, history of inferior wall myocardial     infarction in the past status post PCI to RCA in the past. 4. Ischemic/nonischemic dilated cardiomyopathy status post ICD in the     past. 5. History of ventricular fibrillation cardiac arrest in the past. 6. Hypertension. 7. Diabetes mellitus, controlled by diet. 8. Hyperlipidemia. 9. Hypothyroidism. 10.Chronic kidney disease, stage 2.  DISCHARGE HOME MEDICATIONS: 1. Nitrostat 0.4 mg sublingual use as directed. 2. Spironolactone 25 mg 1 tablet daily. 3. Aspirin 81 mg 1 tablet daily. 4. Lasix 40 mg twice daily. 5. Levothyroxine 150 mcg daily. 6. Ramipril 10 mg daily. 7. Crestor 20 mg daily. 8. Carvedilol 12.5 mg twice daily.  DIET:  Low salt, low cholesterol 1800 calories ADA diet.  Heart failure instructions have been given.  FOLLOWUP:  Follow up with me in 1 week.  CONDITION AT DISCHARGE:  Stable.  BRIEF HISTORY AND HOSPITAL COURSE:  Ms. Crystal Duran is a  70 year old female with past medical history significant for coronary artery disease, history of inferior wall MI status post PCI to RCA in the past, ischemic/nonischemic dilated cardiomyopathy status post ICD, history of cardiac arrest in the past, hypertension, hyperlipidemia, diabetes mellitus controlled by diet, hypothyroidism, chronic kidney disease stage 2, history of congestive heart failure secondary to depressed LV systolic function.  She came to the ER complaining of progressive increasing shortness of breath over the past 2 weeks.  Also gives history of PND, orthopnea, but denies leg swelling.  Also complains of vague retrosternal chest pain, occasionally radiating to the left arm, took sublingual nitro with relief yesterday.  The patient presently denies any chest pain.  The patient was seen in my office recently because of vague chest pain and was scheduled for nuclear stress test which she missed.  The patient denies any palpitation or ICD discharges, but states had syncopal episode yesterday while talking to a friend, states she is recovering from cold.  Denies any fever or chills.  PHYSICAL EXAMINATION:  GENERAL:  She was alert, awake, oriented x3, in no acute distress. VITAL SIGNS:  Blood pressure was 149/76, pulse 88 and irregular. HEENT:  Conjunctivae pink. NECK:  Supple.  Positive JVD. LUNGS:  She has decreased breath sound at bases with bibasilar rales. CARDIOVASCULAR:  S1, S2  normal.  There were soft systolic murmur and S3 gallop. ABDOMEN:  Soft.  Bowel sounds present.  Nontender. EXTREMITIES:  There was no clubbing, cyanosis, or edema.  LABORATORY DATA:  Sodium was 142, potassium 3.4, BUN 14, creatinine 1.40.  Her BNP was 2526, repeat BNP was 1871, 1461, which is trending down.  Her last sodium was 141, potassium 3.8, BUN 16, creatinine 1.39. Hemoglobin was 12.4, hematocrit 38.6, white count of 5.7.  Hemoglobin A1c was 6.2.  Her cholesterol was 117,  triglycerides 84, HDL 45, LDL was 55.  Her nuclear stress test showed no reversible ischemia although there was large fixed defect involving the inferior wall and a small fixed defect in the anterior septal wall towards the apex, EF of 21%.  BRIEF HOSPITAL COURSE:  The patient was admitted to telemetry unit.  MI was ruled out by serial enzymes and EKG.  The patient did not have any further episodes of chest pain during the hospital stay.  The patient was placed on IV Lasix with good diuresis.  Subsequently, underwent nuclear stress test as above.  The patient has been ambulating in hallway without any problems.  Her breathing has markedly improved.  The patient will be discharged home on above medications and will be followed up in my office in 1 week.     Crystal OsierMohan N. Sharyn Duran, M.D.     [facility]/MEDQ  D:  06/29/2016  T:  06/30/2016  Job:  [id number]

## 2016-07-02 ENCOUNTER — Telehealth (HOSPITAL_COMMUNITY): Payer: Self-pay | Admitting: Surgery

## 2016-07-02 NOTE — Telephone Encounter (Signed)
I attempted to contact patient after her recent hospitalization.  I left a message and will try her again next week.

## 2017-03-01 ENCOUNTER — Telehealth: Payer: Self-pay

## 2017-03-01 NOTE — Telephone Encounter (Signed)
2nd attempt to reach pt regarding shock from 02/28/2017.

## 2017-03-01 NOTE — Telephone Encounter (Signed)
Spoke with Mr. Russella DarMicheal, he stated that [name] had gone to the store, left device clinic number with Mr. [name] to have pt call back. Also left VM on pts phone.

## 2017-05-31 ENCOUNTER — Other Ambulatory Visit: Payer: Self-pay | Admitting: Cardiology

## 2017-05-31 DIAGNOSIS — R079 Chest pain, unspecified: Secondary | ICD-10-CM

## 2017-06-10 ENCOUNTER — Encounter (HOSPITAL_COMMUNITY)
Admission: RE | Admit: 2017-06-10 | Discharge: 2017-06-10 | Disposition: A | Discharge status: Home / Self Care | Payer: Medicare PPO | Source: Ambulatory Visit | Attending: Cardiology | Admitting: Cardiology

## 2017-06-10 DIAGNOSIS — R079 Chest pain, unspecified: Secondary | ICD-10-CM | POA: Insufficient documentation

## 2017-06-10 MED ORDER — TECHNETIUM TC 99M TETROFOSMIN IV KIT
10.0000 | PACK | Freq: Once | INTRAVENOUS | Status: AC | PRN
  Administered 2017-06-10: 10 via INTRAVENOUS

## 2017-06-10 MED ORDER — TECHNETIUM TC 99M TETROFOSMIN IV KIT
30.0000 | PACK | Freq: Once | INTRAVENOUS | Status: AC | PRN
  Administered 2017-06-10: 30 via INTRAVENOUS

## 2017-06-10 MED ORDER — REGADENOSON 0.4 MG/5ML IV SOLN
INTRAVENOUS | Status: AC
  Administered 2017-06-10: 0.4 mg via INTRAVENOUS
  Filled 2017-06-10: qty 5

## 2017-06-10 MED ORDER — REGADENOSON 0.4 MG/5ML IV SOLN
0.4000 mg | Freq: Once | INTRAVENOUS | Status: AC
Start: 2017-06-10 — End: 2017-06-10
  Administered 2017-06-10: 0.4 mg via INTRAVENOUS

## 2017-06-30 ENCOUNTER — Telehealth: Payer: Self-pay | Admitting: Cardiology

## 2017-06-30 NOTE — Telephone Encounter (Signed)
LMOVM requesting that pt send manual transmission b/c home monitor has not updated in at least 7 days.    

## 2017-06-30 NOTE — Telephone Encounter (Signed)
Opened in error

## 2017-07-07 ENCOUNTER — Telehealth: Payer: Self-pay | Admitting: Cardiology

## 2017-07-07 NOTE — Telephone Encounter (Signed)
Spoke w/ pt and requested that she send a manual transmission b/c her home monitor has not updated in at least 7 days.   

## 2017-07-11 ENCOUNTER — Other Ambulatory Visit: Payer: Self-pay

## 2017-07-11 ENCOUNTER — Emergency Department (HOSPITAL_COMMUNITY): Payer: Medicare PPO

## 2017-07-11 ENCOUNTER — Inpatient Hospital Stay (HOSPITAL_COMMUNITY)
Admission: EM | Admit: 2017-07-11 | Discharge: 2017-07-15 | DRG: 291 | Disposition: A | Discharge status: Home / Self Care | Payer: Medicare PPO | Attending: Internal Medicine | Admitting: Internal Medicine

## 2017-07-11 ENCOUNTER — Encounter (HOSPITAL_COMMUNITY): Payer: Self-pay | Admitting: Emergency Medicine

## 2017-07-11 DIAGNOSIS — E1122 Type 2 diabetes mellitus with diabetic chronic kidney disease: Secondary | ICD-10-CM | POA: Diagnosis present

## 2017-07-11 DIAGNOSIS — E785 Hyperlipidemia, unspecified: Secondary | ICD-10-CM | POA: Diagnosis present

## 2017-07-11 DIAGNOSIS — I252 Old myocardial infarction: Secondary | ICD-10-CM

## 2017-07-11 DIAGNOSIS — I2510 Atherosclerotic heart disease of native coronary artery without angina pectoris: Secondary | ICD-10-CM | POA: Diagnosis present

## 2017-07-11 DIAGNOSIS — I130 Hypertensive heart and chronic kidney disease with heart failure and stage 1 through stage 4 chronic kidney disease, or unspecified chronic kidney disease: Secondary | ICD-10-CM | POA: Diagnosis not present

## 2017-07-11 DIAGNOSIS — E876 Hypokalemia: Secondary | ICD-10-CM | POA: Diagnosis not present

## 2017-07-11 DIAGNOSIS — Z955 Presence of coronary angioplasty implant and graft: Secondary | ICD-10-CM

## 2017-07-11 DIAGNOSIS — Z7982 Long term (current) use of aspirin: Secondary | ICD-10-CM

## 2017-07-11 DIAGNOSIS — E119 Type 2 diabetes mellitus without complications: Secondary | ICD-10-CM | POA: Diagnosis not present

## 2017-07-11 DIAGNOSIS — N189 Chronic kidney disease, unspecified: Secondary | ICD-10-CM | POA: Diagnosis not present

## 2017-07-11 DIAGNOSIS — R0789 Other chest pain: Secondary | ICD-10-CM | POA: Diagnosis not present

## 2017-07-11 DIAGNOSIS — Z7984 Long term (current) use of oral hypoglycemic drugs: Secondary | ICD-10-CM | POA: Diagnosis not present

## 2017-07-11 DIAGNOSIS — E0590 Thyrotoxicosis, unspecified without thyrotoxic crisis or storm: Secondary | ICD-10-CM | POA: Diagnosis present

## 2017-07-11 DIAGNOSIS — I420 Dilated cardiomyopathy: Secondary | ICD-10-CM | POA: Diagnosis not present

## 2017-07-11 DIAGNOSIS — E039 Hypothyroidism, unspecified: Secondary | ICD-10-CM | POA: Diagnosis not present

## 2017-07-11 DIAGNOSIS — I255 Ischemic cardiomyopathy: Secondary | ICD-10-CM | POA: Diagnosis present

## 2017-07-11 DIAGNOSIS — Z95810 Presence of automatic (implantable) cardiac defibrillator: Secondary | ICD-10-CM | POA: Diagnosis not present

## 2017-07-11 DIAGNOSIS — I5023 Acute on chronic systolic (congestive) heart failure: Secondary | ICD-10-CM | POA: Diagnosis not present

## 2017-07-11 DIAGNOSIS — I509 Heart failure, unspecified: Secondary | ICD-10-CM

## 2017-07-11 DIAGNOSIS — N183 Chronic kidney disease, stage 3 (moderate): Secondary | ICD-10-CM | POA: Diagnosis present

## 2017-07-11 DIAGNOSIS — Z87891 Personal history of nicotine dependence: Secondary | ICD-10-CM | POA: Diagnosis not present

## 2017-07-11 DIAGNOSIS — I5021 Acute systolic (congestive) heart failure: Secondary | ICD-10-CM

## 2017-07-11 DIAGNOSIS — E7800 Pure hypercholesterolemia, unspecified: Secondary | ICD-10-CM | POA: Diagnosis not present

## 2017-07-11 DIAGNOSIS — R739 Hyperglycemia, unspecified: Secondary | ICD-10-CM | POA: Diagnosis present

## 2017-07-11 DIAGNOSIS — I10 Essential (primary) hypertension: Secondary | ICD-10-CM | POA: Diagnosis not present

## 2017-07-11 DIAGNOSIS — E441 Mild protein-calorie malnutrition: Secondary | ICD-10-CM | POA: Diagnosis not present

## 2017-07-11 DIAGNOSIS — Z6823 Body mass index (BMI) 23.0-23.9, adult: Secondary | ICD-10-CM | POA: Diagnosis not present

## 2017-07-11 DIAGNOSIS — E875 Hyperkalemia: Secondary | ICD-10-CM | POA: Diagnosis not present

## 2017-07-11 DIAGNOSIS — N179 Acute kidney failure, unspecified: Secondary | ICD-10-CM | POA: Diagnosis not present

## 2017-07-11 DIAGNOSIS — I447 Left bundle-branch block, unspecified: Secondary | ICD-10-CM | POA: Diagnosis present

## 2017-07-11 DIAGNOSIS — R000 Tachycardia, unspecified: Secondary | ICD-10-CM | POA: Diagnosis present

## 2017-07-11 HISTORY — DX: Type 2 diabetes mellitus without complications: E11.9

## 2017-07-11 LAB — COMPREHENSIVE METABOLIC PANEL
ALBUMIN: 3.2 g/dL — AB (ref 3.5–5.0)
ALT: 15 U/L (ref 14–54)
AST: 19 U/L (ref 15–41)
Alkaline Phosphatase: 72 U/L (ref 38–126)
Anion gap: 13 (ref 5–15)
BILIRUBIN TOTAL: 1.2 mg/dL (ref 0.3–1.2)
BUN: 24 mg/dL — AB (ref 6–20)
CHLORIDE: 106 mmol/L (ref 101–111)
CO2: 22 mmol/L (ref 22–32)
Calcium: 8.6 mg/dL — ABNORMAL LOW (ref 8.9–10.3)
Creatinine, Ser: 1.65 mg/dL — ABNORMAL HIGH (ref 0.44–1.00)
GFR calc Af Amer: 35 mL/min — ABNORMAL LOW (ref >60)
GFR calc non Af Amer: 30 mL/min — ABNORMAL LOW (ref >60)
GLUCOSE: 182 mg/dL — AB (ref 65–99)
POTASSIUM: 3.3 mmol/L — AB (ref 3.5–5.1)
Sodium: 141 mmol/L (ref 135–145)
TOTAL PROTEIN: 6.6 g/dL (ref 6.5–8.1)

## 2017-07-11 LAB — CBC WITH DIFFERENTIAL/PLATELET
BASOS ABS: 0 10*3/uL (ref 0.0–0.1)
BASOS PCT: 1 %
Basophils Absolute: 0 10*3/uL (ref 0.0–0.1)
Basophils Relative: 1 %
EOS ABS: 0 10*3/uL (ref 0.0–0.7)
EOS PCT: 1 %
Eosinophils Absolute: 0.1 10*3/uL (ref 0.0–0.7)
Eosinophils Relative: 1 %
HCT: 39.2 % (ref 36.0–46.0)
HCT: 39.7 % (ref 36.0–46.0)
Hemoglobin: 13.2 g/dL (ref 12.0–15.0)
Hemoglobin: 13.3 g/dL (ref 12.0–15.0)
LYMPHS ABS: 1.3 10*3/uL (ref 0.7–4.0)
LYMPHS PCT: 27 %
Lymphocytes Relative: 20 %
Lymphs Abs: 1.3 10*3/uL (ref 0.7–4.0)
MCH: 30.6 pg (ref 26.0–34.0)
MCH: 30.2 pg (ref 26.0–34.0)
MCHC: 33.7 g/dL (ref 30.0–36.0)
MCHC: 33.5 g/dL (ref 30.0–36.0)
MCV: 91 fL (ref 78.0–100.0)
MCV: 90.2 fL (ref 78.0–100.0)
MONO ABS: 0.3 10*3/uL (ref 0.1–1.0)
MONO ABS: 0.5 10*3/uL (ref 0.1–1.0)
MONOS PCT: 7 %
Monocytes Relative: 6 %
Neutro Abs: 3 10*3/uL (ref 1.7–7.7)
Neutro Abs: 4.7 10*3/uL (ref 1.7–7.7)
Neutrophils Relative %: 65 %
Neutrophils Relative %: 71 %
PLATELETS: 174 10*3/uL (ref 150–400)
Platelets: 179 10*3/uL (ref 150–400)
RBC: 4.31 MIL/uL (ref 3.87–5.11)
RBC: 4.4 MIL/uL (ref 3.87–5.11)
RDW: 16 % — AB (ref 11.5–15.5)
RDW: 15.9 % — AB (ref 11.5–15.5)
WBC: 4.6 10*3/uL (ref 4.0–10.5)
WBC: 6.5 10*3/uL (ref 4.0–10.5)

## 2017-07-11 LAB — URINALYSIS, ROUTINE W REFLEX MICROSCOPIC
Bilirubin Urine: NEGATIVE
Glucose, UA: NEGATIVE mg/dL
Ketones, ur: NEGATIVE mg/dL
Nitrite: NEGATIVE
PH: 5 (ref 5.0–8.0)
Protein, ur: 30 mg/dL — AB
SPECIFIC GRAVITY, URINE: 1.011 (ref 1.005–1.030)

## 2017-07-11 LAB — HEMOGLOBIN A1C
HEMOGLOBIN A1C: 6.2 % — AB (ref 4.8–5.6)
MEAN PLASMA GLUCOSE: 131.24 mg/dL

## 2017-07-11 LAB — BRAIN NATRIURETIC PEPTIDE: B Natriuretic Peptide: >4500 pg/mL — ABNORMAL HIGH (ref 0.0–100.0)

## 2017-07-11 LAB — GLUCOSE, CAPILLARY: Glucose-Capillary: 155 mg/dL — ABNORMAL HIGH (ref 65–99)

## 2017-07-11 LAB — TSH: TSH: 0.036 u[IU]/mL — AB (ref 0.350–4.500)

## 2017-07-11 MED ORDER — ENOXAPARIN SODIUM 30 MG/0.3ML ~~LOC~~ SOLN
30.0000 mg | SUBCUTANEOUS | Status: DC
  Administered 2017-07-11 – 2017-07-14 (×4): 30 mg via SUBCUTANEOUS
  Filled 2017-07-11 (×4): qty 0.3

## 2017-07-11 MED ORDER — ACETAMINOPHEN 325 MG PO TABS
650.0000 mg | ORAL_TABLET | ORAL | Status: DC | PRN
  Administered 2017-07-12 – 2017-07-13 (×4): 650 mg via ORAL
  Filled 2017-07-11 (×5): qty 2

## 2017-07-11 MED ORDER — SPIRONOLACTONE 25 MG PO TABS
25.0000 mg | ORAL_TABLET | Freq: Every day | ORAL | Status: DC
  Administered 2017-07-12 – 2017-07-13 (×2): 25 mg via ORAL
  Filled 2017-07-11 (×3): qty 1

## 2017-07-11 MED ORDER — INSULIN ASPART 100 UNIT/ML ~~LOC~~ SOLN
0.0000 [IU] | Freq: Three times a day (TID) | SUBCUTANEOUS | Status: DC
  Administered 2017-07-12: 2 [IU] via SUBCUTANEOUS
  Administered 2017-07-13 – 2017-07-15 (×3): 3 [IU] via SUBCUTANEOUS

## 2017-07-11 MED ORDER — CARVEDILOL 12.5 MG PO TABS
12.5000 mg | ORAL_TABLET | Freq: Two times a day (BID) | ORAL | Status: DC
  Administered 2017-07-11 – 2017-07-15 (×8): 12.5 mg via ORAL
  Filled 2017-07-11 (×8): qty 1

## 2017-07-11 MED ORDER — ASPIRIN 81 MG PO CHEW
81.0000 mg | CHEWABLE_TABLET | Freq: Every day | ORAL | Status: DC
  Administered 2017-07-12 – 2017-07-15 (×4): 81 mg via ORAL
  Filled 2017-07-11 (×4): qty 1

## 2017-07-11 MED ORDER — SODIUM CHLORIDE 0.9 % IV SOLN: 250.0000 mL | INTRAVENOUS | Status: DC | PRN

## 2017-07-11 MED ORDER — FUROSEMIDE 10 MG/ML IJ SOLN
40.0000 mg | Freq: Once | INTRAMUSCULAR | Status: AC
  Administered 2017-07-11: 40 mg via INTRAVENOUS
  Filled 2017-07-11: qty 4

## 2017-07-11 MED ORDER — LEVOTHYROXINE SODIUM 175 MCG PO TABS
175.0000 ug | ORAL_TABLET | Freq: Every day | ORAL | Status: DC
  Administered 2017-07-12: 175 ug via ORAL
  Filled 2017-07-11: qty 1

## 2017-07-11 MED ORDER — SODIUM CHLORIDE 0.9% FLUSH: 3.0000 mL | INTRAVENOUS | Status: DC | PRN

## 2017-07-11 MED ORDER — INSULIN ASPART 100 UNIT/ML ~~LOC~~ SOLN: 0.0000 [IU] | Freq: Every day | SUBCUTANEOUS | Status: DC

## 2017-07-11 MED ORDER — FUROSEMIDE 10 MG/ML IJ SOLN
40.0000 mg | Freq: Two times a day (BID) | INTRAMUSCULAR | Status: DC
  Administered 2017-07-12 – 2017-07-15 (×7): 40 mg via INTRAVENOUS
  Filled 2017-07-11 (×7): qty 4

## 2017-07-11 MED ORDER — ALBUTEROL SULFATE (2.5 MG/3ML) 0.083% IN NEBU
5.0000 mg | INHALATION_SOLUTION | Freq: Once | RESPIRATORY_TRACT | Status: AC
  Administered 2017-07-11: 5 mg via RESPIRATORY_TRACT
  Filled 2017-07-11: qty 6

## 2017-07-11 MED ORDER — ASPIRIN 81 MG PO TABS: 81.0000 mg | ORAL_TABLET | Freq: Every day | ORAL | Status: DC

## 2017-07-11 MED ORDER — INFLUENZA VAC SPLIT HIGH-DOSE 0.5 ML IM SUSY
0.5000 mL | PREFILLED_SYRINGE | INTRAMUSCULAR | Status: DC
  Filled 2017-07-11: qty 0.5

## 2017-07-11 MED ORDER — SODIUM CHLORIDE 0.9% FLUSH
3.0000 mL | Freq: Two times a day (BID) | INTRAVENOUS | Status: DC
  Administered 2017-07-11 – 2017-07-15 (×7): 3 mL via INTRAVENOUS

## 2017-07-11 MED ORDER — ONDANSETRON HCL 4 MG/2ML IJ SOLN: 4.0000 mg | Freq: Four times a day (QID) | INTRAMUSCULAR | Status: DC | PRN

## 2017-07-11 NOTE — ED Provider Notes (Signed)
[facility] EMERGENCY DEPARTMENT Provider Note   CSN: [id number] Arrival date & time: 07/11/17  1627     History   Chief Complaint Chief Complaint  Patient presents with  . Shortness of Breath    HPI Crystal Duran is a 71 y.o. female.  HPI  Patient with history of coronary artery disease, CHF with EF of 35%, AICD placement, CAD comes in with chief complaint of shortness of breath.  Patient reports that over the past 2 or 3 weeks she has been having worsening shortness of breath.  Prior to the onset of her symptoms patient had a stress test which was negative.  Patient reports that she has been using 3 pillows rather than normal to.  She has been getting short of breath with minimal exertion, including just putting her clothes on.  Patient has been taking her medications as prescribed however she has had reduced urine output over the past few days.  Patient has a cough but it is producing clear phlegm.  No fevers, chills, flulike symptoms.  Pt has no hx of PE, DVT and denies any exogenous hormone (testosterone / estrogen) use, long distance travels or surgery in the past 6 weeks, active cancer, recent immobilization.   Past Medical History:  Diagnosis Date  . CAD (coronary artery disease)   . Cardiac arrest - ventricular fibrillation    aborted- 2011  . CHF (congestive heart failure) (HCC)   . History of tobacco abuse   . HTN (hypertension)   . Hypercholesteremia   . Hypothyroidism   . 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 Jude Fortify ICD 123-40Q  . Ischemic cardiomyopathy    severe  . MI (myocardial infarction) (HCC)    Hx of inferior wall MI   . Mild renal insufficiency   . S/P cardiac catheterization    s/p left cateterization with patent coronary arteries  . Systolic heart failure    compensated    Patient Active Problem List   Diagnosis Date Noted  . Acute systolic congestive heart  failure (HCC) 06/25/2016  . aborted cardiac arrest 03/22/2011  . Chronic systolic congestive heart failure (HCC) 03/02/2011  . HYPERTENSION, BENIGN 06/01/2010  . CAD 06/01/2010  . CARDIOMYOPATHY, ISCHEMIC 06/01/2010  . IMPLANTATION OF DEFIBRILLATOR,ST. JUDE FORTIFY ICD 123-40Q 06/01/2010    Past Surgical History:  Procedure Laterality Date  . defibrillator implantation     single chamber. St Jude Fortify ICD [id number]. Remote- no.   . unspecified area hysterectomy      OB History    No data available       Home Medications    Prior to Admission medications   Medication Sig Start Date End Date Taking? Authorizing Provider  aspirin 81 MG tablet Take 81 mg by mouth daily.      [provider]  carvedilol (COREG) 25 MG tablet Take 0.5 tablets (12.5 mg total) by mouth 2 (two) times daily. 06/29/16   Rinaldo Cloud, MD  furosemide (LASIX) 40 MG tablet Take 40 mg by mouth 2 (two) times daily.      [provider]  levothyroxine (SYNTHROID, LEVOTHROID) 150 MCG tablet Take 150 mcg by mouth daily before breakfast.    [provider]  nitroGLYCERIN (NITROSTAT) 0.4 MG SL tablet Place 1 tablet (0.4 mg total) under the tongue every 5 (five) minutes x 3 doses as needed for chest pain. 06/29/16   Rinaldo Cloud, MD  ramipril (ALTACE) 10 MG capsule Take 10  mg by mouth daily.     [provider]  rosuvastatin (CRESTOR) 20 MG tablet Take 20 mg by mouth daily.      [provider]  spironolactone (ALDACTONE) 25 MG tablet Take 1 tablet (25 mg total) by mouth daily. 06/29/16   Rinaldo CloudHarwani, Mohan, MD    Family History Family History  Problem Relation Age of Onset  . Coronary artery disease Other        postive family Hx    Social History Social History   Tobacco Use  . Smoking status: Former Games developermoker  . Smokeless tobacco: Never Used  . Tobacco comment: quit 10 years ago  Substance Use Topics  . Alcohol use: Yes    Comment: occ  . Drug use: No      Allergies   Patient has no known allergies.   Review of Systems Review of Systems  Constitutional: Positive for activity change.  Respiratory: Positive for shortness of breath.   Cardiovascular: Negative for chest pain.  Gastrointestinal: Positive for abdominal pain. Negative for nausea and vomiting.  Allergic/Immunologic: Negative for immunocompromised state.  All other systems reviewed and are negative.    Physical Exam Updated Vital Signs BP (!) 156/84 (BP Location: Right Arm)   Pulse (!) 103   Resp (!) 22   SpO2 100%   Physical Exam  Constitutional: She is oriented to person, place, and time. She appears well-developed.  HENT:  Head: Normocephalic and atraumatic.  Eyes: EOM are normal.  Neck: Normal range of motion. Neck supple.  Cardiovascular: Normal rate.  Pulmonary/Chest: Effort normal. She has rales in the right lower field and the left lower field.  Abdominal: Bowel sounds are normal.  Musculoskeletal:       Right lower leg: She exhibits no tenderness and no edema.       Left lower leg: She exhibits no tenderness and no edema.  Neurological: She is alert and oriented to person, place, and time.  Skin: Skin is warm and dry.  Nursing note and vitals reviewed.    ED Treatments / Results  Labs (all labs ordered are listed, but only abnormal results are displayed) Labs Reviewed  COMPREHENSIVE METABOLIC PANEL - Abnormal; Notable for the following components:      Result Value   Potassium 3.3 (*)    Glucose, Bld 182 (*)    BUN 24 (*)    Creatinine, Ser 1.65 (*)    Calcium 8.6 (*)    Albumin 3.2 (*)    GFR calc non Af Amer 30 (*)    GFR calc Af Amer 35 (*)    All other components within normal limits  CBC WITH DIFFERENTIAL/PLATELET - Abnormal; Notable for the following components:   RDW 16.0 (*)    All other components within normal limits  URINALYSIS, ROUTINE W REFLEX MICROSCOPIC  BRAIN NATRIURETIC PEPTIDE    EKG  EKG  Interpretation  Date/Time:  Monday July 11 2017 16:38:14 EST Ventricular Rate:  93 PR Interval:  180 QRS Duration: 160 QT Interval:  430 QTC Calculation: 534 R Axis:   -32 Text Interpretation:  Sinus rhythm with Fusion complexes Possible Left atrial enlargement Left axis deviation Left bundle branch block Abnormal ECG No significant change since last tracing Confirmed by Derwood Kaplananavati, Raushanah Osmundson ([id number]) on 07/11/2017 6:00:41 PM       Radiology Dg Chest 2 View  Result Date: 07/11/2017 CLINICAL DATA:  SOB. Pt stated that she started feeling sob 3 days ago, and said it's much worse today.  She stated that she's been so sob that she couldn't make it alone all the way down the hallway at home to go to the bathroom. Pt mentioned she has felt some tightness across her chest since it started, but said it wasn't really painful just tightness. Pt also mentioned that she's on diuretics and hasn't been able to release any fluids for 2 days. She said her abdomen felt very swollen, painful, and full of fluid, and said she threw up numerous times on the way to the hospital. 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 EXAM: CHEST  2 VIEW COMPARISON:  06/25/2016 FINDINGS: Cardiac silhouette is mildly enlarged. No mediastinal or hilar masses. No convincing adenopathy. Lungs demonstrate interstitial thickening similar to the prior study. No airspace consolidation. Lungs are mildly hyperexpanded. No pleural effusion or pneumothorax. The left anterior chest wall single lead pacemaker is stable. Skeletal structures are intact. IMPRESSION: 1. Congestive heart failure reflected by mild cardiomegaly and interstitial edema. Electronically Signed   By: Amie Portlandavid  Ormond M.D.   On: 07/11/2017 18:03     Procedures Procedures (including critical care time)  Medications Ordered in ED Medications  albuterol (PROVENTIL) (2.5 MG/3ML) 0.083% nebulizer solution 5 mg (5 mg Nebulization Given 07/11/17 1647)     Initial Impression / Assessment and Plan / ED Course  I have reviewed the triage vital signs and the nursing notes.  Pertinent labs & imaging results that were available during my care of the patient were reviewed by me and considered in my medical decision making (see chart for details).    Patient comes in with chief complaint of shortness of breath. Patient has history of CHF, and clinically it seems like she has orthopnea, paroxysmal nocturnal dyspnea, exertional dyspnea secondary to worsening of the CHF.  No PE risk factors.  No chest pains, and recent negative stress test.  Chest x-ray shows interstitial edema.  We will admit to medicine for diuresing.  Final Clinical Impressions(s) / ED Diagnoses   Final diagnoses:  Acute systolic congestive heart failure [facility](HCC)    ED Discharge Orders    None       Derwood KaplanNanavati, Deven Furia, MD 07/11/17 1857

## 2017-07-11 NOTE — ED Notes (Signed)
Pt's bladder scanned, 20 ml found. MD aware

## 2017-07-11 NOTE — ED Triage Notes (Signed)
Pt to ER for evaluation of shortness of breath and productive cough x3 days with fatigue and generalized weakness. VSS. Pt dyspneic in triage. States pneumonia last year.

## 2017-07-11 NOTE — H&P (Signed)
History and Physical    Crystal SpellerLucille B Duran UJW:[id number] DOB: 21-Apr-1946 DOA: 07/11/2017  PCP:  Can't wait Consultants:  Crystal Duran - Duran Patient coming from:  Home - lives with husband; NOK: Husband, [phone]  Chief Complaint: SOB  HPI: Crystal Duran is a 71 y.o. female with medical history significant of systolic heart failure (EF not available, but very recent Echo in Dr. Annitta JerseyHarwani's office); DM; AICD-defibrillator placement; hypothyroidism; HTN; and HLD presenting with SOB, "couldn't hardly breathe. Hurting in my stomach, some tightness in the chest but it went away".  Symptoms have been present for over a week. +LE edema.  +cough, productive of clear phlegm that gags her.  Weakness, difficulty getting to the bathroom by herself.  She gets out of breath with exertion.   ED Course: h/o CHF, presenting with SOB, orthopnea, PND, exertional dyspnea secondary to CHF.  CXR with interstitial edema.  Review of Systems: As per HPI; otherwise review of systems reviewed and negative.   Ambulatory Status:  Ambulates without assistance or occasionally uses a cane  Past Medical History:  Diagnosis Date  . Cardiac arrest - ventricular fibrillation    aborted- 2011  . History of tobacco abuse   . HTN (hypertension)   . Hypercholesteremia   . Hypothyroidism   . 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 Jude Fortify ICD 123-40Q  . Ischemic cardiomyopathy    severe  . MI (myocardial infarction) (HCC)    Hx of inferior wall MI   . Mild renal insufficiency   . Systolic heart failure    compensated    Past Surgical History:  Procedure Laterality Date  . defibrillator implantation     single chamber. St Jude Fortify ICD [id number]. Remote- no.   . unspecified area hysterectomy      Social History   Socioeconomic History  . Marital status: Married    Spouse name: Not on file  . Number of children: Not on file  . Years of education:  Not on file  . Highest education level: Not on file  Social Needs  . Financial resource strain: Not on file  . Food insecurity - worry: Not on file  . Food insecurity - inability: Not on file  . Transportation needs - medical: Not on file  . Transportation needs - non-medical: Not on file  Occupational History  . Occupation: retired  Tobacco Use  . Smoking status: Former Smoker    Packs/day: 0.50    Years: 5.00    Pack years: 2.50    Last attempt to quit: 1995    Years since quitting: 23.8  . Smokeless tobacco: Never Used  Substance and Sexual Activity  . Alcohol use: Yes    Comment: occ  . Drug use: No  . Sexual activity: Not on file  Other Topics Concern  . Not on file  Social History Narrative   Mar[place], [place], retired Runner, broadcasting/film/videoteacher.      No Known Allergies  Family History  Problem Relation Age of Onset  . Coronary artery disease Other        postive family Hx    Prior to Admission medications   Medication Sig Start Date End Date Taking? Authorizing Provider  aspirin 81 MG tablet Take 81 mg by mouth daily.      [provider]  carvedilol (COREG) 25 MG tablet Take 0.5 tablets (12.5 mg total) by mouth 2 (two) times daily. 06/29/16   Crystal CloudHarwani, Mohan, MD  furosemide (  LASIX) 40 MG tablet Take 40 mg by mouth 2 (two) times daily.      [provider]  levothyroxine (SYNTHROID, LEVOTHROID) 150 MCG tablet Take 150 mcg by mouth daily before breakfast.    [provider]  nitroGLYCERIN (NITROSTAT) 0.4 MG SL tablet Place 1 tablet (0.4 mg total) under the tongue every 5 (five) minutes x 3 doses as needed for chest pain. 06/29/16   Crystal Cloud, MD  ramipril (ALTACE) 10 MG capsule Take 10 mg by mouth daily.     [provider]  rosuvastatin (CRESTOR) 20 MG tablet Take 20 mg by mouth daily.      [provider]  spironolactone (ALDACTONE) 25 MG tablet Take 1 tablet (25 mg total) by mouth daily. 06/29/16   Crystal Cloud, MD     Physical Exam: Vitals:   07/11/17 2045 07/11/17 2115 07/11/17 2134 07/11/17 2207  BP: (!) 145/92 136/86  132/79  Pulse:    93  Resp: (!) 26 (!) 30  20  Temp:    98.6 F (37 C)  TempSrc:    Oral  SpO2:   100% 95%  Weight:    64 kg (141 lb 1.6 oz)  Height:    5' 4.5" (1.638 m)     General:  Appears calm and comfortable and is NAD Eyes:  PERRL, EOMI, normal lids, iris ENT:  grossly normal hearing, lips & tongue, mmm Neck:  no LAD, masses or thyromegaly; no carotid bruits Cardiovascular:  RRR, no m/r/g. 1+ LE edema.  Respiratory:   CTA bilaterally with no wheezes/rales/rhonchi.  Increased respiratory effort. Abdomen:  soft, NT, ND, NABS Back:   normal alignment, no CVAT Skin:  no rash or induration seen on limited exam Musculoskeletal:  grossly normal tone BUE/BLE, good ROM, no bony abnormality Psychiatric:  grossly normal mood and affect, speech fluent and appropriate, AOx3 Neurologic:  CN 2-12 grossly intact, moves all extremities in coordinated fashion, sensation intact    Radiological Exams on Admission: Dg Chest 2 View  Result Date: 07/11/2017 CLINICAL DATA:  SOB. Pt stated that she started feeling sob 3 days ago, and said it's much worse today. She stated that she's been so sob that she couldn't make it alone all the way down the hallway at home to go to the bathroom. Pt mentioned she has felt some tightness across her chest since it started, but said it wasn't really painful just tightness. Pt also mentioned that she's on diuretics and hasn't been able to release any fluids for 2 days. She said her abdomen felt very swollen, painful, and full of fluid, and said she threw up numerous times on the way to the hospital. 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 EXAM: CHEST  2 VIEW COMPARISON:  06/25/2016 FINDINGS: Cardiac silhouette is mildly enlarged. No mediastinal or hilar masses. No convincing adenopathy. Lungs demonstrate interstitial thickening similar to the prior study. No airspace consolidation. Lungs are mildly hyperexpanded. No pleural effusion or pneumothorax. The left anterior chest wall single lead pacemaker is stable. Skeletal structures are intact. IMPRESSION: 1. Congestive heart failure reflected by mild cardiomegaly and interstitial edema. Electronically Signed   By: Amie Portland M.D.   On: 07/11/2017 18:03    EKG: Independently reviewed.  NSR with rate 93; LBBB; NSCSLT   Labs on Admission: I have personally reviewed the available labs and  imaging studies at the time of the admission.  Pertinent labs:   Glucose 182, 155 BNP >4500, prior 1461.3 in 10/17 WBC 6.5 A1c 6.2 TSH 0.036 UA: small Hgb, trace LE, 30 protein BUN 24/Creatinine 1.65/GFR 35; 16/1.39/43 in 10/17 Albumin 3.2   Assessment/Plan Principal Problem:   CHF exacerbation (HCC) Active Problems:   HYPERTENSION, BENIGN   Hypothyroidism   Mild protein-calorie malnutrition (HCC)   Diabetes mellitus type 2 in nonobese (HCC)   CHF exacerbation -Patient presenting with worsening SOB, orthopnea, PND -CXR consistent with pulmonary edema -Normal WBC count; will not give antibiotics at this time -Markedly elevated BNP -With elevated BNP and abnl CXR, CHF seems most probable as diagnosis -Will place in observation status with telemetry -Patient reports having had an echocardiogram in Dr. Annitta JerseyHarwani's office about 2 weeks ago so will not order at thsi time -Consult by Dr. Sharyn Duran tomorrow - request placed through [employer] message -Will continue ASA -Consider starting Lisinopril if creatinine improves -Beta blocker continued but this may need to be held if patient is not improving due to acute decompensation -CHF order set utilized -Was given Lasix 40 mg x 1  in ER and will repeat with 40 mg IV BID -Continue Aldactone -prn  O2 for now -Repeat EKG in AM -Will r/o with serial troponins although doubt ACS based on symptoms  HTN -Continue Coreg -Consider addition of ACE if renal function will allow  Hypothyroidism -TSH mildly depressed -Will check free T4  -Continue Synthroid at current dose for now but a dose adjustment may be indicated  DM -A1c 6.2 -hold Glucophage -Cover with moderate-scale SSI  Mild malnutrition -Consider nutrition consult  DVT prophylaxis:  Lovenox  Code Status:  Full - confirmed with patient/family Family Communication: Husband present throughout evaluation Disposition Plan:  Home once clinically improved Consults called: Duran; CM/PT/OT  Admission status: Admit - It is my clinical opinion that admission to INPATIENT is reasonable and necessary because this patient will require at least 2 midnights in the hospital to treat this condition based on the medical complexity of the problems presented.  Given the aforementioned information, the predictability of an adverse outcome is felt to be significant.    Jonah BlueJennifer Gina Leblond MD [facility]  If note is complete, please contact covering daytime or nighttime physician. [email or web address] Password [email or web address]  07/12/2017, 12:37 AM

## 2017-07-12 ENCOUNTER — Encounter (HOSPITAL_COMMUNITY): Payer: Self-pay | Admitting: Internal Medicine

## 2017-07-12 DIAGNOSIS — E039 Hypothyroidism, unspecified: Secondary | ICD-10-CM | POA: Diagnosis present

## 2017-07-12 DIAGNOSIS — E119 Type 2 diabetes mellitus without complications: Secondary | ICD-10-CM

## 2017-07-12 DIAGNOSIS — I5023 Acute on chronic systolic (congestive) heart failure: Secondary | ICD-10-CM

## 2017-07-12 DIAGNOSIS — R739 Hyperglycemia, unspecified: Secondary | ICD-10-CM | POA: Diagnosis present

## 2017-07-12 DIAGNOSIS — E441 Mild protein-calorie malnutrition: Secondary | ICD-10-CM | POA: Diagnosis present

## 2017-07-12 LAB — GLUCOSE, CAPILLARY
GLUCOSE-CAPILLARY: 125 mg/dL — AB (ref 65–99)
GLUCOSE-CAPILLARY: 105 mg/dL — AB (ref 65–99)
GLUCOSE-CAPILLARY: 107 mg/dL — AB (ref 65–99)

## 2017-07-12 LAB — BASIC METABOLIC PANEL
Anion gap: 13 (ref 5–15)
BUN: 27 mg/dL — ABNORMAL HIGH (ref 6–20)
CHLORIDE: 105 mmol/L (ref 101–111)
CO2: 22 mmol/L (ref 22–32)
CREATININE: 1.7 mg/dL — AB (ref 0.44–1.00)
Calcium: 8.7 mg/dL — ABNORMAL LOW (ref 8.9–10.3)
GFR calc non Af Amer: 29 mL/min — ABNORMAL LOW (ref >60)
GFR, EST AFRICAN AMERICAN: 34 mL/min — AB (ref >60)
Glucose, Bld: 120 mg/dL — ABNORMAL HIGH (ref 65–99)
Potassium: 3.5 mmol/L (ref 3.5–5.1)
Sodium: 140 mmol/L (ref 135–145)

## 2017-07-12 LAB — TROPONIN I
TROPONIN I: 0.04 ng/mL — AB (ref <0.03)
Troponin I: 0.04 ng/mL (ref <0.03)
Troponin I: 0.04 ng/mL (ref <0.03)

## 2017-07-12 LAB — URIC ACID: URIC ACID, SERUM: 13.6 mg/dL — AB (ref 2.3–6.6)

## 2017-07-12 LAB — T4, FREE: Free T4: 1.97 ng/dL — ABNORMAL HIGH (ref 0.61–1.12)

## 2017-07-12 MED ORDER — LEVOTHYROXINE SODIUM 75 MCG PO TABS
150.0000 ug | ORAL_TABLET | Freq: Every day | ORAL | Status: DC
  Administered 2017-07-13 – 2017-07-14 (×2): 150 ug via ORAL
  Filled 2017-07-12 (×2): qty 2

## 2017-07-12 MED ORDER — POTASSIUM CHLORIDE CRYS ER 20 MEQ PO TBCR
40.0000 meq | EXTENDED_RELEASE_TABLET | Freq: Two times a day (BID) | ORAL | Status: DC
  Administered 2017-07-12 – 2017-07-13 (×4): 40 meq via ORAL
  Filled 2017-07-12 (×4): qty 2

## 2017-07-12 MED ORDER — ALBUMIN HUMAN 25 % IV SOLN
12.5000 g | Freq: Once | INTRAVENOUS | Status: AC
  Administered 2017-07-12: 12.5 g via INTRAVENOUS
  Filled 2017-07-12: qty 50

## 2017-07-12 MED ORDER — ALLOPURINOL 100 MG PO TABS
100.0000 mg | ORAL_TABLET | Freq: Every day | ORAL | Status: DC
  Administered 2017-07-12 – 2017-07-15 (×4): 100 mg via ORAL
  Filled 2017-07-12 (×4): qty 1

## 2017-07-12 MED ORDER — CIPROFLOXACIN HCL 500 MG PO TABS
250.0000 mg | ORAL_TABLET | Freq: Two times a day (BID) | ORAL | Status: DC
  Administered 2017-07-12 – 2017-07-13 (×2): 250 mg via ORAL
  Filled 2017-07-12 (×2): qty 1

## 2017-07-12 MED ORDER — LOSARTAN POTASSIUM 25 MG PO TABS: 25.0000 mg | ORAL_TABLET | Freq: Every day | ORAL | Status: DC

## 2017-07-12 NOTE — Progress Notes (Signed)
Pt resting quietly, vitals stable, no any complain of chest pain and SOB, will continue to monitor the patient

## 2017-07-12 NOTE — Progress Notes (Signed)
CRITICAL VALUE ALERT  Critical Value: troponin 0.04  Date & Time Notied:  07/12/2017  Provider Notified: Family medicine  Orders Received/Actions taken: paged and waiting response

## 2017-07-12 NOTE — Progress Notes (Signed)
New pt admission from ED. Pt brought to the floor in stable condition. Vitals taken. Initial Assessment done. All immediate pertinent needs to patient addressed. Patient Guide given to patient. Important safety instructions relating to hospitalization reviewed with patient. Patient verbalized understanding. Will continue to monitor pt. 

## 2017-07-12 NOTE — Progress Notes (Signed)
PROGRESS NOTE    Crystal Duran  RUE:[id number] DOB: [birth year] DOA: 07/11/2017 PCP: Rinaldo CloudHarwani, Mohan, MD   Brief Narrative: 71 y.o. female with medical history significant of systolic heart failure (EF not available, but very recent Echo in Dr. Annitta JerseyHarwani's office); DM; AICD-defibrillator placement; hypothyroidism; HTN; and HLD presenting with SOB, "couldn't hardly breathe. Hurting in my stomach, some tightness in the chest but it went away".  Symptoms have been present for over a week. +LE edema.  +cough, productive of clear phlegm that gags her.  Weakness, difficulty getting to the bathroom by herself.  She gets out of breath with exertion.   ED Course: h/o CHF, presenting with SOB, orthopnea, PND, exertional dyspnea secondary to CHF.  CXR with interstitial edema.      Assessment & Plan:   Principal Problem:   CHF exacerbation (HCC) Active Problems:   HYPERTENSION, BENIGN   Hypothyroidism   Mild protein-calorie malnutrition (HCC)   Diabetes mellitus type 2 in nonobese (HCC)  ACUTE ON CHRONIC CHF- ECHO done at dr Katrinka Blazingharwanis office.patient admitted with doe,sob,bnp more than 4500,cxr chf.on lasix,bb and aldactone.ace on hold due to elevating creatinine.  HTN- CONTINUE ABOVE MEDS.  HYPOTHYROIDSM-CONTINUE SYNTHROID.RECHECK LEVELS IN 6 WEEKS.    DVT prophylaxis: Lovenox Code Status: Full code Family Communication: None Disposition Plan: TBD Consultants: Cardiology pending  Procedures: None Antimicrobials: None Subjective: Patient reports that she is feeling better after diuresing.  She does report that she has gained over 10 pounds than her dry weight while she was at home.   Objective: Sitting up in her bed in no acute distress Vitals:   07/12/17 0150 07/12/17 0526 07/12/17 0847 07/12/17 1148  BP: 110/60 119/72 110/73 98/65  Pulse: 82 87 90 86  Resp: 18 18    Temp:  98.8 F (37.1 C)    TempSrc:  Oral    SpO2:  97%  100%  Weight:  64 kg (141 lb)    Height:         Intake/Output Summary (Last 24 hours) at 07/12/2017 1211 Last data filed at 07/12/2017 0851 Gross per 24 hour  Intake 3 ml  Output 400 ml  Net -397 ml   Filed Weights   07/11/17 2207 07/12/17 0526  Weight: 64 kg (141 lb 1.6 oz) 64 kg (141 lb)    Examination:  General exam: Appears calm and comfortable  Respiratory system:CRACKLES AT THE BASESRespiratory effort normal. Cardiovascular system: S1 & S2 heard, RRR. No JVD, murmurs, rubs, gallops or clicks. No pedal edema. Gastrointestinal system: Abdomen is nondistended, soft and nontender. No organomegaly or masses felt. Normal bowel sounds heard. Central nervous system: Alert and oriented. No focal neurological deficits. Extremities: Symmetric 5 x 5 power. Skin: No rashes, lesions or ulcers Psychiatry: Judgement and insight appear normal. Mood & affect appropriate.     Data Reviewed: I have personally reviewed following labs and imaging studies  CBC: Recent Labs  Lab 07/11/17 1648 07/11/17 2200  WBC 4.6 6.5  NEUTROABS 3.0 4.7  HGB 13.2 13.3  HCT 39.2 39.7  MCV 91.0 90.2  PLT 174 179   Basic Metabolic Panel: Recent Labs  Lab 07/11/17 1648 07/12/17 0634  NA 141 140  K 3.3* 3.5  CL 106 105  CO2 22 22  GLUCOSE 182* 120*  BUN 24* 27*  CREATININE 1.65* 1.70*  CALCIUM 8.6* 8.7*   GFR: Estimated Creatinine Clearance: 26.4 mL/min (A) (by C-G formula based on SCr of 1.7 mg/dL (H)). Liver Function Tests: Recent Labs  Lab  07/11/17 1648  AST 19  ALT 15  ALKPHOS 72  BILITOT 1.2  PROT 6.6  ALBUMIN 3.2*   No results for input(s): LIPASE, AMYLASE in the last 168 hours. No results for input(s): AMMONIA in the last 168 hours. Coagulation Profile: No results for input(s): INR, PROTIME in the last 168 hours. Cardiac Enzymes: Recent Labs  Lab 07/12/17 0050 07/12/17 0634  TROPONINI 0.04* 0.04*   BNP (last 3 results) No results for input(s): PROBNP in the last 8760 hours. HbA1C: Recent Labs     07/11/17 2200  HGBA1C 6.2*   CBG: Recent Labs  Lab 07/11/17 2215 07/12/17 0733 07/12/17 1146  GLUCAP 155* 125* 105*   Lipid Profile: No results for input(s): CHOL, HDL, LDLCALC, TRIG, CHOLHDL, LDLDIRECT in the last 72 hours. Thyroid Function Tests: Recent Labs    07/11/17 2200 07/12/17 0050  TSH 0.036*  --   FREET4  --  1.97*   Anemia Panel: No results for input(s): VITAMINB12, FOLATE, FERRITIN, TIBC, IRON, RETICCTPCT in the last 72 hours. Sepsis Labs: No results for input(s): PROCALCITON, LATICACIDVEN in the last 168 hours.  No results found for this or any previous visit (from the past 240 hour(s)).       Radiology Studies: Dg Chest 2 View  Result Date: 07/11/2017 CLINICAL DATA:  SOB. Pt stated that she started feeling sob 3 days ago, and said it's much worse today. She stated that she's been so sob that she couldn't make it alone all the way down the hallway at home to go to the bathroom. Pt mentioned she has felt some tightness across her chest since it started, but said it wasn't really painful just tightness. Pt also mentioned that she's on diuretics and hasn't been able to release any fluids for 2 days. She said her abdomen felt very swollen, painful, and full of fluid, and said she threw up numerous times on the way to the hospital. 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 EXAM: CHEST  2 VIEW COMPARISON:  06/25/2016 FINDINGS: Cardiac silhouette is mildly enlarged. No mediastinal or hilar masses. No convincing adenopathy. Lungs demonstrate interstitial thickening similar to the prior study. No airspace consolidation. Lungs are mildly hyperexpanded. No pleural effusion or pneumothorax. The left anterior chest wall single lead pacemaker is stable.  Skeletal structures are intact. IMPRESSION: 1. Congestive heart failure reflected by mild cardiomegaly and interstitial edema. Electronically Signed   By: Amie Portlandavid  Ormond M.D.   On: 07/11/2017 18:03        Scheduled Meds: . aspirin  81 mg Oral Daily  . carvedilol  12.5 mg Oral BID  . enoxaparin (LOVENOX) injection  30 mg Subcutaneous Q24H  . furosemide  40 mg Intravenous Q12H  . Influenza vac split quadrivalent PF  0.5 mL Intramuscular Tomorrow-1000  . insulin aspart  0-15 Units Subcutaneous TID WC  . insulin aspart  0-5 Units Subcutaneous QHS  . levothyroxine  175 mcg Oral QAC breakfast  . potassium chloride  40 mEq Oral BID  . sodium chloride flush  3 mL Intravenous Q12H  . spironolactone  25 mg Oral Daily   Continuous Infusions: . sodium chloride       LOS: 1 day     Alwyn RenElizabeth G Mathews, MD [facility]   If 7PM-7AM, please contact night-coverage [email or web address] Password [email or web address] 07/12/2017, 12:11 PM

## 2017-07-12 NOTE — Consult Note (Signed)
Referring Physician:  [name] is an 71 y.o. female.                       Chief Complaint: Shortness of breath and chest pain  HPI: 71 year old female with PMH of CAD, RCA stent, dilated cardiomyopathy, S/P ICD for cardiac arrest in past, hypertension, hyperlipidemia, Chronic systolic left heart failure is admitted for acute on chronic systolic left heart failure. Her BNP is markedly elevated and chest x-ray is positive for pulmonary edema. Her husband claims patient had stress test recently and has gone down since the test. Patient denies any chest pain now.     Past Medical History:  Diagnosis Date  . Cardiac arrest - ventricular fibrillation    aborted- 2011  . History of tobacco abuse   . HTN (hypertension)   . Hypercholesteremia   . Hypothyroidism   . 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 Fortify ICD 123-40Q  . Ischemic cardiomyopathy    severe  . MI (myocardial infarction) ([place])    Hx of inferior wall MI   . Mild renal insufficiency   . Systolic heart failure    compensated  . Type 2 diabetes mellitus ([place])       Past Surgical History:  Procedure Laterality Date  . defibrillator implantation     single chamber. Winkler ICD 401-02V. Remote- no.   . unspecified area hysterectomy      Family History  Problem Relation Age of Onset  . Coronary artery disease Other        postive family Hx   Social History:  reports that she quit smoking about 23 years ago. She has a 2.50 pack-year smoking history. she has never used smokeless tobacco. She reports that she drinks alcohol. She reports that she does not use drugs.  Allergies: No Known Allergies  Medications Prior to Admission  Medication Sig Dispense Refill  . aspirin 81 MG tablet Take 81 mg by mouth daily.      . carvedilol (COREG) 25 MG tablet Take 0.5 tablets (12.5 mg total) by mouth 2 (two) times daily. 60 tablet 3  . furosemide (LASIX) 40 MG  tablet Take 40 mg by mouth 2 (two) times daily.      [name] levothyroxine (SYNTHROID, LEVOTHROID) 175 MCG tablet Take 175 mcg daily by mouth.  3  . metFORMIN (GLUCOPHAGE-XR) 500 MG 24 hr tablet Take 500 mg daily with lunch by mouth.  2  . nitroGLYCERIN (NITROSTAT) 0.4 MG SL tablet Place 1 tablet (0.4 mg total) under the tongue every 5 (five) minutes x 3 doses as needed for chest pain. 25 tablet 12  . spironolactone (ALDACTONE) 25 MG tablet Take 1 tablet (25 mg total) by mouth daily. 30 tablet 3    Results for orders placed or performed during the hospital encounter of 07/11/17 (from the past 48 hour(s))  Comprehensive metabolic panel     Status: Abnormal   Collection Time: 07/11/17  4:48 PM  Result Value Ref Range   Sodium 141 135 - 145 mmol/L   Potassium 3.3 (L) 3.5 - 5.1 mmol/L   Chloride 106 101 - 111 mmol/L   CO2 22 22 - 32 mmol/L   Glucose, Bld 182 (H) 65 - 99 mg/dL   BUN 24 (H) 6 - 20 mg/dL   Creatinine, Ser 1.65 (H) 0.44 - 1.00 mg/dL   Calcium 8.6 (L) 8.9 - 10.3 mg/dL   Total  Protein 6.6 6.5 - 8.1 g/dL   Albumin 3.2 (L) 3.5 - 5.0 g/dL   AST 19 15 - 41 U/L   ALT 15 14 - 54 U/L   Alkaline Phosphatase 72 38 - 126 U/L   Total Bilirubin 1.2 0.3 - 1.2 mg/dL   GFR calc non Af Amer 30 (L) >60 mL/min   GFR calc Af Amer 35 (L) >60 mL/min    Comment: (NOTE) The eGFR has been calculated using the CKD EPI equation. This calculation has not been validated in all clinical situations. eGFR's persistently <60 mL/min signify possible Chronic Kidney Disease.    Anion gap 13 5 - 15  CBC with Differential     Status: Abnormal   Collection Time: 07/11/17  4:48 PM  Result Value Ref Range   WBC 4.6 4.0 - 10.5 K/uL   RBC 4.31 3.87 - 5.11 MIL/uL   Hemoglobin 13.2 12.0 - 15.0 g/dL   HCT 39.2 36.0 - 46.0 %   MCV 91.0 78.0 - 100.0 fL   MCH 30.6 26.0 - 34.0 pg   MCHC 33.7 30.0 - 36.0 g/dL   RDW 16.0 (H) 11.5 - 15.5 %   Platelets 174 150 - 400 K/uL   Neutrophils Relative % 65 %   Neutro Abs 3.0 1.7  - 7.7 K/uL   Lymphocytes Relative 27 %   Lymphs Abs 1.3 0.7 - 4.0 K/uL   Monocytes Relative 6 %   Monocytes Absolute 0.3 0.1 - 1.0 K/uL   Eosinophils Relative 1 %   Eosinophils Absolute 0.1 0.0 - 0.7 K/uL   Basophils Relative 1 %   Basophils Absolute 0.0 0.0 - 0.1 K/uL  Urinalysis, Routine w reflex microscopic     Status: Abnormal   Collection Time: 07/11/17  6:30 PM  Result Value Ref Range   Color, Urine YELLOW YELLOW   APPearance HAZY (A) CLEAR   Specific Gravity, Urine 1.011 1.005 - 1.030   pH 5.0 5.0 - 8.0   Glucose, UA NEGATIVE NEGATIVE mg/dL   Hgb urine dipstick SMALL (A) NEGATIVE   Bilirubin Urine NEGATIVE NEGATIVE   Ketones, ur NEGATIVE NEGATIVE mg/dL   Protein, ur 30 (A) NEGATIVE mg/dL   Nitrite NEGATIVE NEGATIVE   Leukocytes, UA TRACE (A) NEGATIVE   RBC / HPF 0-5 0 - 5 RBC/hpf   WBC, UA 6-30 0 - 5 WBC/hpf   Bacteria, UA RARE (A) NONE SEEN   Squamous Epithelial / LPF 0-5 (A) NONE SEEN   Hyaline Casts, UA PRESENT   Brain natriuretic peptide     Status: Abnormal   Collection Time: 07/11/17  6:38 PM  Result Value Ref Range   B Natriuretic Peptide >4,500.0 (H) 0.0 - 100.0 pg/mL  CBC WITH DIFFERENTIAL     Status: Abnormal   Collection Time: 07/11/17 10:00 PM  Result Value Ref Range   WBC 6.5 4.0 - 10.5 K/uL   RBC 4.40 3.87 - 5.11 MIL/uL   Hemoglobin 13.3 12.0 - 15.0 g/dL   HCT 39.7 36.0 - 46.0 %   MCV 90.2 78.0 - 100.0 fL   MCH 30.2 26.0 - 34.0 pg   MCHC 33.5 30.0 - 36.0 g/dL   RDW 15.9 (H) 11.5 - 15.5 %   Platelets 179 150 - 400 K/uL   Neutrophils Relative % 71 %   Neutro Abs 4.7 1.7 - 7.7 K/uL   Lymphocytes Relative 20 %   Lymphs Abs 1.3 0.7 - 4.0 K/uL   Monocytes Relative 7 %   Monocytes  Absolute 0.5 0.1 - 1.0 K/uL   Eosinophils Relative 1 %   Eosinophils Absolute 0.0 0.0 - 0.7 K/uL   Basophils Relative 1 %   Basophils Absolute 0.0 0.0 - 0.1 K/uL  Hemoglobin A1c     Status: Abnormal   Collection Time: 07/11/17 10:00 PM  Result Value Ref Range   Hgb A1c  MFr Bld 6.2 (H) 4.8 - 5.6 %    Comment: (NOTE) Pre diabetes:          5.7%-6.4% Diabetes:              >6.4% Glycemic control for   <7.0% adults with diabetes    Mean Plasma Glucose 131.24 mg/dL  TSH     Status: Abnormal   Collection Time: 07/11/17 10:00 PM  Result Value Ref Range   TSH 0.036 (L) 0.350 - 4.500 uIU/mL    Comment: Performed by a 3rd Generation assay with a functional sensitivity of <=0.01 uIU/mL.  Glucose, capillary     Status: Abnormal   Collection Time: 07/11/17 10:15 PM  Result Value Ref Range   Glucose-Capillary 155 (H) 65 - 99 mg/dL   Comment 1 Notify RN    Comment 2 Document in Chart   T4, free     Status: Abnormal   Collection Time: 07/12/17 12:50 AM  Result Value Ref Range   Free T4 1.97 (H) 0.61 - 1.12 ng/dL    Comment: (NOTE) Biotin ingestion may interfere with free T4 tests. If the results are inconsistent with the TSH level, previous test results, or the clinical presentation, then consider biotin interference. If needed, order repeat testing after stopping biotin.   Troponin I (q 6hr x 3)     Status: Abnormal   Collection Time: 07/12/17 12:50 AM  Result Value Ref Range   Troponin I 0.04 (HH) <0.03 ng/mL    Comment: CRITICAL RESULT CALLED TO, READ BACK BY AND VERIFIED WITH: ARYAL R,RN 07/12/17 0145 WAYK   Basic metabolic panel     Status: Abnormal   Collection Time: 07/12/17  6:34 AM  Result Value Ref Range   Sodium 140 135 - 145 mmol/L   Potassium 3.5 3.5 - 5.1 mmol/L   Chloride 105 101 - 111 mmol/L   CO2 22 22 - 32 mmol/L   Glucose, Bld 120 (H) 65 - 99 mg/dL   BUN 27 (H) 6 - 20 mg/dL   Creatinine, Ser 1.70 (H) 0.44 - 1.00 mg/dL   Calcium 8.7 (L) 8.9 - 10.3 mg/dL   GFR calc non Af Amer 29 (L) >60 mL/min   GFR calc Af Amer 34 (L) >60 mL/min    Comment: (NOTE) The eGFR has been calculated using the CKD EPI equation. This calculation has not been validated in all clinical situations. eGFR's persistently <60 mL/min signify possible Chronic  Kidney Disease.    Anion gap 13 5 - 15  Troponin I (q 6hr x 3)     Status: Abnormal   Collection Time: 07/12/17  6:34 AM  Result Value Ref Range   Troponin I 0.04 (HH) <0.03 ng/mL    Comment: CRITICAL VALUE NOTED.  VALUE IS CONSISTENT WITH PREVIOUSLY REPORTED AND CALLED VALUE.  Glucose, capillary     Status: Abnormal   Collection Time: 07/12/17  7:33 AM  Result Value Ref Range   Glucose-Capillary 125 (H) 65 - 99 mg/dL  Troponin I (q 6hr x 3)     Status: Abnormal   Collection Time: 07/12/17 11:17 AM  Result Value Ref  Range   Troponin I 0.04 (HH) <0.03 ng/mL    Comment: CRITICAL VALUE NOTED.  VALUE IS CONSISTENT WITH PREVIOUSLY REPORTED AND CALLED VALUE.  Glucose, capillary     Status: Abnormal   Collection Time: 07/12/17 11:46 AM  Result Value Ref Range   Glucose-Capillary 105 (H) 65 - 99 mg/dL  Glucose, capillary     Status: Abnormal   Collection Time: 07/12/17  4:37 PM  Result Value Ref Range   Glucose-Capillary 107 (H) 65 - 99 mg/dL   Dg Chest 2 View  Result Date: 07/11/2017 CLINICAL DATA:  SOB. Pt stated that she started feeling sob 3 days ago, and said it's much worse today. She stated that she's been so sob that she couldn't make it alone all the way down the hallway at home to go to the bathroom. Pt mentioned she has felt some tightness across her chest since it started, but said it wasn't really painful just tightness. Pt also mentioned that she's on diuretics and hasn't been able to release any fluids for 2 days. She said her abdomen felt very swollen, painful, and full of fluid, and said she threw up numerous times on the way to the hospital. 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 EXAM: CHEST  2 VIEW COMPARISON:  06/25/2016 FINDINGS: Cardiac  silhouette is mildly enlarged. No mediastinal or hilar masses. No convincing adenopathy. Lungs demonstrate interstitial thickening similar to the prior study. No airspace consolidation. Lungs are mildly hyperexpanded. No pleural effusion or pneumothorax. The left anterior chest wall single lead pacemaker is stable. Skeletal structures are intact. IMPRESSION: 1. Congestive heart failure reflected by mild cardiomegaly and interstitial edema. Electronically Signed   By: Lajean Manes M.D.   On: 07/11/2017 18:03    Review Of Systems Constitutional: No fever, chills, weight loss or gain. Eyes: No vision change, wears glasses. No discharge or pain. Ears: No hearing loss, No tinnitus. Respiratory: Positive asthma, COPD, pneumonias. Positive shortness of breath. No hemoptysis. Cardiovascular: Positive chest pain, palpitation, leg edema. Gastrointestinal: No nausea, vomiting, diarrhea, constipation. No GI bleed. No hepatitis. Genitourinary: No dysuria, hematuria, kidney stone. No incontinance. Neurological: No headache, stroke, seizures.  Psychiatry: No psych facility admission for anxiety, depression, suicide. No detox. Skin: No rash. Musculoskeletal: Positive joint pain, no fibromyalgia. No neck pain, back pain. Lymphadenopathy: No lymphadenopathy. Hematology: No anemia or easy bruising.   Blood pressure 122/69, pulse 94, temperature 98.8 F (37.1 C), temperature source Oral, resp. rate 18, height 5' 4.5" (1.638 m), weight 64 kg (141 lb), SpO2 100 %. Body mass index is 23.83 kg/m. General appearance: alert, cooperative, appears stated age and no distress at rest. Head: Normocephalic, atraumatic. Eyes: Brown eyes, pink conjunctiva, corneas clear. PERRL, EOM's intact. Neck: No adenopathy, no carotid bruit, no JVD, supple, symmetrical, trachea midline and thyroid not enlarged. Resp: Clearing to auscultation bilaterally. Cardio: Regular rate and rhythm, S1, S2 normal, II/VI systolic murmur, no click,  rub or gallop GI: Soft, non-tender; bowel sounds normal; no organomegaly. Extremities: Trace edema, no cyanosis or clubbing. Skin: Warm and dry.  Neurologic: Alert and oriented X 3, normal strength. Normal coordination.  Assessment/Plan Acute on chronic systolic left heart failure Chest pain Dilated cardiomyopathy Ischemic cardiomyopathy CAD S/P RCA stent Hypertension Hypothyroidism Iatrogenic hyperthyroidism Type 2 DM H/O tobacco use disorder Bi-V ICD placement for cardiac arrest and low EF. Possible UTI CKD,  III  Continue diuresis. Check uric acid level and start small dose allopurinol with increasing diuresis and creatinine. IV albumin x 1 to augment diuresis in patient with hypoalbuminemia. Echocardiogram for LV function. Consider small dose Lanoxin if RV failure is evident. Decrease Levothyroxine dose by 25 mcg and further post recheck T4 and TSH in 1 month. Not able to use ACE inhibitor or ARB due to increasing creatinine.  Birdie Riddle, MD  07/12/2017, 5:38 PM

## 2017-07-12 NOTE — Evaluation (Signed)
Occupational Therapy Evaluation Patient Details Name: Crystal Duran MRN: [id number] DOB: 1945-10-12 Today's Date: 07/12/2017    History of Present Illness Pt is a 71 y.o. female with PMH significant for systolic HF, DM, AICD-defibrillator placement, HTN, HLD, who presents on 07/11/17 with SOB, chest tightness and productive cough; CXR consistent with pulmonary edema. Being worked up for CHF exacerbation.    Clinical Impression   PTA, pt was independent with occasional use of cane for functional mobility and ADL participation. She currently requires overall supervision for safety with standing ADL and ADL transfers. She presents with significantly diminished activity tolerance for ADL requiring 3 seated rest breaks during a 5 minute period of participating in standing grooming tasks. Pt would benefit from continued OT services while admitted to improve independence with and tolerance for ADL participation prior to D/C home. Do not anticipate need for further OT services post-acute D/C.    Follow Up Recommendations  No OT follow up;Supervision - Intermittent    Equipment Recommendations  None recommended by OT    Recommendations for Other Services       Precautions / Restrictions Precautions Precautions: Fall Restrictions Weight Bearing Restrictions: No      Mobility Bed Mobility Overal bed mobility: Independent                Transfers Overall transfer level: Needs assistance Equipment used: Rolling walker (2 wheeled) Transfers: Sit to/from Stand Sit to Stand: Supervision         General transfer comment: Supervision for safety with RW. Good carry over of hand placement.     Balance Overall balance assessment: Needs assistance Sitting-balance support: No upper extremity supported;Feet supported Sitting balance-Leahy Scale: Good     Standing balance support: No upper extremity supported;Bilateral upper extremity supported;During functional activity Standing  balance-Leahy Scale: Fair Standing balance comment: Able to statically stand without UE support for grooming tasks at sink. Requires UE support for functional mobility.                            ADL either performed or assessed with clinical judgement   ADL Overall ADL's : Needs assistance/impaired Eating/Feeding: Set up;Sitting   Grooming: Supervision/safety;Standing Grooming Details (indicate cue type and reason): 3 rest breaks while brushing teeth and washing face Upper Body Bathing: Set up;Sitting   Lower Body Bathing: Supervison/ safety;Sit to/from stand   Upper Body Dressing : Set up;Sitting   Lower Body Dressing: Supervision/safety;Sit to/from stand   Toilet Transfer: Supervision/safety;Ambulation;RW Toilet Transfer Details (indicate cue type and reason): Simulated with sit<> stand followed by ambulation in room.  Toileting- Clothing Manipulation and Hygiene: Supervision/safety;Sit to/from stand       Functional mobility during ADLs: Supervision/safety;Rolling walker General ADL Comments: Pt requires overall supervision for safety with standing ADL secondary to fatigue. Pt requiring multiple seated rest breaks during standing ADL tasks at sink.      Vision Patient Visual Report: No change from baseline Vision Assessment?: No apparent visual deficits     Perception     Praxis      Pertinent Vitals/Pain Pain Assessment: No/denies pain     Hand Dominance     Extremity/Trunk Assessment Upper Extremity Assessment Upper Extremity Assessment: Overall WFL for tasks assessed   Lower Extremity Assessment Lower Extremity Assessment: Generalized weakness       Communication Communication Communication: No difficulties   Cognition Arousal/Alertness: Awake/alert Behavior During Therapy: WFL for tasks assessed/performed Overall Cognitive Status: Within Functional  Limits for tasks assessed                                     General  Comments  Husband present at beginning of session.     Exercises     Shoulder Instructions      Home Living Family/patient expects to be discharged to:: Private residence Living Arrangements: Spouse/significant other Available Help at Discharge: Family;Available 24 hours/day Type of Home: House Home Access: Stairs to enter Entergy CorporationEntrance Stairs-Number of Steps: 3 (in back)(10-15 in front) Entrance Stairs-Rails: Right Home Layout: One level     Bathroom Shower/Tub: Chief Strategy OfficerTub/shower unit   Bathroom Toilet: Standard     Home Equipment: Shower seat;Cane - single point          Prior Functioning/Environment Level of Independence: Independent        Comments: 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 Indep with intermittent use of SPC ("when I feel off or unsteady"); limited household ambulation distance secondary to WOB. Pt and husband retired from work        OT Problem List: Decreased activity tolerance;Impaired balance (sitting and/or standing);Decreased knowledge of use of DME or AE;Decreased strength;Cardiopulmonary status limiting activity      OT Treatment/Interventions: Self-care/ADL training;Therapeutic exercise;Energy conservation;DME and/or AE instruction;Therapeutic activities;Patient/family education;Balance training    OT Goals(Current goals can be found in the care plan section) Acute Rehab OT Goals Patient Stated Goal: Have more energy OT Goal Formulation: With patient Time For Goal Achievement: 07/26/17 Potential to Achieve Goals: Good ADL Goals Pt Will Perform Grooming: with modified independence;standing(2 consecutive tasks with no rest breaks) Pt Will Transfer to Toilet: with modified independence;ambulating;regular height toilet(with RW) Pt Will Perform Toileting - Clothing Manipulation and hygiene: with modified independence;sit to/from stand Pt Will Perform Tub/Shower Transfer: with modified independence;Tub transfer;shower  seat;rolling walker Additional ADL Goal #1: Pt will verbalize 3 strategies to conserve energy during daily routine.  OT Frequency: Min 2X/week   Barriers to D/C:            Co-evaluation              AM-PAC PT "6 Clicks" Daily Activity     Outcome Measure Help from another person eating meals?: A Little Help from another person taking care of personal grooming?: A Little Help from another person toileting, which includes using toliet, bedpan, or urinal?: A Little Help from another person bathing (including washing, rinsing, drying)?: A Little Help from another person to put on and taking off regular upper body clothing?: A Little Help from another person to put on and taking off regular lower body clothing?: A Little 6 Click Score: 18   End of Session Equipment Utilized During Treatment: Gait belt;Rolling walker Nurse Communication: Mobility status  Activity Tolerance: Patient tolerated treatment well Patient left: in bed;with call bell/phone within reach  OT Visit Diagnosis: Other abnormalities of gait and mobility (R26.89);Muscle weakness (generalized) (M62.81)                Time: 7425-95631103-1125 OT Time Calculation (min): 22 min Charges:  OT General Charges $OT Visit: 1 Visit OT Evaluation $OT Eval Moderate Complexity: 1 Mod G-Codes:     Doristine Sectionharity A Calyx Hawker, MS OTR/L  Pager: [phone]   Chrisie Jankovich A Serafina Topham 07/12/2017, 3:45 PM

## 2017-07-12 NOTE — Evaluation (Signed)
Physical Therapy Evaluation Patient Details Name: Crystal Duran MRN: [id number] DOB: Nov 05, 1945 Today's Date: 07/12/2017   History of Present Illness  Pt is a 71 y.o. female with PMH significant for systolic HF, DM, AICD-defibrillator placement, HTN, HLD, who presents on 07/11/17 with SOB, chest tightness and productive cough; CXR consistent with pulmonary edema. Being worked up for CHF exacerbation.     Clinical Impression  Pt presents with decreased activity tolerance, generalized weakness, and an overall decrease in functional mobility secondary to above. PTA, pt lives with husband and indep for amb short distances, intermittent use of husband's SPC for balance; uses 2L O2 [place] as needed for exertion at baseline. Today, pt able to amb with SPC and intermittent UE support, with close min guard secondary to c/o BLE fatigue; pt required 4x standing rest breaks with dyspnea 2/4. SpO2 remained >90% on 2L O2 [place]. Educ on activity pacing and deep breathing technique. Feel pt would benefit from RW based on need for BUE support; pt in agreement with this. Also discussed potential for Cardiac Rehab at d/c, pt interested in pursuing this. Pt would benefit from continued acute PT services to maximize functional mobility and independence.     Follow Up Recommendations Outpatient PT(Cardiac Rehab)    Equipment Recommendations  Rolling walker with 5" wheels    Recommendations for Other Services       Precautions / Restrictions Precautions Precautions: Fall Restrictions Weight Bearing Restrictions: No      Mobility  Bed Mobility Overal bed mobility: Independent                Transfers Overall transfer level: Needs assistance Equipment used: None;Straight cane Transfers: Sit to/from Stand Sit to Stand: Supervision         General transfer comment: Stood with no AD and again with [facility], supervision for safety  Ambulation/Gait Ambulation/Gait assistance: Min guard Ambulation  Distance (Feet): 80 Feet Assistive device: Straight cane Gait Pattern/deviations: Step-through pattern;Decreased stride length Gait velocity: Decreased Gait velocity interpretation: <1.8 ft/sec, indicative of risk for recurrent falls General Gait Details: Slow amb with SPC, requiring 4x standing rest break secondary to increased SOB and c/o BLE fatigue; SpO2 remained >90% on 2L O2 [place]. Pt with intermittent use of additional support on wall handrail.  Stairs            Wheelchair Mobility    Modified Rankin (Stroke Patients Only)       Balance Overall balance assessment: Needs assistance   Sitting balance-Leahy Scale: Good       Standing balance-Leahy Scale: Fair Standing balance comment: Can static stand with no UE support and min guard                             Pertinent Vitals/Pain Pain Assessment: No/denies pain    Home Living Family/patient expects to be discharged to:: Private residence Living Arrangements: Spouse/significant other Available Help at Discharge: Family;Available 24 hours/day Type of Home: House Home Access: Stairs to enter Entrance Stairs-Rails: Right Entrance Stairs-Number of Steps: 3 (in back)(10-15 in front) Home Layout: One level Home Equipment: Shower seat;Cane - single point      Prior Function Level of Independence: Independent         Comments: Indep with intermittent use of SPC ("when I feel off or unsteady"); limited household ambulation distance secondary to WOB. Pt and husband retired from work     Higher education careers adviserHand Dominance  Extremity/Trunk Assessment   Upper Extremity Assessment Upper Extremity Assessment: Overall WFL for tasks assessed    Lower Extremity Assessment Lower Extremity Assessment: Generalized weakness    Cervical / Trunk Assessment Cervical / Trunk Assessment: Normal  Communication   Communication: No difficulties  Cognition Arousal/Alertness: Awake/alert Behavior During Therapy: WFL for  tasks assessed/performed Overall Cognitive Status: Within Functional Limits for tasks assessed                                        General Comments General comments (skin integrity, edema, etc.): Husband present during session    Exercises     Assessment/Plan    PT Assessment Patient needs continued PT services  PT Problem List Decreased strength;Decreased activity tolerance;Decreased balance;Decreased mobility;Decreased knowledge of use of DME;Cardiopulmonary status limiting activity       PT Treatment Interventions DME instruction;Gait training;Stair training;Functional mobility training;Therapeutic activities;Therapeutic exercise;Balance training;Patient/family education    PT Goals (Current goals can be found in the Care Plan section)  Acute Rehab PT Goals Patient Stated Goal: Be able to walk further distances at a time PT Goal Formulation: With patient Time For Goal Achievement: 07/26/17 Potential to Achieve Goals: Good    Frequency Min 3X/week   Barriers to discharge        Co-evaluation               AM-PAC PT "6 Clicks" Daily Activity  Outcome Measure Difficulty turning over in bed (including adjusting bedclothes, sheets and blankets)?: None Difficulty moving from lying on back to sitting on the side of the bed? : None Difficulty sitting down on and standing up from a chair with arms (e.g., wheelchair, bedside commode, etc,.)?: A Little Help needed moving to and from a bed to chair (including a wheelchair)?: A Little Help needed walking in hospital room?: A Little Help needed climbing 3-5 steps with a railing? : A Little 6 Click Score: 20    End of Session Equipment Utilized During Treatment: Gait belt;Oxygen Activity Tolerance: Patient tolerated treatment well;Patient limited by fatigue Patient left: in chair;with call bell/phone within reach;with family/visitor present Nurse Communication: Mobility status PT Visit Diagnosis:  Unsteadiness on feet (R26.81);Muscle weakness (generalized) (M62.81)    Time: [id number] PT Time Calculation (min) (ACUTE ONLY): 28 min   Charges:   PT Evaluation $PT Eval Moderate Complexity: 1 Mod PT Treatments $Gait Training: 8-22 mins   PT G Codes:   PT G-Codes **NOT FOR INPATIENT CLASS** Functional Assessment Tool Used: AM-PAC 6 Clicks Basic Mobility Functional Limitation: Mobility: Walking and moving around Mobility: Walking and Moving Around Current Status ([id number]([id number]): At least 20 percent but less than 40 percent impaired, limited or restricted Mobility: Walking and Moving Around Goal Status [phone](G8979): At least 1 percent but less than 20 percent impaired, limited or restricted   Ina HomesJaclyn Jackob [place], PT, DPT Acute Rehab Services  Pager: [phone]  Malachy ChamberJaclyn L Percy Comp 07/12/2017, 9:19 AM

## 2017-07-13 ENCOUNTER — Inpatient Hospital Stay (HOSPITAL_COMMUNITY): Payer: Medicare PPO

## 2017-07-13 DIAGNOSIS — N189 Chronic kidney disease, unspecified: Secondary | ICD-10-CM

## 2017-07-13 DIAGNOSIS — N179 Acute kidney failure, unspecified: Secondary | ICD-10-CM | POA: Diagnosis present

## 2017-07-13 LAB — BASIC METABOLIC PANEL
ANION GAP: 10 (ref 5–15)
BUN: 29 mg/dL — ABNORMAL HIGH (ref 6–20)
CALCIUM: 8.7 mg/dL — AB (ref 8.9–10.3)
CO2: 22 mmol/L (ref 22–32)
Chloride: 107 mmol/L (ref 101–111)
Creatinine, Ser: 1.91 mg/dL — ABNORMAL HIGH (ref 0.44–1.00)
GFR, EST AFRICAN AMERICAN: 29 mL/min — AB (ref >60)
GFR, EST NON AFRICAN AMERICAN: 25 mL/min — AB (ref >60)
Glucose, Bld: 121 mg/dL — ABNORMAL HIGH (ref 65–99)
Potassium: 4.5 mmol/L (ref 3.5–5.1)
SODIUM: 139 mmol/L (ref 135–145)

## 2017-07-13 LAB — GLUCOSE, CAPILLARY
GLUCOSE-CAPILLARY: 178 mg/dL — AB (ref 65–99)
GLUCOSE-CAPILLARY: 86 mg/dL (ref 65–99)
GLUCOSE-CAPILLARY: 84 mg/dL (ref 65–99)
Glucose-Capillary: 109 mg/dL — ABNORMAL HIGH (ref 65–99)
Glucose-Capillary: 111 mg/dL — ABNORMAL HIGH (ref 65–99)

## 2017-07-13 MED ORDER — POTASSIUM CHLORIDE CRYS ER 20 MEQ PO TBCR
30.0000 meq | EXTENDED_RELEASE_TABLET | Freq: Two times a day (BID) | ORAL | Status: DC
  Administered 2017-07-13: 30 meq via ORAL
  Filled 2017-07-13: qty 1

## 2017-07-13 NOTE — Progress Notes (Signed)
PT Cancellation Note  Patient Details Name: Crystal Duran MRN: [id number] DOB: 01/17/1946   Cancelled Treatment:    Reason Eval/Treat Not Completed: (P) Pain limiting ability to participate(Declined multiple times. Pt stated pain 10/10. Stated she did not want to walk until she had pain medicine. Notified RN. Will come back as time allows.)  Sharlene Mottsachel Patterson Hollenbaugh, SPTA  Sharlene MottsRachel Kyann Heydt 07/13/2017, 2:59 PM

## 2017-07-13 NOTE — Progress Notes (Signed)
Subjective:  Patient denies any chest pain states breathing is improving slowly. Leg swelling markedly improved.  Objective:  Vital Signs in the last 24 hours: Temp:  [97.7 F (36.5 C)-98.2 F (36.8 C)] 97.7 F (36.5 C) (11/14 1218) Pulse Rate:  [78-94] 93 (11/14 1218) Resp:  [18-20] 18 (11/14 1218) BP: (106-122)/(63-72) 107/63 (11/14 1218) SpO2:  [98 %-100 %] 99 % (11/14 1218) Weight:  [64.3 kg (141 lb 12.8 oz)] 64.3 kg (141 lb 12.8 oz) (11/14 0429)  Intake/Output from previous day: 11/13 0701 - 11/14 0700 In: 293 [P.O.:240; I.V.:3; IV Piggyback:50] Out: 550 [Urine:550] Intake/Output from this shift: Total I/O In: 240 [P.O.:240] Out: 150 [Urine:150]  Physical Exam: Neck: no adenopathy, no carotid bruit, no JVD and supple, symmetrical, trachea midline Lungs: Decreased breath sound at bases with faint rales Heart: regular rate and rhythm, S1, S2 normal and 2/6 systolic murmur noted Abdomen: soft, non-tender; bowel sounds normal; no masses,  no organomegaly Extremities: extremities normal, atraumatic, no cyanosis or edema  Lab Results: Recent Labs    07/11/17 1648 07/11/17 2200  WBC 4.6 6.5  HGB 13.2 13.3  PLT 174 179   Recent Labs    07/12/17 0634 07/13/17 0503  NA 140 139  K 3.5 4.5  CL 105 107  CO2 22 22  GLUCOSE 120* 121*  BUN 27* 29*  CREATININE 1.70* 1.91*   Recent Labs    07/12/17 0634 07/12/17 1117  TROPONINI 0.04* 0.04*   Hepatic Function Panel Recent Labs    07/11/17 1648  PROT 6.6  ALBUMIN 3.2*  AST 19  ALT 15  ALKPHOS 72  BILITOT 1.2   No results for input(s): CHOL in the last 72 hours. No results for input(s): PROTIME in the last 72 hours.  Imaging: Imaging results have been reviewed and Dg Chest 2 View  Result Date: 07/11/2017 CLINICAL DATA:  SOB. Pt stated that she started feeling sob 3 days ago, and said it's much worse today. She stated that she's been so sob that she couldn't make it alone all the way down the hallway at home  to go to the bathroom. Pt mentioned she has felt some tightness across her chest since it started, but said it wasn't really painful just tightness. Pt also mentioned that she's on diuretics and hasn't been able to release any fluids for 2 days. She said her abdomen felt very swollen, painful, and full of fluid, and said she threw up numerous times on the way to the hospital. 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 EXAM: CHEST  2 VIEW COMPARISON:  06/25/2016 FINDINGS: Cardiac silhouette is mildly enlarged. No mediastinal or hilar masses. No convincing adenopathy. Lungs demonstrate interstitial thickening similar to the prior study. No airspace consolidation. Lungs are mildly hyperexpanded. No pleural effusion or pneumothorax. The left anterior chest wall single lead pacemaker is stable. Skeletal structures are intact. IMPRESSION: 1. Congestive heart failure reflected by mild cardiomegaly and interstitial edema. Electronically Signed   By: Amie Portlandavid  Ormond M.D.   On: 07/11/2017 18:03    Cardiac Studies:  Assessment/Plan:  Resolving Acute on chronic systolic left heart failure Status post atypical Chest pain Dilated cardiomyopathy Ischemic cardiomyopathy CAD S/P RCA stent Hypertension Hypothyroidism Iatrogenic hyperthyroidism Type 2 DM H/O tobacco use disorder Bi-V ICD placement for cardiac arrest and low EF. Possible UTI CKD, III Plan Continue present management Will  start BiDil as blood pressure tolerates    LOS: 2 days    Rinaldo CloudHarwani, Barth Trella 07/13/2017, 12:46 PM

## 2017-07-13 NOTE — Progress Notes (Signed)
Progress Note    Crystal Duran  ZOX:[id number] DOB: [birth year]  DOA: 07/11/2017 PCP: Rinaldo CloudHarwani, Mohan, MD    Brief Narrative:   Chief complaint: Follow-up CHF exacerbation  Medical records reviewed and are as summarized below:  Crystal Duran is an 71 y.o. female with PMH of CAD status post RCA stent, dilated cardiomyopathy, status post ICD for cardiac arrest in the past, hypertension, hyperlipidemia, and chronic systolic CHF who was admitted 81/19/1410/08/16 for a heart failure exacerbation with a markedly elevated BNP and pulmonary edema on chest x-ray.  Assessment/Plan:   Principal Problem:   Acute on chronic systolic CHF (HCC)/elevated troponin/dilated cardiomyopathy Cardiology following. Myoview done 06/10/17 reviewed. EF 14% on that study. Repeat 2-D echo ordered, results pending. No prior 2-D echo as in Epic for review. Unable to use ACE inhibitor/ARB due to increasing creatinine. Troponin mildly elevated, but trend flat. Continue Coreg, spironolactone and Lasix 40 mg IV every 12 hours.  Active Problems:   AKI/stage III CKD Baseline GFR appears to be around 43. Current GFR is lower than baseline at 29. Monitor closely with diuresis.    HYPERTENSION, BENIGN Blood pressure currently controlled.    Hypothyroidism Continue Synthroid. TSH low. Check free T4.    Diabetes mellitus type 2 in nonobese (HCC) Hemoglobin A1c 6.2%. Continue moderate scale SSI. CBGs 105-125.    Mild protein-calorie malnutrition (HCC) Body mass index is 23.96 kg/m.    *D/C Cipro, unclear why she is on this antibiotic. Urinalysis is not indicative of infection.  Family Communication/Anticipated D/C date and plan/Code Status   DVT prophylaxis: Lovenox ordered. Code Status: Full Code.  Family Communication: No family currently at bedside. Disposition Plan: Home when CHF symptoms improved.   Medical Consultants:    Cardiology   Anti-Infectives:    Cipro 07/12/17--->  07/13/17  Subjective:   Had some difficulty breathing earlier today. No complaints of chest pain. No nausea or vomiting. Still with a productive cough, light colored sputum.  Objective:    Vitals:   07/12/17 1832 07/13/17 0106 07/13/17 0429 07/13/17 0642  BP: 116/64 122/68  106/72  Pulse: 82 93  78  Resp:    20  Temp:  98.2 F (36.8 C)  98.2 F (36.8 C)  TempSrc:  Oral  Oral  SpO2: 100% 100%  98%  Weight:   64.3 kg (141 lb 12.8 oz)   Height:        Intake/Output Summary (Last 24 hours) at 07/13/2017 0842 Last data filed at 07/13/2017 0440 Gross per 24 hour  Intake 293 ml  Output 550 ml  Net -257 ml   Filed Weights   07/11/17 2207 07/12/17 0526 07/13/17 0429  Weight: 64 kg (141 lb 1.6 oz) 64 kg (141 lb) 64.3 kg (141 lb 12.8 oz)    Exam: General: No acute distress. Cardiovascular: Heart sounds show a regular rate, and rhythm. No gallops or rubs. 2/6 systolic murmur. No JVD. Lungs: Decreased at the bases with a few bibasilar rales. No rales, rhonchi or wheezes. Abdomen: Soft, nontender, nondistended with normal active bowel sounds. No masses. No hepatosplenomegaly. Neurological: Alert and oriented 3. Moves all extremities 4 with equal strength. Cranial nerves II through XII grossly intact. Skin: Warm and dry. No rashes or lesions. Extremities: No clubbing or cyanosis. No edema. Pedal pulses 2+. Psychiatric: Mood and affect are normal. Insight and judgment are fair.   Data Reviewed:   I have personally reviewed following labs and imaging studies:  Labs: Labs show the  following:   Basic Metabolic Panel: Recent Labs  Lab 07/11/17 1648 07/12/17 0634 07/13/17 0503  NA 141 140 139  K 3.3* 3.5 4.5  CL 106 105 107  CO2 22 22 22   GLUCOSE 182* 120* 121*  BUN 24* 27* 29*  CREATININE 1.65* 1.70* 1.91*  CALCIUM 8.6* 8.7* 8.7*   GFR Estimated Creatinine Clearance: 23.5 mL/min (A) (by C-G formula based on SCr of 1.91 mg/dL (H)). Liver Function Tests: Recent Labs   Lab 07/11/17 1648  AST 19  ALT 15  ALKPHOS 72  BILITOT 1.2  PROT 6.6  ALBUMIN 3.2*   CBC: Recent Labs  Lab 07/11/17 1648 07/11/17 2200  WBC 4.6 6.5  NEUTROABS 3.0 4.7  HGB 13.2 13.3  HCT 39.2 39.7  MCV 91.0 90.2  PLT 174 179   Cardiac Enzymes: Recent Labs  Lab 07/12/17 0050 07/12/17 0634 07/12/17 1117  TROPONINI 0.04* 0.04* 0.04*   CBG: Recent Labs  Lab 07/12/17 0733 07/12/17 1146 07/12/17 1637 07/13/17 0056 07/13/17 0737  GLUCAP 125* 105* 107* 111* 109*   Hgb A1c: Recent Labs    07/11/17 2200  HGBA1C 6.2*   Thyroid function studies: Recent Labs    07/11/17 2200  TSH 0.036*   Microbiology No results found for this or any previous visit (from the past 240 hour(s)).  Procedures and diagnostic studies:  07/11/17: Dg Chest 2 View: My independent review of the image shows: Mild cardiomegaly and interstitial edema compatible with CHF. ICD present.  06/10/17: Myoview: No evidence of reversible ischemia. Large infarct/scar involving the entire inferior wall and a small infarct/scar at the anteroseptal mid ventricle and apex. Global hypokinesis and akinesis of the inferior wall. EF 14%.   Medications:   . allopurinol  100 mg Oral Daily  . aspirin  81 mg Oral Daily  . carvedilol  12.5 mg Oral BID  . ciprofloxacin  250 mg Oral BID  . enoxaparin (LOVENOX) injection  30 mg Subcutaneous Q24H  . furosemide  40 mg Intravenous Q12H  . Influenza vac split quadrivalent PF  0.5 mL Intramuscular Tomorrow-1000  . insulin aspart  0-15 Units Subcutaneous TID WC  . insulin aspart  0-5 Units Subcutaneous QHS  . levothyroxine  150 mcg Oral QAC breakfast  . potassium chloride  40 mEq Oral BID  . sodium chloride flush  3 mL Intravenous Q12H  . spironolactone  25 mg Oral Daily   Continuous Infusions: . sodium chloride       LOS: 2 days   RAMA,CHRISTINA  [facility] Pager [phone]. If unable to reach me by pager, please call my cell phone at [phone]  [phone].  *Please refer to [email or web address], password [email or web address] to get updated schedule on who will round on this patient, as hospitalists switch teams weekly. If 7PM-7AM, please contact night-coverage at [email or web address], password [email or web address] for any overnight needs.  07/13/2017, 8:42 AM

## 2017-07-13 NOTE — Plan of Care (Signed)
Pt. Progressing. Pt. States she is currently not SOB. No lower extremity edema noted.

## 2017-07-13 NOTE — Progress Notes (Signed)
  Echocardiogram 2D Echocardiogram has been performed.  Celene SkeenVijay  Bodin Gorka 07/13/2017, 1:51 PM

## 2017-07-14 LAB — GLUCOSE, CAPILLARY
GLUCOSE-CAPILLARY: 81 mg/dL (ref 65–99)
Glucose-Capillary: 192 mg/dL — ABNORMAL HIGH (ref 65–99)
Glucose-Capillary: 98 mg/dL (ref 65–99)
Glucose-Capillary: 123 mg/dL — ABNORMAL HIGH (ref 65–99)

## 2017-07-14 LAB — BASIC METABOLIC PANEL
Anion gap: 9 (ref 5–15)
BUN: 32 mg/dL — AB (ref 6–20)
CALCIUM: 8.8 mg/dL — AB (ref 8.9–10.3)
CO2: 22 mmol/L (ref 22–32)
CREATININE: 1.96 mg/dL — AB (ref 0.44–1.00)
Chloride: 108 mmol/L (ref 101–111)
GFR, EST AFRICAN AMERICAN: 28 mL/min — AB (ref >60)
GFR, EST NON AFRICAN AMERICAN: 24 mL/min — AB (ref >60)
Glucose, Bld: 73 mg/dL (ref 65–99)
Potassium: 5.6 mmol/L — ABNORMAL HIGH (ref 3.5–5.1)
SODIUM: 139 mmol/L (ref 135–145)

## 2017-07-14 LAB — T4, FREE: FREE T4: 2 ng/dL — AB (ref 0.61–1.12)

## 2017-07-14 LAB — ECHOCARDIOGRAM COMPLETE
HEIGHTINCHES: 64.5 in
WEIGHTICAEL: 2268.8 [oz_av]

## 2017-07-14 MED ORDER — LEVOTHYROXINE SODIUM 25 MCG PO TABS
125.0000 ug | ORAL_TABLET | Freq: Every day | ORAL | Status: DC
  Administered 2017-07-15: 06:00:00 125 ug via ORAL
  Filled 2017-07-14: qty 1

## 2017-07-14 NOTE — Progress Notes (Signed)
MD stated to hold morning dose of spironolactone

## 2017-07-14 NOTE — Progress Notes (Signed)
Progress Note    Crystal Duran  [id number] DOB: 04-03-1946  DOA: 07/11/2017 PCP: Rinaldo CloudHarwani, Mohan, MD    Brief Narrative:   Chief complaint: Follow-up CHF exacerbation  Medical records reviewed and are as summarized below:  Crystal Duran is an 71 y.o. female with PMH of CAD status post RCA stent, dilated cardiomyopathy, status post ICD for cardiac arrest in the past, hypertension, hyperlipidemia, and chronic systolic CHF who was admitted 76/72/910/08/16 for a heart failure exacerbation with a markedly elevated BNP and pulmonary edema on chest x-ray.  Assessment/Plan:   Principal Problem:   Acute on chronic systolic CHF (HCC)/elevated troponin/dilated cardiomyopathy Cardiology following. Myoview done 06/10/17 reviewed. EF 14% on that study. Repeat 2-D echo done 07/13/17, results pending. Unable to use ACE inhibitor/ARB due to increasing creatinine (GFR 22.9). Troponin mildly elevated, but trend flat. Continue Coreg, spironolactone and Lasix 40 mg IV every 12 hours. No significant change in weight, and I/O appears to be balanced.  Active Problems:   Junctional rhythm on tele May need to cut back on Coreg dose. Will defer to cardiology.    Hyperkalemia Discontinue potassium supplementation. RN instructed to hold am dose of Spironolactone.    AKI/stage III CKD Baseline GFR appears to be around 43. Current GFR is lower than baseline at 29. Monitor closely with diuresis.    HYPERTENSION, BENIGN Blood pressure currently controlled.    Hypothyroidism TSH low. Free T4 elevated. Will reduce dose of Synthroid.    Diabetes mellitus type 2 in nonobese (HCC) Hemoglobin A1c 6.2%. Continue moderate scale SSI. CBGs 84-178.    Mild protein-calorie malnutrition (HCC) Body mass index is 23.86 kg/m.   Family Communication/Anticipated D/C date and plan/Code Status   DVT prophylaxis: Lovenox ordered. Code Status: Full Code.  Family Communication: No family currently at bedside.  Left message for husband. Disposition Plan: Home when CHF symptoms improved and cleared by cardiologist. Lives with husband.   Medical Consultants:    Cardiology   Anti-Infectives:    Cipro 07/12/17---> 07/13/17  Subjective:   Feels  Breathing is better and cough has improved. Not coughing up as much sputum. No chest pains.Feels her energy level is "pretty good".  Says her appetite is improving. No BM today, but bowels moved twice yesterday. No dysuria.   Objective:    Vitals:   07/13/17 0642 07/13/17 1218 07/13/17 2030 07/14/17 0517  BP: 106/72 107/63 111/61 118/73  Pulse: 78 93 77 83  Resp: 20 18 18 18   Temp: 98.2 F (36.8 C) 97.7 F (36.5 C) 98.9 F (37.2 C) 98.2 F (36.8 C)  TempSrc: Oral Oral Oral Oral  SpO2: 98% 99% 100% 100%  Weight:    64 kg (141 lb 3.2 oz)  Height:        Intake/Output Summary (Last 24 hours) at 07/14/2017 0751 Last data filed at 07/14/2017 [id number] Gross per 24 hour  Intake 1440 ml  Output 650 ml  Net 790 ml   Filed Weights   07/12/17 0526 07/13/17 0429 07/14/17 0517  Weight: 64 kg (141 lb) 64.3 kg (141 lb 12.8 oz) 64 kg (141 lb 3.2 oz)    Exam: (Unchanged from 07/13/17) General: No acute distress. Cardiovascular: Heart sounds show a regular rate, and rhythm. No gallops or rubs. 2/6 systolic murmur. No JVD. Lungs: Decreased at the bases with a few bibasilar rales. No rales, rhonchi or wheezes. Abdomen: Soft, nontender, nondistended with normal active bowel sounds. No masses. No hepatosplenomegaly. Skin: Warm and dry. No  rashes or lesions. Extremities: No clubbing or cyanosis. No edema. Pedal pulses 2+.  Data Reviewed:   I have personally reviewed following labs and imaging studies:  Labs: Labs show the following:   Basic Metabolic Panel: Recent Labs  Lab 07/11/17 1648 07/12/17 0634 07/13/17 0503 07/14/17 0641  NA 141 140 139 139  K 3.3* 3.5 4.5 5.6*  CL 106 105 107 108  CO2 22 22 22 22   GLUCOSE 182* 120* 121* 73  BUN  24* 27* 29* 32*  CREATININE 1.65* 1.70* 1.91* 1.96*  CALCIUM 8.6* 8.7* 8.7* 8.8*   GFR Estimated Creatinine Clearance: 22.9 mL/min (A) (by C-G formula based on SCr of 1.96 mg/dL (H)). Liver Function Tests: Recent Labs  Lab 07/11/17 1648  AST 19  ALT 15  ALKPHOS 72  BILITOT 1.2  PROT 6.6  ALBUMIN 3.2*   CBC: Recent Labs  Lab 07/11/17 1648 07/11/17 2200  WBC 4.6 6.5  NEUTROABS 3.0 4.7  HGB 13.2 13.3  HCT 39.2 39.7  MCV 91.0 90.2  PLT 174 179   Cardiac Enzymes: Recent Labs  Lab 07/12/17 0050 07/12/17 0634 07/12/17 1117  TROPONINI 0.04* 0.04* 0.04*   CBG: Recent Labs  Lab 07/13/17 0056 07/13/17 0737 07/13/17 1117 07/13/17 1621 07/13/17 2128  GLUCAP 111* 109* 178* 86 84   Hgb A1c: Recent Labs    07/11/17 2200  HGBA1C 6.2*   Thyroid function studies: Recent Labs    07/11/17 2200  TSH 0.036*   Microbiology No results found for this or any previous visit (from the past 240 hour(s)).  Procedures and diagnostic studies:  07/11/17: Dg Chest 2 View: My independent review of the image shows: Mild cardiomegaly and interstitial edema compatible with CHF. ICD present.  06/10/17: Myoview: No evidence of reversible ischemia. Large infarct/scar involving the entire inferior wall and a small infarct/scar at the anteroseptal mid ventricle and apex. Global hypokinesis and akinesis of the inferior wall. EF 14%.   Medications:   . allopurinol  100 mg Oral Daily  . aspirin  81 mg Oral Daily  . carvedilol  12.5 mg Oral BID  . enoxaparin (LOVENOX) injection  30 mg Subcutaneous Q24H  . furosemide  40 mg Intravenous Q12H  . Influenza vac split quadrivalent PF  0.5 mL Intramuscular Tomorrow-1000  . insulin aspart  0-15 Units Subcutaneous TID WC  . insulin aspart  0-5 Units Subcutaneous QHS  . levothyroxine  150 mcg Oral QAC breakfast  . potassium chloride  30 mEq Oral BID  . sodium chloride flush  3 mL Intravenous Q12H  . spironolactone  25 mg Oral Daily    Continuous Infusions: . sodium chloride       LOS: 3 days   RAMA,CHRISTINA  [facility] Pager [phone]. If unable to reach me by pager, please call my cell phone at [phone].  *Please refer to [email or web address], password [email or web address] to get updated schedule on who will round on this patient, as hospitalists switch teams weekly. If 7PM-7AM, please contact night-coverage at [email or web address], password [email or web address] for any overnight needs.  07/14/2017, 7:51 AM

## 2017-07-14 NOTE — Progress Notes (Signed)
EKG completed and placed in chart  Will inform MD CCMD saved strips with arrhythmia  Pt non symptomatic  Will continue to monitor

## 2017-07-14 NOTE — Consult Note (Signed)
Cardiology Consultation:   Patient ID: Crystal Duran; [id number]; 06-Jan-1946   Admit date: 07/11/2017 Date of Consult: 07/14/2017  Primary Care Provider: Rinaldo CloudHarwani, Mohan, MD Primary Cardiologist: Dr. Sharyn LullHarwani Primary Electrophysiologist:  Dr. Graciela HusbandsKlein (2013)   Patient Profile:   Crystal Duran is a 71 y.o. female with a hx of aborted cardiac arrest w/ICD, CAD (PCI to RCA remotely), CM (mixed ischemic/nonischemic), chronic CHF (systolic), HTN, HLD, hypothyroidism, CRI (II), DM, who is being seen today for consideration of ICD upgrade at the request of Dr. Sharyn LullHarwani.  History of Present Illness:   C[facility] Dur[facility] with a week of increasing fatigue, weakness, SOB/DOE, swelling, admitted with CHF exacerbation.  In d/w Dr. Sharyn LullHarwani, the patient's EF has slowly continued to fall, given LBBB would like evaluation for device upgrade to CRT system.  The patient feels less SOB today, no CP, palpitations.  LABS K+ 5.6 BUN/Creat 32/1.96 Trop I: 0.04 x BNP >4500 WBC 6.5 H/H 13/39 Plts 179   Device information: SJM single chamber ICD,  implanted 02/18/10, Dr. Graciela HusbandsKlein  Past Medical History:  Diagnosis Date  . Cardiac arrest - ventricular fibrillation    aborted- 2011  . History of tobacco abuse   . HTN (hypertension)   . Hypercholesteremia   . Hypothyroidism   . 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 Jude Fortify ICD [id number]  . Ischemic cardiomyopathy    severe  . MI (myocardial infarction) (HCC)    Hx of inferior wall MI   . Mild renal insufficiency   . Systolic heart failure    compensated  . Type 2 diabetes mellitus (HCC)     Past Surgical History:  Procedure Laterality Date  . defibrillator implantation     single chamber. St Jude Fortify ICD [id number]. Remote- no.   . unspecified area hysterectomy         Inpatient Medications: Scheduled Meds: . allopurinol  100 mg Oral Daily  . aspirin  81 mg  Oral Daily  . carvedilol  12.5 mg Oral BID  . enoxaparin (LOVENOX) injection  30 mg Subcutaneous Q24H  . furosemide  40 mg Intravenous Q12H  . Influenza vac split quadrivalent PF  0.5 mL Intramuscular Tomorrow-1000  . insulin aspart  0-15 Units Subcutaneous TID WC  . insulin aspart  0-5 Units Subcutaneous QHS  . [START ON 07/15/2017] levothyroxine  125 mcg Oral QAC breakfast  . sodium chloride flush  3 mL Intravenous Q12H  . spironolactone  25 mg Oral Daily   Continuous Infusions: . sodium chloride     PRN Meds: sodium chloride, acetaminophen, ondansetron (ZOFRAN) IV, sodium chloride flush  Allergies:    Allergies  Allergen Reactions  . Eggs Or Egg-Derived Products Nausea And Vomiting    Social History:   Social History   Socioeconomic History  . Marital status: Married    Spouse name: Not on file  . Number of children: Not on file  . Years of education: Not on file  . Highest education level: Not on file  Social Needs  . Financial resource strain: Not on file  . Food insecurity - worry: Not on file  . Food insecurity - inability: Not on file  . Transportation needs - medical: Not on file  . Transportation needs - non-medical: Not on file  Occupational History  . Occupation: retired  Tobacco Use  . Smoking status: Former Smoker    Packs/day: 0.50    Years: 5.00  Pack years: 2.50    Last attempt to quit: 1995    Years since quitting: 23.8  . Smokeless tobacco: Never Used  Substance and Sexual Activity  . Alcohol use: Yes    Comment: occ  . Drug use: No  . Sexual activity: Not on file  Other Topics Concern  . Not on file  Social History Narrative   Married, [place], retired Runner, broadcasting/film/videoteacher.      Family History:    Family History  Problem Relation Age of Onset  . Coronary artery disease Other        postive family Hx     ROS:  Please see the history of present illness.  ROS  All other ROS reviewed and negative.     Physical Exam/Data:    Vitals:   07/13/17 1218 07/13/17 2030 07/14/17 0517 07/14/17 1215  BP: 107/63 111/61 118/73 118/72  Pulse: 93 77 83 85  Resp: 18 18 18    Temp: 97.7 F (36.5 C) 98.9 F (37.2 C) 98.2 F (36.8 C) 97.8 F (36.6 C)  TempSrc: Oral Oral Oral Oral  SpO2: 99% 100% 100% 100%  Weight:   141 lb 3.2 oz (64 kg)   Height:        Intake/Output Summary (Last 24 hours) at 07/14/2017 1338 Last data filed at 07/14/2017 1332 Gross per 24 hour  Intake 1200 ml  Output 1001 ml  Net 199 ml   Filed Weights   07/12/17 0526 07/13/17 0429 07/14/17 0517  Weight: 141 lb (64 kg) 141 lb 12.8 oz (64.3 kg) 141 lb 3.2 oz (64 kg)   Body mass index is 23.86 kg/m.  General:  Well nourished, well developed, in no acute distress HEENT: normal Lymph: no adenopathy Neck: no JVD Endocrine:  No thryomegaly Vascular: No carotid bruits  Cardiac:  RRR; 2/6 SM, no gallops or rubs Lungs:  CTA b/l, no wheezing, rhonchi or rales  Abd: soft, nontender Ext: no edema Musculoskeletal:  No deformities, age appropriate atrophy Skin: warm and dry  Neuro:  no focal abnormalities noted Psych:  Normal affect   EKG:  The EKG was personally reviewed and demonstrates:   SR, LBBB Telemetry:  Telemetry was personally reviewed and demonstrates:   SR/ST, 80's generally  Relevant CV Studies:  07/13/17: TTE Study Conclusions - Left ventricle: The cavity size was moderately dilated. Systolic   function was severely reduced. The estimated ejection fraction   was in the range of 10% to 15%. Diffuse hypokinesis. - Aortic valve: There was mild regurgitation. - Mitral valve: There was severe regurgitation. - Left atrium: The atrium was mildly dilated. - Atrial septum: No defect or patent foramen ovale was identified. - Pericardium, extracardiac: Features were not consistent with   tamponade physiology.   Laboratory Data:  Chemistry Recent Labs  Lab 07/12/17 0634 07/13/17 0503 07/14/17 0641  NA 140 139 139  K 3.5  4.5 5.6*  CL 105 107 108  CO2 22 22 22   GLUCOSE 120* 121* 73  BUN 27* 29* 32*  CREATININE 1.70* 1.91* 1.96*  CALCIUM 8.7* 8.7* 8.8*  GFRNONAA 29* 25* 24*  GFRAA 34* 29* 28*  ANIONGAP 13 10 9     Recent Labs  Lab 07/11/17 1648  PROT 6.6  ALBUMIN 3.2*  AST 19  ALT 15  ALKPHOS 72  BILITOT 1.2   Hematology Recent Labs  Lab 07/11/17 1648 07/11/17 2200  WBC 4.6 6.5  RBC 4.31 4.40  HGB 13.2 13.3  HCT 39.2 39.7  MCV 91.0 90.2  MCH 30.6 30.2  MCHC 33.7 33.5  RDW 16.0* 15.9*  PLT 174 179   Cardiac Enzymes Recent Labs  Lab 07/12/17 0050 07/12/17 0634 07/12/17 1117  TROPONINI 0.04* 0.04* 0.04*   No results for input(s): TROPIPOC in the last 168 hours.  BNP Recent Labs  Lab 07/11/17 1838  BNP >4,500.0*    DDimer No results for input(s): DDIMER in the last 168 hours.  Radiology/Studies:   Dg Chest 2 View Result Date: 07/11/2017 CLINICAL DATA:  SOB. Pt stated that she started feeling sob 3 days ago, and said it's much worse today. She stated that she's been so sob that she couldn't make it alone all the way down the hallway at home to go to the bathroom. Pt mentioned she has felt some tightness across her chest since it started, but said it wasn't really painful just tightness. Pt also mentioned that she's on diuretics and hasn't been able to release any fluids for 2 days. She said her abdomen felt very swollen, painful, and full of fluid, and said she threw up numerous times on the way to the hospital. 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 EXAM: CHEST  2 VIEW COMPARISON:  06/25/2016 FINDINGS: Cardiac silhouette is mildly enlarged. No mediastinal or hilar masses. No convincing adenopathy. Lungs demonstrate interstitial thickening similar to the  prior study. No airspace consolidation. Lungs are mildly hyperexpanded. No pleural effusion or pneumothorax. The left anterior chest wall single lead pacemaker is stable. Skeletal structures are intact. IMPRESSION: 1. Congestive heart failure reflected by mild cardiomegaly and interstitial edema. Electronically Signed   By: Amie Portland M.D.   On: 07/11/2017 18:03    Assessment and Plan:   1. CHF exacerbation     Systolic, felt to be mixed, ischemic/non-ischemic     I/O look generally euvolemic so far, weight is unchanged, patient feel much better     Dinita Migliaccio defer to primary cardiologist  Home meds include carvedilol, lasix, aldactone, I do not see a ACE/ARB though last year records not ramipril, uncertain why stopped IVCD/LBBB in the last year Longstanding hx of CM, hx of VF arrest  EP/Dr. Graciela Husbands f/u has been arranged to re-discuss CRT upgrade  2. CAD 3. HTN    For questions or updates, please contact [facility] Please consult [email or web address] for contact info under Cardiology/STEMI.   Signed, Sheilah Pigeon, PA-C  07/14/2017 1:38 PM  I have seen and examined this patient with Francis Dowse.  Agree with above, note added to reflect my findings.  On exam, RRR, no murmurs, lungs clear.  Presented to the hospital for heart failure exacerbation.  Ejection fraction is low at 10-15%.  Does have a history of VT and has a single-chamber ICD in place.  She has since developed a left bundle branch block.  She would likely benefit from upgrade to CRT-D in the future.  We Dollene Mallery set her up for follow-up in EP clinic for further discussions.  Would try to avoid device upgrade while she is in the hospital as risk of infection is significantly higher due to her prolonged hospital stay.  Konrad Hoak M. Larone Kliethermes MD 07/14/2017 4:54 PM

## 2017-07-14 NOTE — Progress Notes (Signed)
Pt family at bedside requesting update  Paged MD

## 2017-07-14 NOTE — Progress Notes (Signed)
Subjective:  Patient doesn't chest pain or shortness of breath.  Denies palpitation.  Noted to have wide complex tachycardia earlier.  Asymptomatic. 2-D echo showed progressive worsening LV systolic function and left bundle branch block on EKG.  Objective:  Vital Signs in the last 24 hours: Temp:  [97.7 F (36.5 C)-98.9 F (37.2 C)] 98.2 F (36.8 C) (11/15 0517) Pulse Rate:  [77-93] 83 (11/15 0517) Resp:  [18] 18 (11/15 0517) BP: (107-118)/(61-73) 118/73 (11/15 0517) SpO2:  [99 %-100 %] 100 % (11/15 0517) Weight:  [64 kg (141 lb 3.2 oz)] 64 kg (141 lb 3.2 oz) (11/15 0517)  Intake/Output from previous day: 11/14 0701 - 11/15 0700 In: 1200 [P.O.:1200] Out: 650 [Urine:650] Intake/Output from this shift: Total I/O In: 240 [P.O.:240] Out: 500 [Urine:500]  Physical Exam: Neck: no adenopathy, no carotid bruit, no JVD and supple, symmetrical, trachea midline Lungs: clear to auscultation bilaterally Heart: regular rate and rhythm, S1, S2 normal and 2/6 systolic murmur noted Abdomen: soft, non-tender; bowel sounds normal; no masses,  no organomegaly Extremities: extremities normal, atraumatic, no cyanosis or edema  Lab Results: Recent Labs    07/11/17 1648 07/11/17 2200  WBC 4.6 6.5  HGB 13.2 13.3  PLT 174 179   Recent Labs    07/13/17 0503 07/14/17 0641  NA 139 139  K 4.5 5.6*  CL 107 108  CO2 22 22  GLUCOSE 121* 73  BUN 29* 32*  CREATININE 1.91* 1.96*   Recent Labs    07/12/17 0634 07/12/17 1117  TROPONINI 0.04* 0.04*   Hepatic Function Panel Recent Labs    07/11/17 1648  PROT 6.6  ALBUMIN 3.2*  AST 19  ALT 15  ALKPHOS 72  BILITOT 1.2   No results for input(s): CHOL in the last 72 hours. No results for input(s): PROTIME in the last 72 hours.  Imaging: Imaging results have been reviewed and No results found.  Cardiac Studies:  Assessment/Plan:  Resolving Acute on chronic systolic left heart failure Status post atypical Chest pain Dilated  cardiomyopathy Ischemic cardiomyopathy CAD S/P RCA stent Hypertension Hypothyroidism Iatrogenic hyperthyroidism Type 2 DM H/O tobacco use disorder  ICD placement for cardiac arrest and low EF. Possible UTI CKD, III hyperkalemia Plan Agree to hold spironolactone and in view of hyperkalemia Will get EP consult for ICD interrogation and possible upgrade in view of worsening LV systolic function and widened QRS with left bundle branch block morphology   LOS: 3 days    Rinaldo CloudHarwani, Matin Mattioli 07/14/2017, 10:06 AM

## 2017-07-14 NOTE — Progress Notes (Signed)
Physical Therapy Treatment Patient Details Name: Crystal Duran MRN: [id number] DOB: March 23, 1946 Today's Date: 07/14/2017    History of Present Illness Pt is a 71 y.o. female with PMH significant for systolic HF, DM, AICD-defibrillator placement, HTN, HLD, who presents on 07/11/17 with SOB, chest tightness and productive cough; CXR consistent with pulmonary edema. Being worked up for CHF exacerbation.     PT Comments    Pt in bed upon arrival. HR 95 bpm before treatment. Pt able to get EOB with no assistance. Supervision for safety during sit to stand. VCs for hand placement when sitting down. 2 L/min O2 [place] while walking. Ambulated with VCs for erect posture and proximity to walker. Required several standing rest breaks secondary to SOB and increased HR. Pt had one episode of dizziness in hallway; after standing break and deep breathing dizziness went away. Ambulated back towards room. Required seated break when HR > 150 bpm. After seated break HR back down to 120 bpm. Performed seated exercises; cues for speed.   Follow Up Recommendations  Outpatient PT     Equipment Recommendations  Rolling walker with 5" wheels    Recommendations for Other Services       Precautions / Restrictions Precautions Precautions: Fall Restrictions Weight Bearing Restrictions: No    Mobility  Bed Mobility Overal bed mobility: Independent                Transfers Overall transfer level: Needs assistance Equipment used: Rolling walker (2 wheeled) Transfers: Sit to/from Stand Sit to Stand: Supervision         General transfer comment: Supervision for safety.  Ambulation/Gait Ambulation/Gait assistance: Min guard Ambulation Distance (Feet): 40 Feet(40x 2 seated rest break due to dizziness) Assistive device: Rolling walker (2 wheeled) Gait Pattern/deviations: Step-through pattern;Decreased stride length;Trunk flexed Gait velocity: Decreased   General Gait Details: Slow ambulation with  RW. Several standing rest breaks due to SOB and increased HR. HR at baseline was 95 bpm. Upon ambulating HR increased to >150 bpm. Pt had one moment of dizziness but after standing rest break dizziness went away. Seated rest break and deep breathing brought HR back down. Flexed posture while ambulating. VCs to correct and stand closer to RW.   Stairs            Wheelchair Mobility    Modified Rankin (Stroke Patients Only)       Balance Overall balance assessment: Needs assistance Sitting-balance support: No upper extremity supported;Feet supported Sitting balance-Leahy Scale: Good Sitting balance - Comments: Able to perform seated exercises and toe/heel raises with stable trunk.   Standing balance support: Bilateral upper extremity supported;During functional activity Standing balance-Leahy Scale: Fair Standing balance comment: Able to static stand without UE support. Relies on UE support while ambulating.                            Cognition Arousal/Alertness: Awake/alert Behavior During Therapy: WFL for tasks assessed/performed Overall Cognitive Status: Within Functional Limits for tasks assessed                                        Exercises General Exercises - Lower Extremity Ankle Circles/Pumps: AROM;Both;20 reps;Supine Long Arc Quad: AROM;Both;10 reps;Seated Heel Slides: AROM;Both;10 reps;Seated Hip Flexion/Marching: AROM;Both;Seated(30 secs x 2) Toe Raises: AROM;Both;10 reps;Seated Heel Raises: AROM;Both;10 reps;Seated    General Comments  Pertinent Vitals/Pain Pain Assessment: No/denies pain    Home Living                      Prior Function            PT Goals (current goals can now be found in the care plan section) Acute Rehab PT Goals Patient Stated Goal: not discussed PT Goal Formulation: With patient Time For Goal Achievement: 07/26/17 Potential to Achieve Goals: Good Progress towards PT goals:  Progressing toward goals    Frequency    Min 3X/week      PT Plan Current plan remains appropriate    Co-evaluation              AM-PAC PT "6 Clicks" Daily Activity  Outcome Measure  Difficulty turning over in bed (including adjusting bedclothes, sheets and blankets)?: None Difficulty moving from lying on back to sitting on the side of the bed? : None Difficulty sitting down on and standing up from a chair with arms (e.g., wheelchair, bedside commode, etc,.)?: A Little Help needed moving to and from a bed to chair (including a wheelchair)?: A Little Help needed walking in hospital room?: A Little Help needed climbing 3-5 steps with a railing? : A Little 6 Click Score: 20    End of Session Equipment Utilized During Treatment: Gait belt;Oxygen Activity Tolerance: Patient tolerated treatment well;Patient limited by fatigue Patient left: in chair;with call bell/phone within reach;with family/visitor present Nurse Communication: Mobility status PT Visit Diagnosis: Unsteadiness on feet (R26.81);Muscle weakness (generalized) (M62.81)     Time: [id number] PT Time Calculation (min) (ACUTE ONLY): 23 min  Charges:  $Gait Training: 8-22 mins $Therapeutic Exercise: 8-22 mins                    G Codes:       [name], SPTA   [name] 07/14/2017, 3:02 PM

## 2017-07-14 NOTE — Progress Notes (Addendum)
MD aware of heart rhythm asked RN to inform Harwani MD [facility] aware  No new orders at this time  Will continue to monitor

## 2017-07-15 DIAGNOSIS — E876 Hypokalemia: Secondary | ICD-10-CM

## 2017-07-15 LAB — GLUCOSE, CAPILLARY
GLUCOSE-CAPILLARY: 127 mg/dL — AB (ref 65–99)
Glucose-Capillary: 166 mg/dL — ABNORMAL HIGH (ref 65–99)

## 2017-07-15 LAB — BASIC METABOLIC PANEL
Anion gap: 9 (ref 5–15)
BUN: 36 mg/dL — ABNORMAL HIGH (ref 6–20)
CHLORIDE: 104 mmol/L (ref 101–111)
CO2: 23 mmol/L (ref 22–32)
Calcium: 8.5 mg/dL — ABNORMAL LOW (ref 8.9–10.3)
Creatinine, Ser: 2.04 mg/dL — ABNORMAL HIGH (ref 0.44–1.00)
GFR calc non Af Amer: 23 mL/min — ABNORMAL LOW (ref >60)
GFR, EST AFRICAN AMERICAN: 27 mL/min — AB (ref >60)
Glucose, Bld: 86 mg/dL (ref 65–99)
POTASSIUM: 5.6 mmol/L — AB (ref 3.5–5.1)
SODIUM: 136 mmol/L (ref 135–145)

## 2017-07-15 MED ORDER — SPIRONOLACTONE 25 MG PO TABS: 25.0000 mg | ORAL_TABLET | Freq: Every day | ORAL | Status: DC

## 2017-07-15 MED ORDER — LEVOTHYROXINE SODIUM 150 MCG PO TABS: 150.0000 ug | ORAL_TABLET | Freq: Every day | ORAL | 2 refills | Status: DC

## 2017-07-15 NOTE — Progress Notes (Signed)
Physical Therapy Treatment Patient Details Name: Crystal Duran MRN: [id number] DOB: 1945-12-11 Today's Date: 07/15/2017    History of Present Illness Pt is a 71 y.o. female with PMH significant for systolic HF, DM, AICD-defibrillator placement, HTN, HLD, who presents on 07/11/17 with SOB, chest tightness and productive cough; CXR consistent with pulmonary edema. Being worked up for CHF exacerbation.     PT Comments    Pt eager to get out bed and walk. Reported that she was excited about leaving today. Pt indpendent with all bed mobility. Supervision for sit to stand transfers. Was able to ambulate further today. No c/o of pain. 3 standing rest breaks due to increased fatigue. HR peaked at 113bpm during gait and stairs today O2 at 2L/min [place] >90%. Pt will continue to benefit from PT to help increase strength and independence with mobility  Follow Up Recommendations  Outpatient PT     Equipment Recommendations  Rolling walker with 5" wheels    Recommendations for Other Services       Precautions / Restrictions Precautions Precautions: Fall Restrictions Weight Bearing Restrictions: No    Mobility  Bed Mobility Overal bed mobility: Independent                Transfers Overall transfer level: Needs assistance Equipment used: Rolling walker (2 wheeled) Transfers: Sit to/from Stand Sit to Stand: Supervision         General transfer comment: Supervision for safety.  Ambulation/Gait Ambulation/Gait assistance: Min guard;Supervision Ambulation Distance (Feet): 240 Feet(3 standing rest breaks) Assistive device: Rolling walker (2 wheeled) Gait Pattern/deviations: Step-through pattern;Decreased stride length;Trunk flexed Gait velocity: Decreased   General Gait Details: Pt able to self correct proper technique during ambulation with RW. Reported increased fatigue. 3 standing rest breaks O2 on 2L/min [place] >90%; HR 90-113bpm throughout session   Stairs Stairs: Yes    Stair Management: Two rails Used left hand to simulate railing  Number of Stairs: 3(3stairs (ascend/descend x2) Min guard for safety during stair training. Cues for proper hand placement and technique. $    Wheelchair Mobility    Modified Rankin (Stroke Patients Only)       Balance Overall balance assessment: Needs assistance Sitting-balance support: No upper extremity supported;Feet supported Sitting balance-Leahy Scale: Good     Standing balance support: Bilateral upper extremity supported;During functional activity Standing balance-Leahy Scale: Fair Standing balance comment: Able to static stand without UE support. Relies on RW with mobility                            Cognition Arousal/Alertness: Awake/alert Behavior During Therapy: WFL for tasks assessed/performed Overall Cognitive Status: Within Functional Limits for tasks assessed                                        Exercises General Exercises - Lower Extremity Long Arc Quad: AROM;Both;10 reps;Seated Hip Flexion/Marching: AROM;Both;Seated;20 reps Toe Raises: AROM;Both;10 reps;Seated Heel Raises: AROM;Both;10 reps;Seated Other Exercises Other Exercises: towel squeeze, 10 reps, 5 sec holds, seated    General Comments        Pertinent Vitals/Pain Pain Assessment: No/denies pain    Home Living                      Prior Function            PT Goals (current goals  can now be found in the care plan section) Acute Rehab PT Goals Patient Stated Goal: not discussed Progress towards PT goals: Progressing toward goals    Frequency    Min 3X/week      PT Plan Current plan remains appropriate    Co-evaluation              AM-PAC PT "6 Clicks" Daily Activity  Outcome Measure  Difficulty turning over in bed (including adjusting bedclothes, sheets and blankets)?: None Difficulty moving from lying on back to sitting on the side of the bed? : None Difficulty  sitting down on and standing up from a chair with arms (e.g., wheelchair, bedside commode, etc,.)?: A Little Help needed moving to and from a bed to chair (including a wheelchair)?: A Little Help needed walking in hospital room?: A Little Help needed climbing 3-5 steps with a railing? : A Little 6 Click Score: 20    End of Session Equipment Utilized During Treatment: Gait belt;Oxygen Activity Tolerance: Patient tolerated treatment well Patient left: in chair;with call bell/phone within reach;with chair alarm set Nurse Communication: Mobility status PT Visit Diagnosis: Unsteadiness on feet (R26.81);Muscle weakness (generalized) (M62.81)     Time: 1010-1035 PT Time Calculation (min) (ACUTE ONLY): 25 min  Charges:    $Gait Training   $Therapeutic Exercise                   G Codes:  Functional Assessment Tool Used: AM-PAC 6 Clicks Basic Mobility    Crystal Duran, SPTA    Crystal RadonSybil Calirose Duran 07/15/2017, 10:57 AM

## 2017-07-15 NOTE — Progress Notes (Signed)
Subjective:  Doing well denies any chest pain or shortness of breath. Denies any palpitations. Occasional episodes of nonsustained VT. Patient will discuss with Dr. Graciela HusbandsKlein regarding ICD upgrade as outpatient.  Objective:  Vital Signs in the last 24 hours: Temp:  [97.7 F (36.5 C)-98.2 F (36.8 C)] 97.7 F (36.5 C) (11/16 0815) Pulse Rate:  [78-86] 85 (11/16 0815) Resp:  [18-20] 18 (11/16 0815) BP: (106-118)/(59-72) 111/69 (11/16 0815) SpO2:  [100 %] 100 % (11/16 0815) Weight:  [63.8 kg (140 lb 9.6 oz)] 63.8 kg (140 lb 9.6 oz) (11/16 0449)  Intake/Output from previous day: 11/15 0701 - 11/16 0700 In: 960 [P.O.:960] Out: 1726 [Urine:1725; Stool:1] Intake/Output from this shift: No intake/output data recorded.  Physical Exam: Neck: no adenopathy, no carotid bruit, no JVD and supple, symmetrical, trachea midline Lungs: clear to auscultation bilaterally Heart: regular rate and rhythm, S1, S2 normal and 2/6 systolic murmur noted Abdomen: soft, non-tender; bowel sounds normal; no masses,  no organomegaly Extremities: extremities normal, atraumatic, no cyanosis or edema  Lab Results: No results for input(s): WBC, HGB, PLT in the last 72 hours. Recent Labs    07/14/17 0641 07/15/17 0328  NA 139 136  K 5.6* 5.6*  CL 108 104  CO2 22 23  GLUCOSE 73 86  BUN 32* 36*  CREATININE 1.96* 2.04*   Recent Labs    07/12/17 1117  TROPONINI 0.04*   Hepatic Function Panel No results for input(s): PROT, ALBUMIN, AST, ALT, ALKPHOS, BILITOT, BILIDIR, IBILI in the last 72 hours. No results for input(s): CHOL in the last 72 hours. No results for input(s): PROTIME in the last 72 hours.  Imaging: Imaging results have been reviewed and No results found.  Cardiac Studies:  Assessment/Plan:  ResolvingAcute on chronic systolic left heart failure Status post atypicalChest pain Dilated cardiomyopathy Ischemic cardiomyopathy CAD S/P RCA stent Hypertension Hypothyroidism Iatrogenic  hyperthyroidism Type 2 DM H/O tobacco use disorder  ICD placement for cardiac arrest and low EF. Possible UTI CKD, III hyperkalemia Plan Continue present management I will sign off please call if needed Follow-up with EP as scheduled Follow-up with me in 2 weeks   LOS: 4 days    Rinaldo Duran, Crystal Gieske 07/15/2017, 8:25 AM

## 2017-07-15 NOTE — Progress Notes (Signed)
Cardiac monitoring reported patient had 7 beats of V-Tach. Patient remained asymptomatic. Notified Craige CottaKirby. No orders where given.

## 2017-07-15 NOTE — Progress Notes (Signed)
Pt stated she needs a walker for discharge but is unable to wait for delivery in hospital. Pt stated her husband will come back to pick up the walker. Pt family provided with front desk phone number and to call when arriving therefore staff can deliver to his car. Case manager aware.

## 2017-07-15 NOTE — Progress Notes (Signed)
Pt IV discontinued, catheter intact and telemetry removed. Pt has all belongings packed. Discharge teaching provided at bedside. Awaiting pt transportation

## 2017-07-15 NOTE — Progress Notes (Signed)
Pt discharged via wheelchair with nursing student

## 2017-07-15 NOTE — Discharge Summary (Signed)
Physician Discharge Summary  LURIE MULLANE ZOX:[id number] DOB: Feb 11, 1946 DOA: 07/11/2017  PCP: Rinaldo Cloud, MD  Admit date: 07/11/2017 Discharge date: 07/15/2017  Admitted From: Home Discharge disposition: Home   Recommendations for Outpatient Follow-Up:   1. Spironolactone on hold until she follows up with cardiology to recheck potassium levels. 2. Consider adding ACE-I/ARB if renal function stabilizes. 3. Synthroid adjusted due to low TSH/over replacement. Recommend repeat TSH/free T4 in 6 weeks.   Discharge Diagnosis:   Principal Problem:    CHF exacerbation (HCC) Active Problems:    HYPERTENSION, BENIGN    Hypothyroidism    Mild protein-calorie malnutrition (HCC)    Diabetes mellitus type 2 in nonobese (HCC)    Acute kidney injury superimposed on chronic kidney disease (HCC)    Hypokalemia/hyperkalemia  Discharge Condition: Improved.  Diet recommendation: Low sodium, heart healthy.  Carbohydrate-modified.    History of Present Illness:   Crystal Duran is an 71 y.o. female with PMH of CAD status post RCA stent, dilated cardiomyopathy, status post ICD for cardiac arrest in the past, hypertension, hyperlipidemia, and chronic systolic CHF who was admitted 81/19/14 for a heart failure exacerbation with a markedly elevated BNP and pulmonary edema on chest x-ray.  Hospital Course by Problem:   Principal Problem:   Acute on chronic systolic CHF (HCC)/elevated troponin/dilated cardiomyopathy Cardiology following. Myoview done 06/10/17 reviewed. EF 14% on that study. Repeat 2-D echo done 07/13/17, EF 10-15 percent with diffuse hypokinesis. Unable to use ACE inhibitor/ARB due to increasing creatinine (GFR 22.9). Troponin mildly elevated, but trend flat. Diuresed with IV Lasix while in hospital. Discharged home on Coreg and Lasix, but hold spironolactone until potassium normalizes. Outpatient follow-up with EP for CRT upgrade.  Active Problems:   Junctional  rhythm on tele Transient.  Hypokalemia/Hyperkalemia Initially hypokalemic. Became hyperkalemic after supplementation and in the setting of being on spironolactone. Supplementation discontinued but potassium remained elevated at discharge so the patient was instructed to hold her spironolactone and she'll she sees her urologist in follow-up and has a repeat potassium level checked.    AKI/stage III CKD Baseline GFR appears to be around 43. Current GFR is lower than baseline at 29. Recommend close outpatient follow-up.    HYPERTENSION, BENIGN Blood pressure currently controlled.    Hypothyroidism TSH low. Free T4 elevated. Synthroid dose reduced from 175 g daily hour forward 150 g daily.    Diabetes mellitus type 2 in nonobese (HCC) Hemoglobin A1c 6.2%. Treated with moderate scale SSI. CBGs 84-178. Resume metformin.    Mild protein-calorie malnutrition (HCC) Body mass index is 23.76 kg/m.   Medical Consultants:    Cardiology   Discharge Exam:   Vitals:   07/15/17 0449 07/15/17 0815  BP: 113/61 111/69  Pulse: 78 85  Resp: 20 18  Temp: 98.1 F (36.7 C) 97.7 F (36.5 C)  SpO2: 100% 100%   Vitals:   07/14/17 1215 07/14/17 2217 07/15/17 0449 07/15/17 0815  BP: 118/72 (!) 106/59 113/61 111/69  Pulse: 85 86 78 85  Resp:  20 20 18   Temp: 97.8 F (36.6 C) 98.2 F (36.8 C) 98.1 F (36.7 C) 97.7 F (36.5 C)  TempSrc: Oral Oral Oral Oral  SpO2: 100% 100% 100% 100%  Weight:   63.8 kg (140 lb 9.6 oz)   Height:        General exam: Appears calm and comfortable.  Respiratory system: Clear to auscultation. Respiratory effort normal. Cardiovascular system: S1 & S2 heard, RRR. No JVD,  rubs, gallops or  clicks. II/VI systolic murmur. Gastrointestinal system: Abdomen is nondistended, soft and nontender. No organomegaly or masses felt. Normal bowel sounds heard. Central nervous system: Alert and oriented. No focal neurological deficits. Extremities: No clubbing,  or  cyanosis. No edema. Skin: No rashes, lesions or ulcers. Psychiatry: Judgement and insight appear normal. Mood & affect appropriate.    The results of significant diagnostics from this hospitalization (including imaging, microbiology, ancillary and laboratory) are listed below for reference.     Procedures and Diagnostic Studies:   Dg Chest 2 View  Result Date: 07/11/2017 CLINICAL DATA:  SOB. Pt stated that she started feeling sob 3 days ago, and said it's much worse today. She stated that she's been so sob that she couldn't make it alone all the way down the hallway at home to go to the bathroom. Pt mentioned she has felt some tightness across her chest since it started, but said it wasn't really painful just tightness. Pt also mentioned that she's on diuretics and hasn't been able to release any fluids for 2 days. She said her abdomen felt very swollen, painful, and full of fluid, and said she threw up numerous times on the way to the hospital. 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 EXAM: CHEST  2 VIEW COMPARISON:  06/25/2016 FINDINGS: Cardiac silhouette is mildly enlarged. No mediastinal or hilar masses. No convincing adenopathy. Lungs demonstrate interstitial thickening similar to the prior study. No airspace consolidation. Lungs are mildly hyperexpanded. No pleural effusion or pneumothorax. The left anterior chest wall single lead pacemaker is stable. Skeletal structures are intact. IMPRESSION: 1. Congestive heart failure reflected by mild cardiomegaly and interstitial edema. Electronically Signed   By: Amie Portlandavid  Ormond M.D.   On: 07/11/2017 18:03     Labs:   Basic Metabolic Panel: Recent Labs  Lab 07/11/17 1648 07/12/17 0634 07/13/17 0503 07/14/17 0641  07/15/17 0328  NA 141 140 139 139 136  K 3.3* 3.5 4.5 5.6* 5.6*  CL 106 105 107 108 104  CO2 22 22 22 22 23   GLUCOSE 182* 120* 121* 73 86  BUN 24* 27* 29* 32* 36*  CREATININE 1.65* 1.70* 1.91* 1.96* 2.04*  CALCIUM 8.6* 8.7* 8.7* 8.8* 8.5*   GFR Estimated Creatinine Clearance: 22 mL/min (A) (by C-G formula based on SCr of 2.04 mg/dL (H)). Liver Function Tests: Recent Labs  Lab 07/11/17 1648  AST 19  ALT 15  ALKPHOS 72  BILITOT 1.2  PROT 6.6  ALBUMIN 3.2*   CBC: Recent Labs  Lab 07/11/17 1648 07/11/17 2200  WBC 4.6 6.5  NEUTROABS 3.0 4.7  HGB 13.2 13.3  HCT 39.2 39.7  MCV 91.0 90.2  PLT 174 179   Cardiac Enzymes: Recent Labs  Lab 07/12/17 0050 07/12/17 0634 07/12/17 1117  TROPONINI 0.04* 0.04* 0.04*   BNP: Invalid input(s): POCBNP CBG: Recent Labs  Lab 07/14/17 1140 07/14/17 1641 07/14/17 2216 07/15/17 0810 07/15/17 1144  GLUCAP 192* 98 123* 127* 166*    Discharge Instructions:   Discharge Instructions    (HEART FAILURE PATIENTS) Call MD:  Anytime you have any of the following symptoms: 1) 3 pound weight gain in 24 hours or 5 pounds in 1 week 2) shortness of breath, with or without a dry hacking cough 3) swelling in the hands, feet or stomach 4) if you have to sleep on extra pillows at night in order  to breathe.   Complete by:  As directed    Call MD for:  extreme fatigue   Complete by:  As directed    Call MD for:  persistant dizziness or light-headedness   Complete by:  As directed    Diet - low sodium heart healthy   Complete by:  As directed    Discharge instructions   Complete by:  As directed    Hold your spironolactone until you see Dr. Sharyn LullHarwani in follow up due to your potassium level being high. Your dose of synthroid was reduced to 150 mcg daily due to your thyroid hormone levels being too high.   Increase activity slowly   Complete by:  As directed      Allergies as of 07/15/2017      Reactions   Eggs Or Egg-derived Products Nausea  And Vomiting      Medication List    STOP taking these medications   spironolactone 25 MG tablet Commonly known as:  ALDACTONE     TAKE these medications   aspirin 81 MG tablet Take 81 mg by mouth daily.   carvedilol 25 MG tablet Commonly known as:  COREG Take 0.5 tablets (12.5 mg total) by mouth 2 (two) times daily.   furosemide 40 MG tablet Commonly known as:  LASIX Take 40 mg by mouth 2 (two) times daily.   levothyroxine 150 MCG tablet Commonly known as:  SYNTHROID, LEVOTHROID Take 1 tablet (150 mcg total) daily by mouth. What changed:    medication strength  how much to take   metFORMIN 500 MG 24 hr tablet Commonly known as:  GLUCOPHAGE-XR Take 500 mg daily with lunch by mouth.   nitroGLYCERIN 0.4 MG SL tablet Commonly known as:  NITROSTAT Place 1 tablet (0.4 mg total) under the tongue every 5 (five) minutes x 3 doses as needed for chest pain.            Durable Medical Equipment  (From admission, onward)        Start     Ordered   07/15/17 1327  For home use only DME Walker rolling  Once    Question:  Patient needs a walker to treat with the following condition  Answer:  CHF (congestive heart failure) (HCC)   07/15/17 1326     Follow-up Information    Duke SalviaKlein, Steven C, MD Follow up on 07/26/2017.   Specialty:  Cardiology Why:  2:30PM Contact information: [address] [place] [address] [phone]        Rinaldo CloudHarwani, Mohan, MD. Go on 07/19/2017.   Specialty:  Cardiology Why:  @4 :15pm Contact information: [address] [place] [address] [phone]            Time coordinating discharge: > 35 minutes.  SignedTrula Ore:  Shalynn Jorstad  Pager [phone] [facility] 07/15/2017, 3:55 PM

## 2017-07-15 NOTE — Care Management Important Message (Signed)
Important Message  Patient Details  Name: Crystal Duran MRN: [id number] Date of Birth: 03/15/1946   Medicare Important Message Given:  Yes    Kyla BalzarineShealy, Crystal Duran 07/15/2017, 10:38 AM

## 2017-07-26 ENCOUNTER — Other Ambulatory Visit: Payer: Self-pay

## 2017-07-26 ENCOUNTER — Ambulatory Visit: Payer: Medicare PPO | Admitting: Internal Medicine

## 2017-07-26 VITALS — BP 133/84 | HR 99 | Ht 64.0 in | Wt 138.0 lb

## 2017-07-26 DIAGNOSIS — Z95810 Presence of automatic (implantable) cardiac defibrillator: Secondary | ICD-10-CM

## 2017-07-26 DIAGNOSIS — I255 Ischemic cardiomyopathy: Secondary | ICD-10-CM

## 2017-07-26 DIAGNOSIS — I5022 Chronic systolic (congestive) heart failure: Secondary | ICD-10-CM | POA: Diagnosis not present

## 2017-07-26 LAB — CUP PACEART INCLINIC DEVICE CHECK
Battery Remaining Longevity: 42 mo
Brady Statistic RV Percent Paced: 0 %
Date Time Interrogation Session: 20181127174257
HighPow Impedance: 59.625
Implantable Lead Location: 753860
Implantable Lead Model: 7122
Lead Channel Pacing Threshold Amplitude: 0.75 V
Lead Channel Pacing Threshold Pulse Width: 0.4 ms
Lead Channel Setting Sensing Sensitivity: 0.5 mV
MDC IDC LEAD IMPLANT DT: 20110622
MDC IDC MSMT LEADCHNL RV IMPEDANCE VALUE: 387.5 Ohm
MDC IDC MSMT LEADCHNL RV SENSING INTR AMPL: 11.7 mV
MDC IDC PG IMPLANT DT: 20110622
MDC IDC PG SERIAL: 608161
MDC IDC SET LEADCHNL RV PACING AMPLITUDE: 2 V
MDC IDC SET LEADCHNL RV PACING PULSEWIDTH: 0.4 ms

## 2017-07-26 MED ORDER — ISOSORB DINITRATE-HYDRALAZINE 20-37.5 MG PO TABS: 1.0000 | ORAL_TABLET | Freq: Two times a day (BID) | ORAL | 5 refills | Status: DC

## 2017-07-26 NOTE — Patient Instructions (Addendum)
Medication Instructions: Your physician has recommended you make the following change in your medication:  -1) START Bidil - Take 1 tablet by mouth twice daily  Labwork: None Ordered  Procedures/Testing: None Ordered  Follow-Up: Your physician recommends that you schedule a follow-up appointment as needed with Dr. Graciela HusbandsKlein  Remote monitoring is used to monitor your Pacemaker from home. This monitoring reduces the number of office visits required to check your device to one time per year. It allows us to keep an eye on the functioning of your device to ensure it is working properly. You are scheduled for a device check from home on 10/25/17. You may send your transmission at any time that day. If you have a wireless device, the transmission will be sent automatically. After your physician reviews your transmission, you will receive a postcard with your next transmission date.    -- Please do not drive for 6 months form February 28, 2017 due to your Ventricular Fibrillation --   If you need a refill on your cardiac medications before your next appointment, please call your pharmacy.

## 2017-07-26 NOTE — Progress Notes (Signed)
ELECTROPHYSIOLOGY CONSULT NOTE  Patient ID: RIHANA KIDDY, MRN: [id number], DOB/AGE: February 10, 1946 71 y.o. Admit date: (Not on file) Date of Consult: 07/26/2017  Primary Physician: Rinaldo Cloud, MD Primary Cardiologist: MH LEMA HEINKEL is a 71 y.o. female who is being seen today for the evaluation of ICD at the request of MH   Chief Complaint: CHF   HPI SUKI CROCKETT is a 71 y.o. female who is status post St Jude  ICD implantation 2011 for reported cardiac arrest.  She has not been seen since 2013.  She has ischemic cardiomyopathy   She was recently admitted to the hospital 11/18 for congestive heart failure.   Echocardiogram ejection fraction was 10-15% severe MR with surprisingly little left ventricular dilatation Myoview 10/18 demonstrated EF 20% with no evidence of ischemia  She has had 2 recurrent syncopal episodes.  10/17 and 7/18 were associated with ventricular fibrillation.  These episodes are not available but have been identified by [facility] Jude  Date Cr K  11/18 2.04 5.6  11/18  1.27 4.4    Past Medical History:  Diagnosis Date  . Cardiac arrest - ventricular fibrillation    aborted- 2011  . History of tobacco abuse   . HTN (hypertension)   . Hypercholesteremia   . Hypothyroidism   . 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 Jude Fortify ICD [id number]  . Ischemic cardiomyopathy    severe  . MI (myocardial infarction) (HCC)    Hx of inferior wall MI   . Mild renal insufficiency   . Systolic heart failure    compensated  . Type 2 diabetes mellitus [facility])       Surgical History:  Past Surgical History:  Procedure Laterality Date  . defibrillator implantation     single chamber. St Jude Fortify ICD 782-95A. Remote- no.   . unspecified area hysterectomy       Home Meds: Prior to Admission medications   Medication Sig Start Date End Date Taking? Authorizing Provider  aspirin 81 MG tablet Take 81  mg by mouth daily.      [provider]  carvedilol (COREG) 25 MG tablet Take 0.5 tablets (12.5 mg total) by mouth 2 (two) times daily. 06/29/16   Rinaldo Cloud, MD  furosemide (LASIX) 40 MG tablet Take 40 mg by mouth 2 (two) times daily.      [provider]  levothyroxine (SYNTHROID, LEVOTHROID) 150 MCG tablet Take 1 tablet (150 mcg total) daily by mouth. 07/15/17   Rama, Maryruth Bun, MD  metFORMIN (GLUCOPHAGE-XR) 500 MG 24 hr tablet Take 500 mg daily with lunch by mouth. 06/15/17   [provider]  nitroGLYCERIN (NITROSTAT) 0.4 MG SL tablet Place 1 tablet (0.4 mg total) under the tongue every 5 (five) minutes x 3 doses as needed for chest pain. 06/29/16   Rinaldo Cloud, MD      Allergies:  Allergies  Allergen Reactions  . Eggs Or Egg-Derived Products Nausea And Vomiting    Social History   Socioeconomic History  . Marital status: Married    Spouse name: Not on file  . Number of children: Not on file  . Years of education: Not on file  . Highest education level: Not on file  Social Needs  . Financial resource strain: Not on file  . Food insecurity - worry: Not on file  . Food insecurity - inability: Not on file  . Transportation needs - medical: Not on file  .  Transportation needs - non-medical: Not on file  Occupational History  . Occupation: retired  Tobacco Use  . Smoking status: Former Smoker    Packs/day: 0.50    Years: 5.00    Pack years: 2.50    Last attempt to quit: 1995    Years since quitting: 23.9  . Smokeless tobacco: Never Used  Substance and Sexual Activity  . Alcohol use: Yes    Comment: occ  . Drug use: No  . Sexual activity: Not on file  Other Topics Concern  . Not on file  Social History Narrative   Married, lives in [place], retired Runner, broadcasting/film/videoteacher.       Family History  Problem Relation Age of Onset  . Coronary artery disease Other        postive family Hx     ROS:  Please see the history of present illness.     All  other systems reviewed and negative.    Physical Exam:  Blood pressure 133/84, pulse 99, height 5\' 4"  (1.626 m), weight 138 lb (62.6 kg), SpO2 100 %. General: Well developed, well nourished female in no acute distress. Head: Normocephalic, atraumatic, sclera non-icteric, no xanthomas, nares are without discharge. EENT: normal Lymph Nodes:  none Back: without scoliosis/kyphosis , no CVA tendersness Neck: Negative for carotid bruits. JVD 8-10 Lungs: Clear bilaterally to auscultation without wheezes, rales, or rhonchi. Breathing is unlabored. Heart: RRR with S1 S2.  3/6 systolic  murmur , rubs, or gallops appreciated. Abdomen: Soft, non-tender, non-distended with normoactive bowel sounds. No hepatomegaly. No rebound/guarding. No obvious abdominal masses. Msk:  Strength and tone appear normal for age. Extremities: No clubbing or cyanosis. tr edema.  Distal pedal pulses are 2+ and equal bilaterally. Skin: Warm and Dry Neuro: Alert and oriented X 3. CN III-XII intact Grossly normal sensory and motor function . Psych:  Responds to questions appropriately with a normal affect.      Labs: Cardiac Enzymes No results for input(s): CKTOTAL, CKMB, TROPONINI in the last 72 hours. CBC Lab Results  Component Value Date   WBC 6.5 07/11/2017   HGB 13.3 07/11/2017   HCT 39.7 07/11/2017   MCV 90.2 07/11/2017   PLT 179 07/11/2017   PROTIME: No results for input(s): LABPROT, INR in the last 72 hours. Chemistry No results for input(s): NA, K, CL, CO2, BUN, CREATININE, CALCIUM, PROT, BILITOT, ALKPHOS, ALT, AST, GLUCOSE in the last 168 hours.  Invalid input(s): LABALBU Lipids Lab Results  Component Value Date   CHOL 117 06/26/2016   HDL 45 06/26/2016   LDLCALC 55 06/26/2016   TRIG 84 06/26/2016   BNP Pro B Natriuretic peptide (BNP)  Date/Time Value Ref Range Status  11/08/2008 10:20 AM 693.0 (H) 0.0 - 100.0 pg/mL Final   Thyroid Function Tests: No results for input(s): TSH, T4TOTAL,  T3FREE, THYROIDAB in the last 72 hours.  Invalid input(s): FREET3    Miscellaneous No results found for: DDIMER  Radiology/Studies:  Dg Chest 2 View  Result Date: 07/11/2017 CLINICAL DATA:  SOB. Pt stated that she started feeling sob 3 days ago, and said it's much worse today. She stated that she's been so sob that she couldn't make it alone all the way down the hallway at home to go to the bathroom. Pt mentioned she has felt some tightness across her chest since it started, but said it wasn't really painful just tightness. Pt also mentioned that she's on diuretics and hasn't been able to release any fluids for 2 days. She  said her abdomen felt very swollen, painful, and full of fluid, and said she threw up numerous times on the way to the hospital. 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 EXAM: CHEST  2 VIEW COMPARISON:  06/25/2016 FINDINGS: Cardiac silhouette is mildly enlarged. No mediastinal or hilar masses. No convincing adenopathy. Lungs demonstrate interstitial thickening similar to the prior study. No airspace consolidation. Lungs are mildly hyperexpanded. No pleural effusion or pneumothorax. The left anterior chest wall single lead pacemaker is stable. Skeletal structures are intact. IMPRESSION: 1. Congestive heart failure reflected by mild cardiomegaly and interstitial edema. Electronically Signed   By: Amie Portlandavid  Ormond M.D.   On: 07/11/2017 18:03    EKG: nsr 91 20/15/44 LVH w repol without monophasic R waves  11/18 ECG was also reviewed with monophasic R waves in lead I-L   Assessment and Plan:  Nonischemic and ischemic cardiomyopathy  Congestive heart failure class III  Left bundle branch block/left LVH with QRS widening  Mitral  regurgitation-severe  Recurrent syncope   Renal insufficiency grade 3  The patient has severe cardiomyopathy.  She has class III heart failure.  Contributing factors to her cardiomyopathy could include a genetic defect as her son was also afflicted.  She could also have progressive coronary disease but has not had a catheterization for some years, I presume because of concern for her renal function.  She has had repeated nonischemic Myoviews; sensitivity however is lacking  She has progressive mitral regurgitation presumably.  There has been gradual increase in her LV volumes by Myoview scan.  These were reviewed with Dr. Champ MungoMS.  It was his presumption that the non-dilated LV by echo is probably under reading.  Her left bundle branch block is not clear.  Her ECG today is notably not left bundle branch block although the ECG a couple of weeks ago was.  It is probably not unreasonable to consider CRT upgrade with the issues above limiting expected outcomes.  I would like to review this with colleagues.  She is probably a candidate for mitral clip although this will be available for another 6 months.  Her last laboratories were notable for hyperkalemia.  We will try and get these from Dr. Annitta JerseyHarwani's office.  In the interim, we will begin her on BiDil 1 tablet twice daily.  With her history of syncope, we also need to try to further elucidate from her defibrillator which was cleared on 11/18 whether she had ICD discharges  Addendum.  We now have evidence of ventricular fibrillation.  Most recent episode July 2018.  She would be advised not to drive for 6 months following that event     Sherryl MangesSteven Klein

## 2017-07-28 ENCOUNTER — Other Ambulatory Visit: Payer: Self-pay | Admitting: Internal Medicine

## 2017-10-25 ENCOUNTER — Ambulatory Visit (INDEPENDENT_AMBULATORY_CARE_PROVIDER_SITE_OTHER): Payer: Medicare PPO | Admitting: *Deleted

## 2017-10-25 DIAGNOSIS — I5022 Chronic systolic (congestive) heart failure: Secondary | ICD-10-CM

## 2017-10-25 DIAGNOSIS — I255 Ischemic cardiomyopathy: Secondary | ICD-10-CM | POA: Diagnosis not present

## 2017-10-25 NOTE — Progress Notes (Signed)
Remote ICD transmission.   

## 2017-10-27 ENCOUNTER — Encounter: Payer: Self-pay | Admitting: Cardiology

## 2017-11-07 ENCOUNTER — Telehealth: Payer: Self-pay | Admitting: *Deleted

## 2017-11-07 DIAGNOSIS — I255 Ischemic cardiomyopathy: Secondary | ICD-10-CM

## 2017-11-07 DIAGNOSIS — I5022 Chronic systolic (congestive) heart failure: Secondary | ICD-10-CM

## 2017-11-07 NOTE — Telephone Encounter (Signed)
ICD remote alert received today- VF episode terminated with ATP 11/02/17 11:12pm.  [name] was laying down reading when she felt her heart go out of rhythm but then it stopped. No shocks- the episode was terminated with 1 round of ATP. She has had increased stress lately due to her son's death and settling his social security. She denies any missed medications. I advised her of driving restrictions x 6 months per NCDMV. I will review the episode with Dr. Graciela HusbandsKlein tomorrow afternoon and call her back with any recommendations. She verbalizes understanding.

## 2017-11-10 LAB — CUP PACEART REMOTE DEVICE CHECK
Battery Remaining Longevity: 43 mo
Battery Remaining Percentage: 36 %
Battery Voltage: 2.89 V
Brady Statistic RV Percent Paced: 0 %
Date Time Interrogation Session: 20190226110605
HIGH POWER IMPEDANCE MEASURED VALUE: 59 Ohm
HighPow Impedance: 59 Ohm
Implantable Lead Implant Date: 20110622
Implantable Lead Location: 753860
Implantable Lead Model: 7122
Lead Channel Pacing Threshold Amplitude: 0.75 V
Lead Channel Sensing Intrinsic Amplitude: 11.7 mV
Lead Channel Setting Pacing Amplitude: 2 V
Lead Channel Setting Sensing Sensitivity: 0.5 mV
MDC IDC MSMT LEADCHNL RV IMPEDANCE VALUE: 380 Ohm
MDC IDC MSMT LEADCHNL RV PACING THRESHOLD PULSEWIDTH: 0.4 ms
MDC IDC PG IMPLANT DT: 20110622
MDC IDC PG SERIAL: 608161
MDC IDC SET LEADCHNL RV PACING PULSEWIDTH: 0.4 ms

## 2017-12-16 NOTE — Addendum Note (Signed)
Addended by: Bethanie DickerLONG, Judy Goodenow M on: 12/16/2017 11:55 AM   Modules accepted: Orders

## 2017-12-16 NOTE — Telephone Encounter (Signed)
Written recommendations received to get labs for electrolyte evaluation. No answer, no personalized VM, no DPR on file. Orders placed for BMP and Mag.

## 2017-12-19 ENCOUNTER — Other Ambulatory Visit: Payer: Self-pay | Admitting: Internal Medicine

## 2018-01-16 ENCOUNTER — Telehealth: Payer: Self-pay

## 2018-01-16 NOTE — Telephone Encounter (Signed)
LVM for pt to call device clinic back regarding shock from 01/14/18@254am .

## 2018-01-24 ENCOUNTER — Encounter: Payer: Medicare HMO | Admitting: *Deleted

## 2018-01-27 ENCOUNTER — Telehealth: Payer: Self-pay | Admitting: Cardiology

## 2018-01-27 ENCOUNTER — Encounter: Payer: Self-pay | Admitting: Cardiology

## 2018-01-27 NOTE — Telephone Encounter (Signed)
LMOVM requesting that pt send manual transmission b/c home monitor has not updated in at least 7 days.    

## 2018-01-27 NOTE — Telephone Encounter (Signed)
2nd attempt to contact pt regarding shock from 01/14/18@0254am .

## 2018-02-03 ENCOUNTER — Telehealth: Payer: Self-pay | Admitting: Cardiology

## 2018-02-03 NOTE — Telephone Encounter (Signed)
LMOVM requesting that pt send manual transmission b/c home monitor has not updated in at least 7 days.    

## 2018-02-13 ENCOUNTER — Other Ambulatory Visit: Payer: Self-pay | Admitting: Internal Medicine

## 2018-02-13 ENCOUNTER — Telehealth: Payer: Self-pay | Admitting: Cardiology

## 2018-02-13 NOTE — Telephone Encounter (Signed)
LMOVM requesting that pt send manual transmission b/c home monitor has not updated in at least 7 days.    

## 2018-02-17 ENCOUNTER — Encounter: Payer: Self-pay | Admitting: Cardiology

## 2018-02-27 ENCOUNTER — Telehealth: Payer: Self-pay | Admitting: Cardiology

## 2018-02-27 NOTE — Telephone Encounter (Signed)
LMOVM requesting that pt send manual transmission b/c home monitor has not updated in at least 7 days.    

## 2018-03-10 ENCOUNTER — Encounter: Payer: Self-pay | Admitting: Cardiology

## 2018-03-17 ENCOUNTER — Telehealth: Payer: Self-pay

## 2018-03-17 NOTE — Telephone Encounter (Signed)
LVM for pt to return call to device clinic regarding ATP episodes

## 2018-03-17 NOTE — Telephone Encounter (Signed)
LVM on home phone to call device clinic back regarding ATP therapies

## 2018-03-17 NOTE — Telephone Encounter (Signed)
LMOVM reminding pt to send remote transmission.   

## 2018-04-07 ENCOUNTER — Inpatient Hospital Stay (HOSPITAL_COMMUNITY)
Admission: EM | Admit: 2018-04-07 | Discharge: 2018-04-11 | DRG: 194 | Disposition: A | Discharge status: Home / Self Care | Payer: Medicare HMO | Attending: Cardiology | Admitting: Cardiology

## 2018-04-07 ENCOUNTER — Encounter (HOSPITAL_COMMUNITY): Payer: Self-pay | Admitting: *Deleted

## 2018-04-07 ENCOUNTER — Other Ambulatory Visit: Payer: Self-pay

## 2018-04-07 ENCOUNTER — Emergency Department (HOSPITAL_COMMUNITY): Payer: Medicare HMO

## 2018-04-07 DIAGNOSIS — Z79890 Hormone replacement therapy (postmenopausal): Secondary | ICD-10-CM | POA: Diagnosis not present

## 2018-04-07 DIAGNOSIS — I2510 Atherosclerotic heart disease of native coronary artery without angina pectoris: Secondary | ICD-10-CM | POA: Diagnosis present

## 2018-04-07 DIAGNOSIS — E440 Moderate protein-calorie malnutrition: Secondary | ICD-10-CM | POA: Diagnosis present

## 2018-04-07 DIAGNOSIS — N183 Chronic kidney disease, stage 3 (moderate): Secondary | ICD-10-CM | POA: Diagnosis present

## 2018-04-07 DIAGNOSIS — R188 Other ascites: Secondary | ICD-10-CM | POA: Diagnosis present

## 2018-04-07 DIAGNOSIS — I130 Hypertensive heart and chronic kidney disease with heart failure and stage 1 through stage 4 chronic kidney disease, or unspecified chronic kidney disease: Secondary | ICD-10-CM | POA: Diagnosis present

## 2018-04-07 DIAGNOSIS — I5022 Chronic systolic (congestive) heart failure: Secondary | ICD-10-CM | POA: Diagnosis present

## 2018-04-07 DIAGNOSIS — N179 Acute kidney failure, unspecified: Secondary | ICD-10-CM | POA: Diagnosis present

## 2018-04-07 DIAGNOSIS — Z955 Presence of coronary angioplasty implant and graft: Secondary | ICD-10-CM

## 2018-04-07 DIAGNOSIS — I255 Ischemic cardiomyopathy: Secondary | ICD-10-CM | POA: Diagnosis present

## 2018-04-07 DIAGNOSIS — J181 Lobar pneumonia, unspecified organism: Secondary | ICD-10-CM | POA: Diagnosis present

## 2018-04-07 DIAGNOSIS — R7989 Other specified abnormal findings of blood chemistry: Secondary | ICD-10-CM

## 2018-04-07 DIAGNOSIS — Z8674 Personal history of sudden cardiac arrest: Secondary | ICD-10-CM | POA: Diagnosis not present

## 2018-04-07 DIAGNOSIS — I447 Left bundle-branch block, unspecified: Secondary | ICD-10-CM | POA: Diagnosis present

## 2018-04-07 DIAGNOSIS — Z87891 Personal history of nicotine dependence: Secondary | ICD-10-CM

## 2018-04-07 DIAGNOSIS — J189 Pneumonia, unspecified organism: Secondary | ICD-10-CM

## 2018-04-07 DIAGNOSIS — Z9981 Dependence on supplemental oxygen: Secondary | ICD-10-CM

## 2018-04-07 DIAGNOSIS — E785 Hyperlipidemia, unspecified: Secondary | ICD-10-CM | POA: Diagnosis present

## 2018-04-07 DIAGNOSIS — Z90710 Acquired absence of both cervix and uterus: Secondary | ICD-10-CM

## 2018-04-07 DIAGNOSIS — Z7982 Long term (current) use of aspirin: Secondary | ICD-10-CM | POA: Diagnosis not present

## 2018-04-07 DIAGNOSIS — E7800 Pure hypercholesterolemia, unspecified: Secondary | ICD-10-CM | POA: Diagnosis present

## 2018-04-07 DIAGNOSIS — Z7984 Long term (current) use of oral hypoglycemic drugs: Secondary | ICD-10-CM

## 2018-04-07 DIAGNOSIS — Z95810 Presence of automatic (implantable) cardiac defibrillator: Secondary | ICD-10-CM | POA: Diagnosis not present

## 2018-04-07 DIAGNOSIS — I252 Old myocardial infarction: Secondary | ICD-10-CM | POA: Diagnosis not present

## 2018-04-07 DIAGNOSIS — E039 Hypothyroidism, unspecified: Secondary | ICD-10-CM | POA: Diagnosis present

## 2018-04-07 DIAGNOSIS — I340 Nonrheumatic mitral (valve) insufficiency: Secondary | ICD-10-CM | POA: Diagnosis present

## 2018-04-07 DIAGNOSIS — Z6822 Body mass index (BMI) 22.0-22.9, adult: Secondary | ICD-10-CM

## 2018-04-07 DIAGNOSIS — R1084 Generalized abdominal pain: Secondary | ICD-10-CM

## 2018-04-07 DIAGNOSIS — I472 Ventricular tachycardia: Secondary | ICD-10-CM | POA: Diagnosis not present

## 2018-04-07 DIAGNOSIS — E860 Dehydration: Secondary | ICD-10-CM | POA: Diagnosis present

## 2018-04-07 DIAGNOSIS — Z91012 Allergy to eggs: Secondary | ICD-10-CM

## 2018-04-07 DIAGNOSIS — M549 Dorsalgia, unspecified: Secondary | ICD-10-CM | POA: Diagnosis present

## 2018-04-07 DIAGNOSIS — E1122 Type 2 diabetes mellitus with diabetic chronic kidney disease: Secondary | ICD-10-CM | POA: Diagnosis present

## 2018-04-07 HISTORY — DX: Chronic kidney disease, stage 3 unspecified: N18.30

## 2018-04-07 HISTORY — DX: Low back pain, unspecified: M54.50

## 2018-04-07 HISTORY — DX: Dependence on supplemental oxygen: Z99.81

## 2018-04-07 HISTORY — DX: Presence of automatic (implantable) cardiac defibrillator: Z95.810

## 2018-04-07 HISTORY — DX: Chronic kidney disease, stage 3 (moderate): N18.3

## 2018-04-07 HISTORY — DX: Low back pain: M54.5

## 2018-04-07 HISTORY — DX: Unspecified osteoarthritis, unspecified site: M19.90

## 2018-04-07 HISTORY — DX: Pneumonia, unspecified organism: J18.9

## 2018-04-07 HISTORY — DX: Other chronic pain: G89.29

## 2018-04-07 LAB — BASIC METABOLIC PANEL
Anion gap: 16 — ABNORMAL HIGH (ref 5–15)
BUN: 27 mg/dL — AB (ref 8–23)
CHLORIDE: 106 mmol/L (ref 98–111)
CO2: 20 mmol/L — ABNORMAL LOW (ref 22–32)
Calcium: 8.8 mg/dL — ABNORMAL LOW (ref 8.9–10.3)
Creatinine, Ser: 1.8 mg/dL — ABNORMAL HIGH (ref 0.44–1.00)
GFR calc Af Amer: 31 mL/min — ABNORMAL LOW (ref >60)
GFR, EST NON AFRICAN AMERICAN: 27 mL/min — AB (ref >60)
GLUCOSE: 159 mg/dL — AB (ref 70–99)
POTASSIUM: 3.5 mmol/L (ref 3.5–5.1)
Sodium: 142 mmol/L (ref 135–145)

## 2018-04-07 LAB — HEPATIC FUNCTION PANEL
ALBUMIN: 3.5 g/dL (ref 3.5–5.0)
ALK PHOS: 64 U/L (ref 38–126)
ALT: 21 U/L (ref 0–44)
AST: 26 U/L (ref 15–41)
Bilirubin, Direct: 0.3 mg/dL — ABNORMAL HIGH (ref 0.0–0.2)
Indirect Bilirubin: 0.9 mg/dL (ref 0.3–0.9)
TOTAL PROTEIN: 7.5 g/dL (ref 6.5–8.1)
Total Bilirubin: 1.2 mg/dL (ref 0.3–1.2)

## 2018-04-07 LAB — CBC
HEMATOCRIT: 36.5 % (ref 36.0–46.0)
Hemoglobin: 11.6 g/dL — ABNORMAL LOW (ref 12.0–15.0)
MCH: 29 pg (ref 26.0–34.0)
MCHC: 31.8 g/dL (ref 30.0–36.0)
MCV: 91.3 fL (ref 78.0–100.0)
Platelets: 163 10*3/uL (ref 150–400)
RBC: 4 MIL/uL (ref 3.87–5.11)
RDW: 16.1 % — AB (ref 11.5–15.5)
WBC: 5 10*3/uL (ref 4.0–10.5)

## 2018-04-07 LAB — I-STAT TROPONIN, ED
Troponin i, poc: 0.2 ng/mL (ref 0.00–0.08)
Troponin i, poc: 0.18 ng/mL (ref 0.00–0.08)

## 2018-04-07 LAB — I-STAT CG4 LACTIC ACID, ED: Lactic Acid, Venous: 2.24 mmol/L (ref 0.5–1.9)

## 2018-04-07 LAB — LIPASE, BLOOD: Lipase: 24 U/L (ref 11–51)

## 2018-04-07 LAB — BRAIN NATRIURETIC PEPTIDE: B Natriuretic Peptide: >4500 pg/mL — ABNORMAL HIGH (ref 0.0–100.0)

## 2018-04-07 LAB — HEMOGLOBIN A1C
HEMOGLOBIN A1C: 5.9 % — AB (ref 4.8–5.6)
Mean Plasma Glucose: 122.63 mg/dL

## 2018-04-07 LAB — GLUCOSE, CAPILLARY: GLUCOSE-CAPILLARY: 139 mg/dL — AB (ref 70–99)

## 2018-04-07 LAB — TROPONIN I: TROPONIN I: 0.18 ng/mL — AB (ref <0.03)

## 2018-04-07 MED ORDER — SODIUM CHLORIDE 0.9 % IV SOLN
500.0000 mg | INTRAVENOUS | Status: DC
  Administered 2018-04-08 – 2018-04-10 (×3): 500 mg via INTRAVENOUS
  Filled 2018-04-07 (×4): qty 500

## 2018-04-07 MED ORDER — CARVEDILOL 12.5 MG PO TABS
12.5000 mg | ORAL_TABLET | Freq: Two times a day (BID) | ORAL | Status: DC
  Administered 2018-04-07 – 2018-04-11 (×8): 12.5 mg via ORAL
  Filled 2018-04-07 (×8): qty 1

## 2018-04-07 MED ORDER — ACETAMINOPHEN 325 MG PO TABS
650.0000 mg | ORAL_TABLET | ORAL | Status: DC | PRN
  Administered 2018-04-08 – 2018-04-11 (×8): 650 mg via ORAL
  Filled 2018-04-07 (×8): qty 2

## 2018-04-07 MED ORDER — SODIUM CHLORIDE 0.9 % IV SOLN
1.0000 g | INTRAVENOUS | Status: DC
  Administered 2018-04-08 – 2018-04-10 (×3): 1 g via INTRAVENOUS
  Filled 2018-04-07 (×4): qty 10

## 2018-04-07 MED ORDER — ASPIRIN 81 MG PO CHEW
324.0000 mg | CHEWABLE_TABLET | ORAL | Status: AC
  Filled 2018-04-07: qty 4

## 2018-04-07 MED ORDER — LEVOTHYROXINE SODIUM 75 MCG PO TABS
175.0000 ug | ORAL_TABLET | Freq: Every day | ORAL | Status: DC
  Administered 2018-04-08 – 2018-04-11 (×4): 175 ug via ORAL
  Filled 2018-04-07 (×4): qty 1

## 2018-04-07 MED ORDER — ASPIRIN 300 MG RE SUPP
300.0000 mg | RECTAL | Status: AC
  Filled 2018-04-07: qty 1

## 2018-04-07 MED ORDER — LEVALBUTEROL HCL 1.25 MG/0.5ML IN NEBU
1.2500 mg | INHALATION_SOLUTION | Freq: Three times a day (TID) | RESPIRATORY_TRACT | Status: DC
  Administered 2018-04-07 – 2018-04-09 (×6): 1.25 mg via RESPIRATORY_TRACT
  Filled 2018-04-07 (×7): qty 0.5

## 2018-04-07 MED ORDER — ONDANSETRON HCL 4 MG/2ML IJ SOLN: 4.0000 mg | Freq: Four times a day (QID) | INTRAMUSCULAR | Status: DC | PRN

## 2018-04-07 MED ORDER — ENSURE ENLIVE PO LIQD
237.0000 mL | Freq: Two times a day (BID) | ORAL | Status: DC
  Administered 2018-04-08 – 2018-04-11 (×5): 237 mL via ORAL

## 2018-04-07 MED ORDER — ISOSORB DINITRATE-HYDRALAZINE 20-37.5 MG PO TABS
1.0000 | ORAL_TABLET | Freq: Two times a day (BID) | ORAL | Status: DC
Start: 2018-04-07 — End: 2018-04-11
  Administered 2018-04-07 – 2018-04-11 (×8): 1 via ORAL
  Filled 2018-04-07 (×8): qty 1

## 2018-04-07 MED ORDER — SODIUM CHLORIDE 0.9 % IV SOLN
1.0000 g | Freq: Once | INTRAVENOUS | Status: AC
  Administered 2018-04-07: 1 g via INTRAVENOUS
  Filled 2018-04-07: qty 10

## 2018-04-07 MED ORDER — ASPIRIN 81 MG PO CHEW
324.0000 mg | CHEWABLE_TABLET | Freq: Once | ORAL | Status: AC
  Administered 2018-04-07: 324 mg via ORAL
  Filled 2018-04-07: qty 4

## 2018-04-07 MED ORDER — SODIUM CHLORIDE 0.9 % IV SOLN
500.0000 mg | Freq: Once | INTRAVENOUS | Status: AC
  Administered 2018-04-07: 500 mg via INTRAVENOUS
  Filled 2018-04-07: qty 500

## 2018-04-07 MED ORDER — HEPARIN SODIUM (PORCINE) 5000 UNIT/ML IJ SOLN
5000.0000 [IU] | Freq: Three times a day (TID) | INTRAMUSCULAR | Status: DC
Start: 2018-04-07 — End: 2018-04-11
  Administered 2018-04-07 – 2018-04-11 (×10): 5000 [IU] via SUBCUTANEOUS
  Filled 2018-04-07 (×10): qty 1

## 2018-04-07 MED ORDER — ROSUVASTATIN CALCIUM 20 MG PO TABS
20.0000 mg | ORAL_TABLET | Freq: Every day | ORAL | Status: DC
  Administered 2018-04-07 – 2018-04-11 (×5): 20 mg via ORAL
  Filled 2018-04-07 (×5): qty 1

## 2018-04-07 MED ORDER — ASPIRIN EC 81 MG PO TBEC
81.0000 mg | DELAYED_RELEASE_TABLET | Freq: Every day | ORAL | Status: DC
  Administered 2018-04-08 – 2018-04-11 (×4): 81 mg via ORAL
  Filled 2018-04-07 (×4): qty 1

## 2018-04-07 MED ORDER — SODIUM CHLORIDE 0.9 % IV SOLN
500.0000 mg | INTRAVENOUS | Status: DC
  Filled 2018-04-07: qty 500

## 2018-04-07 MED ORDER — SODIUM CHLORIDE 0.9 % IV SOLN
1.0000 g | INTRAVENOUS | Status: DC
  Filled 2018-04-07: qty 10

## 2018-04-07 MED ORDER — FENTANYL CITRATE (PF) 100 MCG/2ML IJ SOLN
50.0000 ug | Freq: Once | INTRAMUSCULAR | Status: AC
  Administered 2018-04-07: 50 ug via INTRAVENOUS
  Filled 2018-04-07: qty 2

## 2018-04-07 MED ORDER — LACTATED RINGERS IV BOLUS
500.0000 mL | Freq: Once | INTRAVENOUS | Status: AC
  Administered 2018-04-07: 500 mL via INTRAVENOUS

## 2018-04-07 MED ORDER — SODIUM CHLORIDE 0.9 % IV SOLN
INTRAVENOUS | Status: DC
  Administered 2018-04-07 – 2018-04-10 (×5): via INTRAVENOUS

## 2018-04-07 MED ORDER — INSULIN ASPART 100 UNIT/ML ~~LOC~~ SOLN
0.0000 [IU] | Freq: Three times a day (TID) | SUBCUTANEOUS | Status: DC
  Administered 2018-04-08: 2 [IU] via SUBCUTANEOUS

## 2018-04-07 MED ORDER — NITROGLYCERIN 0.4 MG SL SUBL: 0.4000 mg | SUBLINGUAL_TABLET | SUBLINGUAL | Status: DC | PRN

## 2018-04-07 MED ORDER — ASPIRIN EC 81 MG PO TBEC: 81.0000 mg | DELAYED_RELEASE_TABLET | Freq: Every day | ORAL | Status: DC

## 2018-04-07 NOTE — ED Notes (Signed)
Dr. Lynford Humphreyickens notified of elevated trop of 0.18

## 2018-04-07 NOTE — ED Notes (Signed)
Patient transported to X-ray 

## 2018-04-07 NOTE — ED Notes (Signed)
Patient transported to CT 

## 2018-04-07 NOTE — ED Notes (Signed)
Dr. Criss AlvineGoldston notified of elevated CG-4 of 2.24

## 2018-04-07 NOTE — H&P (Signed)
Crystal Duran is an 72 y.o. female.   Chief Complaint: cough associated with pleuritic chest pain back pain and abdominal pain for last few days Crystal Duran is 72 year old female with past medical history significant for coronary artery disease history of inferior wall myocardial infarction status post PCI to RCA ischemic/nonischemic dilated cardiomyopathy status post ICD history of cardiac arrest in the past, hypertension, hyperlipidemia, hypothyroidism, chronic kidney disease stage III, history of congestive heart failure secondary to severe lead depressed LV systolic function, chronic mitral regurgitation, came to the ER complaining of cough associated with pleuritic chest pain back pain and abdominal pain for last few days associated with nausea vomiting and diarrhea. States has not been able to keep anything down for last few days denies any fever or chills. States has not voided since yesterday. Patient denies any anginal chest pain. Denies PND orthopnea or leg swelling. Workup in the ED showed minimally elevated troponin I EKG showed normal sinus rhythm with old left bundle branch block. Patient was also noted to have new infiltrate in the right mid lung field and minimally elevated lactic acid. Patient was started on IV antibiotics in ED after giving blood cultures. Past Medical History:  Diagnosis Date  . Cardiac arrest - ventricular fibrillation    aborted- 2011  . History of tobacco abuse   . HTN (hypertension)   . Hypercholesteremia   . Hypothyroidism   . 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 Jude Fortify ICD 123-40Q  . Ischemic cardiomyopathy    severe  . MI (myocardial infarction) ([place])    Hx of inferior wall MI   . Mild renal insufficiency   . Systolic heart failure    compensated  . Type 2 diabetes mellitus ([place])     Past Surgical History:  Procedure Laterality Date  . defibrillator implantation     single chamber. [place] ICD 932-35T. Remote- no.   . unspecified area hysterectomy      Family History  Problem Relation Age of Onset  . Coronary artery disease Other        postive family Hx   Social History:  reports that she quit smoking about 24 years ago. She has a 2.50 pack-year smoking history. She has never used smokeless tobacco. She reports that she drinks alcohol. She reports that she does not use drugs.  Allergies:  Allergies  Allergen Reactions  . Eggs Or Egg-Derived Products Nausea And Vomiting     (Not in a hospital admission)  Results for orders placed or performed during the hospital encounter of 04/07/18 (from the past 48 hour(s))  I-stat troponin, ED     Status: Abnormal   Collection Time: 04/07/18  2:15 PM  Result Value Ref Range   Troponin i, poc 0.20 (HH) 0.00 - 0.08 ng/mL   Comment NOTIFIED PHYSICIAN    Comment 3            Comment: Due to the release kinetics of cTnI, a negative result within the first hours of the onset of symptoms does not rule out myocardial infarction with certainty. If myocardial infarction is still suspected, repeat the test at appropriate intervals.   Basic metabolic panel     Status: Abnormal   Collection Time: 04/07/18  2:16 PM  Result Value Ref Range   Sodium 142 135 - 145 mmol/L   Potassium 3.5 3.5 - 5.1 mmol/L   Chloride 106 98 - 111 mmol/L   CO2 20 (L) 22 -  32 mmol/L   Glucose, Bld 159 (H) 70 - 99 mg/dL   BUN 27 (H) 8 - 23 mg/dL   Creatinine, Ser 1.80 (H) 0.44 - 1.00 mg/dL   Calcium 8.8 (L) 8.9 - 10.3 mg/dL   GFR calc non Af Amer 27 (L) >60 mL/min   GFR calc Af Amer 31 (L) >60 mL/min    Comment: (NOTE) The eGFR has been calculated using the CKD EPI equation. This calculation has not been validated in all clinical situations. eGFR's persistently <60 mL/min signify possible Chronic Kidney Disease.    Anion gap 16 (H) 5 - 15    Comment: Performed at [facility], [place] [address] [place], [place] [address]  CBC      Status: Abnormal   Collection Time: 04/07/18  2:16 PM  Result Value Ref Range   WBC 5.0 4.0 - 10.5 K/uL   RBC 4.00 3.87 - 5.11 MIL/uL   Hemoglobin 11.6 (L) 12.0 - 15.0 g/dL   HCT 36.5 36.0 - 46.0 %   MCV 91.3 78.0 - 100.0 fL   MCH 29.0 26.0 - 34.0 pg   MCHC 31.8 30.0 - 36.0 g/dL   RDW 16.1 (H) 11.5 - 15.5 %   Platelets 163 150 - 400 K/uL    Comment: Performed at [facility] [address], [place], [place] [address]  Hepatic function panel     Status: Abnormal   Collection Time: 04/07/18  4:24 PM  Result Value Ref Range   Total Protein 7.5 6.5 - 8.1 g/dL   Albumin 3.5 3.5 - 5.0 g/dL   AST 26 15 - 41 U/L   ALT 21 0 - 44 U/L   Alkaline Phosphatase 64 38 - 126 U/L   Total Bilirubin 1.2 0.3 - 1.2 mg/dL   Bilirubin, Direct 0.3 (H) 0.0 - 0.2 mg/dL   Indirect Bilirubin 0.9 0.3 - 0.9 mg/dL    Comment: Performed at [facility] [address], [place], [place] [address]  Lipase, blood     Status: None   Collection Time: 04/07/18  4:24 PM  Result Value Ref Range   Lipase 24 11 - 51 U/L    Comment: Performed at [facility], [place] [address] [place], [place] [address]  Troponin I     Status: Abnormal   Collection Time: 04/07/18  4:24 PM  Result Value Ref Range   Troponin I 0.18 (HH) <0.03 ng/mL    Comment: CRITICAL RESULT CALLED TO, READ BACK BY AND VERIFIED WITH: Melida Gimenez [id number] 04/07/18 D BRADLEY Performed at [facility], [place] [address], [place], [place] [address]   I-Stat CG4 Lactic Acid, ED     Status: Abnormal   Collection Time: 04/07/18  4:32 PM  Result Value Ref Range   Lactic Acid, Venous 2.24 (HH) 0.5 - 1.9 mmol/L   Comment NOTIFIED PHYSICIAN   I-Stat Troponin, ED (not at [facility])     Status: Abnormal   Collection Time: 04/07/18  6:49 PM  Result Value Ref Range   Troponin i, poc 0.18 (HH) 0.00 - 0.08 ng/mL   Comment NOTIFIED PHYSICIAN    Comment 3            Comment: Due to the release kinetics of cTnI, a negative result within the first hours of  the onset of symptoms does not rule out myocardial infarction with certainty. If myocardial infarction is still suspected, repeat the test at appropriate intervals.  Ct Abdomen Pelvis Wo Contrast  Result Date: 04/07/2018 CLINICAL DATA:  Abdominal pain and low back pain. Difficulty swallowing. EXAM: CT ABDOMEN AND PELVIS WITHOUT CONTRAST TECHNIQUE: Multidetector CT imaging of the abdomen and pelvis was performed following the standard protocol without IV contrast. COMPARISON:  CT scan of the pelvis dated 08/02/2005 FINDINGS: Lower chest: Moderate right and small left pleural effusions. Cardiomegaly. AICD in place. Slight peribronchial thickening at the lung bases. Hepatobiliary: No focal liver abnormality is seen. No gallstones, gallbladder wall thickening, or biliary dilatation. Pancreas: Atrophy of the pancreatic body and tail. Scattered calcifications around the pancreatic head are most likely vascular in origin. Spleen: Normal in size without focal abnormality. Adrenals/Urinary Tract: Adrenal glands are unremarkable. Kidneys are normal, without renal calculi, focal lesion, or hydronephrosis. Bladder is unremarkable. Stomach/Bowel: Stomach is within normal limits. Appendix appears normal. No evidence of bowel wall thickening, distention, or inflammatory changes. Vascular/Lymphatic: Aortic atherosclerosis. No enlarged abdominal or pelvic lymph nodes. Reproductive: Status post hysterectomy. No adnexal masses. Other: Small amount of ascites in the pelvic cul-de-sac and in the pericolic gutters. Diffuse anasarca. No free air. Musculoskeletal: No acute abnormality. Degenerative disc disease at L4-5 and L5-S1. 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 Slight arthritic changes of the right hip joint. IMPRESSION: 1. Small amount of ascites with diffuse anasarca and bilateral pleural effusions, right greater than left. 2. Cardiomegaly. 3.  Aortic Atherosclerosis  (ICD10-I70.0). Electronically Signed   By: Lorriane Shire M.D.   On: 04/07/2018 17:21   Dg Chest 2 View  Result Date: 04/07/2018 CLINICAL DATA:  Chest tightness and low back pain with ongoing cough and myalgias. EXAM: CHEST - 2 VIEW COMPARISON:  07/11/2017. FINDINGS: Stable enlarged cardiac silhouette. Stable left subclavian AICD lead. Decreased depth of inspiration. Interval mild patchy opacity in the right mid and lower lung zone and minimal patchy opacity in the left lower lung zone. The interstitial markings are less prominent. The pulmonary vasculature remains prominent. Small right pleural effusion. Unremarkable bones. IMPRESSION: 1. Mild patchy opacity in the right mid and lower lung zone and minimal patchy opacity in the left lower lung zone, suspicious for pneumonia. 2. Interval small right pleural effusion. 3. Resolved interstitial pulmonary edema. 4. Stable cardiomegaly and pulmonary vascular congestion. Electronically Signed   By: Claudie Revering M.D.   On: 04/07/2018 14:55    Review of Systems  Constitutional: Positive for malaise/fatigue. Negative for chills and fever.  Eyes: Negative for blurred vision.  Respiratory: Positive for cough and shortness of breath.   Cardiovascular: Positive for chest pain. Negative for claudication and leg swelling.  Gastrointestinal: Positive for abdominal pain, diarrhea, nausea and vomiting.  Musculoskeletal: Positive for back pain.    Blood pressure (!) 133/98, pulse 91, temperature 99.2 F (37.3 C), temperature source Oral, resp. rate (!) 30, SpO2 98 %. Physical Exam  Constitutional: She is oriented to person, place, and time.  HENT:  Head: Normocephalic and atraumatic.  Eyes: Pupils are equal, round, and reactive to light. Conjunctivae are normal. Left eye exhibits no discharge. No scleral icterus.  Neck: Normal range of motion. Neck supple. No JVD present. No tracheal deviation present. No thyromegaly present.  Cardiovascular: Normal rate and  regular rhythm.  Murmur (2/6 systolic murmur noted) heard. Respiratory:  Decreased breath sound at bases with right lung field rhonchi noted  GI: Soft. Bowel sounds are normal. She exhibits distension. There is tenderness. There is no rebound and no guarding.  Musculoskeletal:  She exhibits no edema, tenderness or deformity.  Neurological: She is alert and oriented to person, place, and time.     Assessment/Plan Atypical chest pain Severe ischemic/nonischemic dilated cardiomyopathy  Minimally elevated troponin I secondary to above/renal insufficiency Probable right lung pneumonia Coronary artery disease history of inferior wall MI in the past History of V. Fib arrest in the past status post ICD in the past and ICD discharges in the past Valvular heart disease Severe mitral regurgitation Hypertension Diabetes mellitus Hyperlipidemia Hypothyroidism Acute on Chronic kidney disease stage III Dehydration Plan As per orders Will reduce IV fluids to 50 mL per hour in view of severely depressed LV systolic function    Charolette Forward, MD 04/07/2018, 7:15 PM

## 2018-04-07 NOTE — ED Provider Notes (Signed)
[facility] EMERGENCY DEPARTMENT Provider Note   CSN: [id number] Arrival date & time: 04/07/18  1335   History   Chief Complaint Chief Complaint  Patient presents with  . Back Pain    HPI Crystal Duran is a 72 y.o. female.  HPI Patient is a 72 year old female with history of ischemic cardiomyopathy, prior V. fib arrest with biventricular ICD, CAD, CKD, & DM who presents to ED for evaluation of diffuse low back pain and chest tightness. Reports for past 3 days she has been feeling ill. Abdominal bloating epigastric region. Sharp pains in low back radiating down right leg. No vomiting. Has had diarrhea. Last BM yesterday morning was small volume. Passing gas as recently as this morning. No history abdominal surgeries. Also with decreased urine output. Saw PCP today who advised her to come to ED due to concerns for possible renal dysfunction. CP present for a couple days as well. Tightness. No signficant SOA. States about 3wks ago her defibrillator fired. Saw her cardiologist after this and had device interrogated. Has not had any concerns since then until now.   Past Medical History:  Diagnosis Date  . AICD (automatic cardioverter/defibrillator) present    St. Jude Fortify ICD [phone]  . Arthritis    "hands, back" (04/07/2018)  . CAP (community acquired pneumonia) 04/07/2018  . 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 CHF (congestive heart failure) (HCC)   . Chronic lower back pain   . CKD (chronic kidney disease), stage III (HCC)   . History of tobacco abuse   . HTN (hypertension)   . Hypercholesteremia   . Hypothyroidism   . Ischemic cardiomyopathy    severe  . MI (myocardial infarction) (HCC) 02/2009   Hx of inferior wall MI   . On home oxygen therapy    "2-4L prn" (04/07/2018)  . Pneumonia    "once before today" (04/07/2018)  . Systolic heart failure    compensated  . Type 2 diabetes mellitus [facility])     Patient Active Problem List    Diagnosis Date Noted  . Community acquired pneumonia 04/07/2018  . Hypokalemia 07/15/2017  . Acute kidney injury superimposed on chronic kidney disease (HCC) 07/13/2017  . Hypothyroidism 07/12/2017  . Mild protein-calorie malnutrition (HCC) 07/12/2017  . Diabetes mellitus type 2 in nonobese (HCC) 07/12/2017  . CHF exacerbation (HCC) 07/11/2017  . Acute systolic congestive heart failure (HCC) 06/25/2016  . aborted cardiac arrest 03/26/2011  . Chronic systolic congestive heart failure (HCC) 03/29/2011  . HYPERTENSION, BENIGN 06/01/2010  . CAD 06/01/2010  . CARDIOMYOPATHY, ISCHEMIC 06/01/2010  . IMPLANTATION OF DEFIBRILLATOR,ST. JUDE FORTIFY ICD 123-40Q 06/01/2010    Past Surgical History:  Procedure Laterality Date  . ABDOMINAL HYSTERECTOMY    . CARDIAC DEFIBRILLATOR PLACEMENT  2011   single chamber. St Jude Fortify ICD 102-72Z. Remote- no.   . CORONARY ANGIOPLASTY WITH STENT PLACEMENT  02/2009   [name] 02/07/2010     OB History   None      Home Medications    Prior to Admission medications   Medication Sig Start Date End Date Taking? Authorizing Provider  aspirin EC 81 MG tablet Take 81 mg by mouth daily.   Yes [provider]  carvedilol (COREG) 25 MG tablet Take 0.5 tablets (12.5 mg total) by mouth 2 (two) times daily. 06/29/16  Yes Rinaldo Cloud, MD  furosemide (LASIX) 40 MG tablet Take 40 mg by mouth 2 (two) times daily.     Yes  [provider]  isosorbide-hydrALAZINE (BIDIL) 20-37.5 MG tablet Take 1 tablet by mouth 2 (two) times daily. 07/26/17  Yes Duke SalviaKlein, Steven C, MD  levothyroxine (SYNTHROID, LEVOTHROID) 175 MCG tablet Take 175 mcg by mouth daily before breakfast. 03/27/18  Yes [provider]  metFORMIN (GLUCOPHAGE-XR) 500 MG 24 hr tablet Take 250 mg by mouth 2 (two) times daily with a meal.  06/15/17  Yes [provider]  nitroGLYCERIN (NITROSTAT) 0.4 MG SL tablet Place 1 tablet (0.4 mg total) under the tongue every 5 (five)  minutes x 3 doses as needed for chest pain. 06/29/16  Yes Rinaldo CloudHarwani, Mohan, MD  OXYGEN Inhale 2 L into the lungs continuous.   Yes [provider]  rosuvastatin (CRESTOR) 20 MG tablet Take 20 mg by mouth daily. 02/10/18  Yes [provider]  sacubitril-valsartan (ENTRESTO) 97-103 MG Take 1 tablet by mouth 2 (two) times daily.   Yes [provider]  spironolactone (ALDACTONE) 25 MG tablet Take 25 mg by mouth daily. 02/10/18  Yes [provider]  levothyroxine (SYNTHROID, LEVOTHROID) 150 MCG tablet Take 1 tablet (150 mcg total) daily by mouth. Patient not taking: Reported on 04/07/2018 07/15/17   Rama, Maryruth Bunhristina P, MD    Family History Family History  Problem Relation Age of Onset  . Coronary artery disease Other        postive family Hx    Social History Social History   Tobacco Use  . Smoking status: Former Smoker    Packs/day: 0.50    Years: 5.00    Pack years: 2.50    Types: Cigarettes    Last attempt to quit: 1995    Years since quitting: 24.6  . Smokeless tobacco: Never Used  Substance Use Topics  . Alcohol use: Not Currently  . Drug use: Never     Allergies   Eggs or egg-derived products   Review of Systems Review of Systems  Constitutional: Positive for fatigue. Negative for chills and fever.  HENT: Negative for congestion and sore throat.   Eyes: Negative for visual disturbance.  Respiratory: Positive for cough and shortness of breath.   Cardiovascular: Positive for chest pain. Negative for palpitations and leg swelling.  Gastrointestinal: Negative for abdominal pain, diarrhea, nausea and vomiting.  Genitourinary: Negative for dysuria and hematuria.       Decreased UOP  Musculoskeletal: Positive for back pain. Negative for neck pain.  Skin: Negative for color change and rash.       Dry skin  Neurological: Negative for weakness and headaches.  All other systems reviewed and are negative.    Physical Exam Updated Vital  Signs BP 132/81 (BP Location: Left Arm)   Pulse 86   Temp 97.9 F (36.6 C) (Oral)   Resp 16   SpO2 100%   Physical Exam  Constitutional: She appears well-developed and well-nourished. No distress.  elderly  HENT:  Head: Normocephalic and atraumatic.  Dry MM  Eyes: Conjunctivae are normal.  Neck: Neck supple.  Cardiovascular: Regular rhythm and intact distal pulses.  Pulmonary/Chest: Effort normal and breath sounds normal. No respiratory distress.  Abdominal: Soft. She exhibits distension (epigastric). There is tenderness (epigastric).  Musculoskeletal: She exhibits no edema.  Neurological: She is alert.  Skin: Skin is warm and dry. Capillary refill takes less than 2 seconds.  Dry scaling skin  Psychiatric: She has a normal mood and affect.  Nursing note and vitals reviewed.    ED Treatments / Results  Labs (all labs ordered are listed,  but only abnormal results are displayed) Labs Reviewed  BASIC METABOLIC PANEL - Abnormal; Notable for the following components:      Result Value   CO2 20 (*)    Glucose, Bld 159 (*)    BUN 27 (*)    Creatinine, Ser 1.80 (*)    Calcium 8.8 (*)    GFR calc non Af Amer 27 (*)    GFR calc Af Amer 31 (*)    Anion gap 16 (*)    All other components within normal limits  CBC - Abnormal; Notable for the following components:   Hemoglobin 11.6 (*)    RDW 16.1 (*)    All other components within normal limits  HEPATIC FUNCTION PANEL - Abnormal; Notable for the following components:   Bilirubin, Direct 0.3 (*)    All other components within normal limits  TROPONIN I - Abnormal; Notable for the following components:   Troponin I 0.18 (*)    All other components within normal limits  BRAIN NATRIURETIC PEPTIDE - Abnormal; Notable for the following components:   B Natriuretic Peptide >4,500.0 (*)    All other components within normal limits  GLUCOSE, CAPILLARY - Abnormal; Notable for the following components:   Glucose-Capillary 139 (*)     All other components within normal limits  TROPONIN I - Abnormal; Notable for the following components:   Troponin I 0.20 (*)    All other components within normal limits  HEMOGLOBIN A1C - Abnormal; Notable for the following components:   Hgb A1c MFr Bld 5.9 (*)    All other components within normal limits  TSH - Abnormal; Notable for the following components:   TSH 0.244 (*)    All other components within normal limits  I-STAT TROPONIN, ED - Abnormal; Notable for the following components:   Troponin i, poc 0.20 (*)    All other components within normal limits  I-STAT CG4 LACTIC ACID, ED - Abnormal; Notable for the following components:   Lactic Acid, Venous 2.24 (*)    All other components within normal limits  I-STAT TROPONIN, ED - Abnormal; Notable for the following components:   Troponin i, poc 0.18 (*)    All other components within normal limits  CULTURE, BLOOD (ROUTINE X 2)  CULTURE, BLOOD (ROUTINE X 2)  EXPECTORATED SPUTUM ASSESSMENT W REFEX TO RESP CULTURE  GRAM STAIN  LIPASE, BLOOD  URINALYSIS, ROUTINE W REFLEX MICROSCOPIC  BASIC METABOLIC PANEL  CBC  HIV ANTIBODY (ROUTINE TESTING)  STREP PNEUMONIAE URINARY ANTIGEN  TROPONIN I  TROPONIN I  I-STAT CG4 LACTIC ACID, ED    EKG EKG Interpretation  Date/Time:  Friday April 07 2018 13:42:17 EDT Ventricular Rate:  95 PR Interval:  182 QRS Duration: 156 QT Interval:  438 QTC Calculation: 550 R Axis:   -62 Text Interpretation:  Normal sinus rhythm Left axis deviation Left bundle branch block Abnormal ECG Confirmed by Raeford Razor [phone]) on 04/07/2018 2:34:18 PM   Radiology Ct Abdomen Pelvis Wo Contrast  Result Date: 04/07/2018 CLINICAL DATA:  Abdominal pain and low back pain. Difficulty swallowing. EXAM: CT ABDOMEN AND PELVIS WITHOUT CONTRAST TECHNIQUE: Multidetector CT imaging of the abdomen and pelvis was performed following the standard protocol without IV contrast. COMPARISON:  CT scan of the pelvis dated  08/02/2005 FINDINGS: Lower chest: Moderate right and small left pleural effusions. Cardiomegaly. AICD in place. Slight peribronchial thickening at the lung bases. Hepatobiliary: No focal liver abnormality is seen. No gallstones, gallbladder wall thickening, or biliary dilatation. Pancreas:  Atrophy of the pancreatic body and tail. Scattered calcifications around the pancreatic head are most likely vascular in origin. Spleen: Normal in size without focal abnormality. Adrenals/Urinary Tract: Adrenal glands are unremarkable. Kidneys are normal, without renal calculi, focal lesion, or hydronephrosis. Bladder is unremarkable. Stomach/Bowel: Stomach is within normal limits. Appendix appears normal. No evidence of bowel wall thickening, distention, or inflammatory changes. Vascular/Lymphatic: Aortic atherosclerosis. No enlarged abdominal or pelvic lymph nodes. Reproductive: Status post hysterectomy. No adnexal masses. Other: Small amount of ascites in the pelvic cul-de-sac and in the pericolic gutters. Diffuse anasarca. No free air. Musculoskeletal: No acute abnormality. Degenerative disc disease at L4-5 and L5-S1. 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 Slight arthritic changes of the right hip joint. IMPRESSION: 1. Small amount of ascites with diffuse anasarca and bilateral pleural effusions, right greater than left. 2. Cardiomegaly. 3.  Aortic Atherosclerosis (ICD10-I70.0). Electronically Signed   By: Francene Boyers M.D.   On: 04/07/2018 17:21   Dg Chest 2 View  Result Date: 04/07/2018 CLINICAL DATA:  Chest tightness and low back pain with ongoing cough and myalgias. EXAM: CHEST - 2 VIEW COMPARISON:  07/11/2017. FINDINGS: Stable enlarged cardiac silhouette. Stable left subclavian AICD lead. Decreased depth of inspiration. Interval mild patchy opacity in the right mid and lower lung zone and minimal patchy opacity in the left lower lung zone. The interstitial markings  are less prominent. The pulmonary vasculature remains prominent. Small right pleural effusion. Unremarkable bones. IMPRESSION: 1. Mild patchy opacity in the right mid and lower lung zone and minimal patchy opacity in the left lower lung zone, suspicious for pneumonia. 2. Interval small right pleural effusion. 3. Resolved interstitial pulmonary edema. 4. Stable cardiomegaly and pulmonary vascular congestion. Electronically Signed   By: Beckie Salts M.D.   On: 04/07/2018 14:55    Procedures Procedures (including critical care time)  Medications Ordered in ED Medications  carvedilol (COREG) tablet 12.5 mg (12.5 mg Oral Given 04/07/18 2323)  isosorbide-hydrALAZINE (BIDIL) 20-37.5 MG per tablet 1 tablet (1 tablet Oral Given 04/07/18 2323)  nitroGLYCERIN (NITROSTAT) SL tablet 0.4 mg (has no administration in time range)  rosuvastatin (CRESTOR) tablet 20 mg (20 mg Oral Given 04/07/18 2332)  levothyroxine (SYNTHROID, LEVOTHROID) tablet 175 mcg (has no administration in time range)  aspirin chewable tablet 324 mg (324 mg Oral Not Given 04/07/18 2320)    Or  aspirin suppository 300 mg ( Rectal See Alternative 04/07/18 2320)  aspirin EC tablet 81 mg (has no administration in time range)  acetaminophen (TYLENOL) tablet 650 mg (has no administration in time range)  ondansetron (ZOFRAN) injection 4 mg (has no administration in time range)  heparin injection 5,000 Units (5,000 Units Subcutaneous Given 04/07/18 2322)  0.9 %  sodium chloride infusion ( Intravenous New Bag/Given 04/07/18 2356)  insulin aspart (novoLOG) injection 0-9 Units (has no administration in time range)  levalbuterol (XOPENEX) nebulizer solution 1.25 mg (1.25 mg Nebulization Given 04/07/18 2203)  feeding supplement (ENSURE ENLIVE) (ENSURE ENLIVE) liquid 237 mL (has no administration in time range)  azithromycin (ZITHROMAX) 500 mg in sodium chloride 0.9 % 250 mL IVPB (has no administration in time range)  cefTRIAXone (ROCEPHIN) 1 g in sodium chloride  0.9 % 100 mL IVPB (has no administration in time range)  fentaNYL (SUBLIMAZE) injection 50 mcg (50 mcg Intravenous Given 04/07/18 1513)  cefTRIAXone (ROCEPHIN) 1 g in sodium chloride 0.9 % 100 mL IVPB (0 g Intravenous Stopped 04/07/18 1825)  azithromycin (ZITHROMAX)  500 mg in sodium chloride 0.9 % 250 mL IVPB (0 mg Intravenous Stopped 04/07/18 1956)  lactated ringers bolus 500 mL (0 mLs Intravenous Stopped 04/07/18 1959)  aspirin chewable tablet 324 mg (324 mg Oral Given 04/07/18 1857)     Initial Impression / Assessment and Plan / ED Course  I have reviewed the triage vital signs and the nursing notes.  Pertinent labs & imaging results that were available during my care of the patient were reviewed by me and considered in my medical decision making (see chart for details).    Patient is a 72 year old female with history of ischemic cardiomyopathy, prior V. fib arrest with biventricular ICD, CAD, CKD, & DM who presents to ED for evaluation of diffuse low back pain and chest tightness, fatigue, and diarrhea.   Pt afebrile, HDS. ECG reviewed with rate approximately 95, sinus rhythm with PACs, left axis deviation, left bundle branch block, does not meet Sgarbossa. No significant change when compared to prior ECG.  Given abdominal pain and low back pain CT abdomen/pelvis obtained shows no acute process.  Does have small volume ascites.  No evidence of obstruction. Back pain not consistent with dissection or aneurysm. symetric DP pulses. Will evaluate urine for UTI/pyelo. Does have findings concerning for pneumonia on chest x-ray.  Antibiotics initiated.  Also with elevated troponin with ECG as detailed above.  Aspirin given.  Will await delta troponin to determine if anticoagulation is necessary.  May be secondary to dehydration/demand ischemia in the setting of dilated cardiomyopathy. Given dehydration, elevated troponin, and pneumonia in medically complex patient, she was admitted for further management.    Case and plan of care discussed with Dr. Criss Alvine.   Final Clinical Impressions(s) / ED Diagnoses   Final diagnoses:  Community acquired pneumonia of right lower lobe of lung (HCC)  Other ascites  Abdominal pain, generalized  Dehydration  Elevated lactic acid level    ED Discharge Orders    None       Rigoberto Noel, MD 04/08/18 1610    Pricilla Loveless, MD 04/08/18 1900

## 2018-04-07 NOTE — ED Triage Notes (Signed)
Pt in c/o lower back pain and generalized body aches, MD concerned for renal failure and sent patient over, also c/o difficulty urinating

## 2018-04-07 NOTE — ED Notes (Signed)
IV to be placed by CT personnel w/ IV cath vendor.

## 2018-04-08 LAB — CBC
HEMATOCRIT: 33 % — AB (ref 36.0–46.0)
Hemoglobin: 10.5 g/dL — ABNORMAL LOW (ref 12.0–15.0)
MCH: 29 pg (ref 26.0–34.0)
MCHC: 31.8 g/dL (ref 30.0–36.0)
MCV: 91.2 fL (ref 78.0–100.0)
PLATELETS: 141 10*3/uL — AB (ref 150–400)
RBC: 3.62 MIL/uL — ABNORMAL LOW (ref 3.87–5.11)
RDW: 16.3 % — AB (ref 11.5–15.5)
WBC: 5.2 10*3/uL (ref 4.0–10.5)

## 2018-04-08 LAB — TSH: TSH: 0.244 u[IU]/mL — ABNORMAL LOW (ref 0.350–4.500)

## 2018-04-08 LAB — URINALYSIS, ROUTINE W REFLEX MICROSCOPIC
Bilirubin Urine: NEGATIVE
Glucose, UA: NEGATIVE mg/dL
Hgb urine dipstick: NEGATIVE
Ketones, ur: NEGATIVE mg/dL
Nitrite: NEGATIVE
Protein, ur: 30 mg/dL — AB
Specific Gravity, Urine: 1.017 (ref 1.005–1.030)
pH: 5 (ref 5.0–8.0)

## 2018-04-08 LAB — BASIC METABOLIC PANEL
Anion gap: 12 (ref 5–15)
BUN: 28 mg/dL — AB (ref 8–23)
CALCIUM: 8.3 mg/dL — AB (ref 8.9–10.3)
CO2: 23 mmol/L (ref 22–32)
CREATININE: 1.83 mg/dL — AB (ref 0.44–1.00)
Chloride: 106 mmol/L (ref 98–111)
GFR calc Af Amer: 30 mL/min — ABNORMAL LOW (ref >60)
GFR, EST NON AFRICAN AMERICAN: 26 mL/min — AB (ref >60)
GLUCOSE: 128 mg/dL — AB (ref 70–99)
POTASSIUM: 3.1 mmol/L — AB (ref 3.5–5.1)
SODIUM: 141 mmol/L (ref 135–145)

## 2018-04-08 LAB — TROPONIN I
TROPONIN I: 0.2 ng/mL — AB (ref <0.03)
Troponin I: 0.17 ng/mL (ref <0.03)
Troponin I: 0.17 ng/mL (ref <0.03)

## 2018-04-08 LAB — STREP PNEUMONIAE URINARY ANTIGEN: STREP PNEUMO URINARY ANTIGEN: NEGATIVE

## 2018-04-08 LAB — GLUCOSE, CAPILLARY
GLUCOSE-CAPILLARY: 179 mg/dL — AB (ref 70–99)
Glucose-Capillary: 108 mg/dL — ABNORMAL HIGH (ref 70–99)
Glucose-Capillary: 152 mg/dL — ABNORMAL HIGH (ref 70–99)
Glucose-Capillary: 110 mg/dL — ABNORMAL HIGH (ref 70–99)
Glucose-Capillary: 184 mg/dL — ABNORMAL HIGH (ref 70–99)

## 2018-04-08 LAB — HIV ANTIBODY (ROUTINE TESTING W REFLEX): HIV Screen 4th Generation wRfx: NONREACTIVE

## 2018-04-08 MED ORDER — SACUBITRIL-VALSARTAN 24-26 MG PO TABS
1.0000 | ORAL_TABLET | Freq: Two times a day (BID) | ORAL | Status: DC
  Administered 2018-04-08 – 2018-04-09 (×3): 1 via ORAL
  Filled 2018-04-08 (×3): qty 1

## 2018-04-08 MED ORDER — INSULIN ASPART 100 UNIT/ML ~~LOC~~ SOLN
0.0000 [IU] | Freq: Three times a day (TID) | SUBCUTANEOUS | Status: DC
  Administered 2018-04-09 – 2018-04-11 (×4): 2 [IU] via SUBCUTANEOUS

## 2018-04-08 MED ORDER — POTASSIUM CHLORIDE CRYS ER 20 MEQ PO TBCR
40.0000 meq | EXTENDED_RELEASE_TABLET | Freq: Once | ORAL | Status: AC
  Administered 2018-04-08: 40 meq via ORAL
  Filled 2018-04-08: qty 2

## 2018-04-08 NOTE — Progress Notes (Signed)
Pt is getting up and using the bathroom, UA been sent, IV fluid continue @50cc /hr, IVABX continue, pt is taking her ensure, will continue to monitor  Lonia Farberekha, RN

## 2018-04-08 NOTE — Plan of Care (Signed)
?  Problem: Activity: ?Goal: Capacity to carry out activities will improve ?Outcome: Progressing ?  ?

## 2018-04-08 NOTE — Progress Notes (Signed)
Subjective:  Patient denies any chest pain states coughing and breathing has improved. No further nausea vomiting or diarrhea. States started voiding now.  Objective:  Vital Signs in the last 24 hours: Temp:  [97.4 F (36.3 C)-99.2 F (37.3 C)] 97.8 F (36.6 C) (08/10 0804) Pulse Rate:  [78-96] 83 (08/10 0804) Resp:  [16-30] 18 (08/10 0804) BP: (110-143)/(69-99) 143/76 (08/10 0804) SpO2:  [97 %-100 %] 100 % (08/10 0804) Weight:  [60.7 kg] 60.7 kg (08/10 0021)  Intake/Output from previous day: 08/09 0701 - 08/10 0700 In: 240 [P.O.:240] Out: 100 [Urine:100] Intake/Output from this shift: No intake/output data recorded.  Physical Exam: Neck: no adenopathy, no carotid bruit, no JVD and supple, symmetrical, trachea midline Lungs: Ddecreased breath sound at bases with right lung rhonchi noted Heart: regular rate and rhythm, S1, S2 normal and 2/6 systolic murmur noted Abdomen: soft, non-tender; bowel sounds normal; no masses,  no organomegaly Extremities: extremities normal, atraumatic, no cyanosis or edema  Lab Results: Recent Labs    04/07/18 1416 04/08/18 0337  WBC 5.0 5.2  HGB 11.6* 10.5*  PLT 163 141*   Recent Labs    04/07/18 1416 04/08/18 0337  NA 142 141  K 3.5 3.1*  CL 106 106  CO2 20* 23  GLUCOSE 159* 128*  BUN 27* 28*  CREATININE 1.80* 1.83*   Recent Labs    04/08/18 0337 04/08/18 0908  TROPONINI 0.17* 0.17*   Hepatic Function Panel Recent Labs    04/07/18 1624  PROT 7.5  ALBUMIN 3.5  AST 26  ALT 21  ALKPHOS 64  BILITOT 1.2  BILIDIR 0.3*  IBILI 0.9   No results for input(s): CHOL in the last 72 hours. No results for input(s): PROTIME in the last 72 hours.  Imaging: Imaging results have been reviewed and Ct Abdomen Pelvis Wo Contrast  Result Date: 04/07/2018 CLINICAL DATA:  Abdominal pain and low back pain. Difficulty swallowing. EXAM: CT ABDOMEN AND PELVIS WITHOUT CONTRAST TECHNIQUE: Multidetector CT imaging of the abdomen and pelvis was  performed following the standard protocol without IV contrast. COMPARISON:  CT scan of the pelvis dated 08/02/2005 FINDINGS: Lower chest: Moderate right and small left pleural effusions. Cardiomegaly. AICD in place. Slight peribronchial thickening at the lung bases. Hepatobiliary: No focal liver abnormality is seen. No gallstones, gallbladder wall thickening, or biliary dilatation. Pancreas: Atrophy of the pancreatic body and tail. Scattered calcifications around the pancreatic head are most likely vascular in origin. Spleen: Normal in size without focal abnormality. Adrenals/Urinary Tract: Adrenal glands are unremarkable. Kidneys are normal, without renal calculi, focal lesion, or hydronephrosis. Bladder is unremarkable. Stomach/Bowel: Stomach is within normal limits. Appendix appears normal. No evidence of bowel wall thickening, distention, or inflammatory changes. Vascular/Lymphatic: Aortic atherosclerosis. No enlarged abdominal or pelvic lymph nodes. Reproductive: Status post hysterectomy. No adnexal masses. Other: Small amount of ascites in the pelvic cul-de-sac and in the pericolic gutters. Diffuse anasarca. No free air. Musculoskeletal: No acute abnormality. Degenerative disc disease at L4-5 and L5-S1. 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 Slight arthritic changes of the right hip joint. IMPRESSION: 1. Small amount of ascites with diffuse anasarca and bilateral pleural effusions, right greater than left. 2. Cardiomegaly. 3.  Aortic Atherosclerosis (ICD10-I70.0). Electronically Signed   By: Francene BoyersJames  Maxwell M.D.   On: 04/07/2018 17:21   Dg Chest 2 View  Result Date: 04/07/2018 CLINICAL DATA:  Chest tightness and low back pain with ongoing cough and myalgias. EXAM:  CHEST - 2 VIEW COMPARISON:  07/11/2017. FINDINGS: Stable enlarged cardiac silhouette. Stable left subclavian AICD lead. Decreased depth of inspiration. Interval mild patchy opacity in the right mid  and lower lung zone and minimal patchy opacity in the left lower lung zone. The interstitial markings are less prominent. The pulmonary vasculature remains prominent. Small right pleural effusion. Unremarkable bones. IMPRESSION: 1. Mild patchy opacity in the right mid and lower lung zone and minimal patchy opacity in the left lower lung zone, suspicious for pneumonia. 2. Interval small right pleural effusion. 3. Resolved interstitial pulmonary edema. 4. Stable cardiomegaly and pulmonary vascular congestion. Electronically Signed   By: Beckie Salts M.D.   On: 04/07/2018 14:55    Cardiac Studies:  Assessment/Plan:  Status post Atypical chest pain MI ruled out Severe ischemic/nonischemic dilated cardiomyopathy  Minimally elevated troponin I secondary to above/renal insufficiency resolving right lung pneumonia Coronary artery disease history of inferior wall MI in the past History of V. Fib arrest in the past status post ICD in the past and ICD discharges in the past Valvular heart disease Severe mitral regurgitation Hypertension Diabetes mellitus Hyperlipidemia Hypothyroidism Acute on Chronic kidney disease stage III Plan Replace K Restart low-dose Entresto as per orders  check labs in a.m.  LOS: 1 day    Rinaldo Cloud 04/08/2018, 10:39 AM

## 2018-04-08 NOTE — Progress Notes (Signed)
Initial Nutrition Assessment  DOCUMENTATION CODES:  Non-severe (moderate) malnutrition in context of chronic illness  INTERVENTION:  Continue Ensure Enlive po BID, each supplement provides 350 kcal and 20 grams of protein  Oriented patient on how to use menu.   NUTRITION DIAGNOSIS:  Moderate Malnutrition(Chronic) related to chronic illness(CAD/CHF/DM2/CKD) as evidenced by moderate muscle/fat loss  GOAL:  Patient will meet greater than or equal to 90% of their needs  MONITOR:  PO intake, Supplement acceptance, Labs, Weight trends  REASON FOR ASSESSMENT:  Malnutrition Screening Tool    ASSESSMENT:  72 y/o female PMHx of CAD, htn/hld, MI, dm2, ckd3, CHF. Presents w/ cough, pleuritic chest pain, back pain and abd pain and n/v/d. Work up for likely R Lung PNA. Admitted for management.   Patient reports she started to go downhill about 2 weeks ago when she begun to lose her appetite. She had been consuming an Ensure For Breakfast, skipping lunch and then eating 50% of her Dinner, but consuming an extra ensure. More recently, became unable to tolerate any solid intake and has only been drinking 2-3 Ensures/day. At baseline, she does not take any vitamins. She does follow a lower salt diet, but does not follow a diabetic diet, but says "I dont eat sweets". SHe does not check her sugars. She is only on metformin.   Denies any n/v/c/d. She says she takes a prn laxative at home for occasional constipation, but nothing scheduled.   Weight wise, she has lost some weight. She was 138 last November. She is now 134 lbs. More long term, she was >150 lbs in 2017 and nearly 180 in 2014. Appears to have had progressive wt loss.   At this time, she still reports a depressed appetite. RD oriented her to the menu, which she was unaware existed. Will also continue Ensure supplements.   Labs: A1C:5.9, BUN/Creat:28/1.83, K:3.1, bGS: 110-150 Meds: Ensure Enlive-drinking 100%, Insulin, IVF, IV ABX  Recent  Labs  Lab 04/07/18 1416 04/08/18 0337  NA 142 141  K 3.5 3.1*  CL 106 106  CO2 20* 23  BUN 27* 28*  CREATININE 1.80* 1.83*  CALCIUM 8.8* 8.3*  GLUCOSE 159* 128*   NUTRITION - FOCUSED PHYSICAL EXAM:   Most Recent Value  Orbital Region  Mild depletion  Upper Arm Region  Moderate depletion  Thoracic and Lumbar Region  Moderate depletion  Buccal Region  Moderate depletion  Temple Region  Mild depletion  Clavicle Bone Region  Mild depletion  Clavicle and Acromion Bone Region  Mild depletion  Scapular Bone Region  Unable to assess  Dorsal Hand  Mild depletion  Patellar Region  Moderate depletion  Anterior Thigh Region  Moderate depletion  Posterior Calf Region  Moderate depletion  Edema (RD Assessment)  None  Hair  Reviewed  Eyes  Reviewed  Mouth  Reviewed  Skin  Reviewed  Nails  Reviewed     Diet Order:   Diet Order            Diet heart healthy/carb modified Room service appropriate? Yes; Fluid consistency: Thin  Diet effective now             EDUCATION  Education needs have been addressed  Skin:  Skin Assessment: Reviewed RN Assessment  Last BM:  8/9  Height:  Ht Readings from Last 1 Encounters:  04/08/18 5' 4.5" (1.638 m)   Weight:  Wt Readings from Last 1 Encounters:  04/08/18 60.7 kg   Wt Readings from Last 10 Encounters:  04/08/18  60.7 kg  07/26/17 62.6 kg  07/15/17 63.8 kg  06/29/16 70.1 kg  05/13/2012 81.1 kg  03/26/2011 83.5 kg   Ideal Body Weight:  55.68 kg  BMI:  Body mass index is 22.61 kg/m.  Estimated Nutritional Needs:  Kcal:  1700-1900 (28-31 kcal/kg bw) Protein:  79-91g Pro (1.3-1.5 g/kg bw) Fluid:  >1.8 L fluid (72ml/kcal)  Christophe Louis RD, LDN, CNSC Clinical Nutrition Available Tues-Sat via Pager: [phone] 04/08/2018 5:38 PM

## 2018-04-09 ENCOUNTER — Other Ambulatory Visit: Payer: Self-pay

## 2018-04-09 DIAGNOSIS — E440 Moderate protein-calorie malnutrition: Secondary | ICD-10-CM

## 2018-04-09 LAB — BASIC METABOLIC PANEL
Anion gap: 11 (ref 5–15)
BUN: 29 mg/dL — AB (ref 8–23)
CALCIUM: 8.4 mg/dL — AB (ref 8.9–10.3)
CHLORIDE: 107 mmol/L (ref 98–111)
CO2: 23 mmol/L (ref 22–32)
CREATININE: 1.77 mg/dL — AB (ref 0.44–1.00)
GFR calc Af Amer: 32 mL/min — ABNORMAL LOW (ref >60)
GFR calc non Af Amer: 27 mL/min — ABNORMAL LOW (ref >60)
Glucose, Bld: 109 mg/dL — ABNORMAL HIGH (ref 70–99)
Potassium: 3.6 mmol/L (ref 3.5–5.1)
SODIUM: 141 mmol/L (ref 135–145)

## 2018-04-09 LAB — GLUCOSE, CAPILLARY
GLUCOSE-CAPILLARY: 154 mg/dL — AB (ref 70–99)
Glucose-Capillary: 155 mg/dL — ABNORMAL HIGH (ref 70–99)
Glucose-Capillary: 93 mg/dL (ref 70–99)
Glucose-Capillary: 89 mg/dL (ref 70–99)

## 2018-04-09 LAB — MAGNESIUM: MAGNESIUM: 2.4 mg/dL (ref 1.7–2.4)

## 2018-04-09 MED ORDER — SACUBITRIL-VALSARTAN 49-51 MG PO TABS
1.0000 | ORAL_TABLET | Freq: Two times a day (BID) | ORAL | Status: DC
Start: 2018-04-09 — End: 2018-04-11
  Administered 2018-04-09 – 2018-04-11 (×4): 1 via ORAL
  Filled 2018-04-09 (×5): qty 1

## 2018-04-09 MED ORDER — LEVALBUTEROL HCL 1.25 MG/0.5ML IN NEBU
1.2500 mg | INHALATION_SOLUTION | Freq: Four times a day (QID) | RESPIRATORY_TRACT | Status: DC | PRN
  Administered 2018-04-10: 1.25 mg via RESPIRATORY_TRACT
  Filled 2018-04-09 (×2): qty 0.5

## 2018-04-09 NOTE — Progress Notes (Signed)
Nurse saw eggs to her dinner plate and pt is allergic to eggs in her chart,informed to kitchen and modified diet order per verbal order of MD  Lonia Farberekha, RN

## 2018-04-09 NOTE — Progress Notes (Signed)
Subjective:  Patient denies any chest pain or shortness of breath states cough has markedly improved . Urine output improved renal function remained stable. Had few beats of nonsustained V. Tach on the monitor asymptomatic  Objective:  Vital Signs in the last 24 hours: Temp:  [97.3 F (36.3 C)-98.9 F (37.2 C)] 98.3 F (36.8 C) (08/11 0418) Pulse Rate:  [84-96] 86 (08/11 0418) Resp:  [16-20] 18 (08/11 0723) BP: (109-119)/(60-74) 110/60 (08/11 0723) SpO2:  [99 %-100 %] 100 % (08/11 0723) Weight:  [63.1 kg] 63.1 kg (08/11 0300)  Intake/Output from previous day: 08/10 0701 - 08/11 0700 In: 2755.1 [P.O.:1320; I.V.:978.8; IV Piggyback:456.3] Out: 700 [Urine:700] Intake/Output from this shift: Total I/O In: 240 [P.O.:240] Out: -   Physical Exam: Neck: no adenopathy, no carotid bruit, no JVD and supple, symmetrical, trachea midline Lungs: Decreased breath sound at bases with occasional right lung rhonchi air entry improved Heart: regular rate and rhythm, S1, S2 normal and 2/6 systolic murmur noted Abdomen: soft, non-tender; bowel sounds normal; no masses,  no organomegaly Extremities: extremities normal, atraumatic, no cyanosis or edema  Lab Results: Recent Labs    04/07/18 1416 04/08/18 0337  WBC 5.0 5.2  HGB 11.6* 10.5*  PLT 163 141*   Recent Labs    04/08/18 0337 04/09/18 0546  NA 141 141  K 3.1* 3.6  CL 106 107  CO2 23 23  GLUCOSE 128* 109*  BUN 28* 29*  CREATININE 1.83* 1.77*   Recent Labs    04/08/18 0337 04/08/18 0908  TROPONINI 0.17* 0.17*   Hepatic Function Panel Recent Labs    04/07/18 1624  PROT 7.5  ALBUMIN 3.5  AST 26  ALT 21  ALKPHOS 64  BILITOT 1.2  BILIDIR 0.3*  IBILI 0.9   No results for input(s): CHOL in the last 72 hours. No results for input(s): PROTIME in the last 72 hours.  Imaging: Imaging results have been reviewed and Ct Abdomen Pelvis Wo Contrast  Result Date: 04/07/2018 CLINICAL DATA:  Abdominal pain and low back pain.  Difficulty swallowing. EXAM: CT ABDOMEN AND PELVIS WITHOUT CONTRAST TECHNIQUE: Multidetector CT imaging of the abdomen and pelvis was performed following the standard protocol without IV contrast. COMPARISON:  CT scan of the pelvis dated 08/02/2005 FINDINGS: Lower chest: Moderate right and small left pleural effusions. Cardiomegaly. AICD in place. Slight peribronchial thickening at the lung bases. Hepatobiliary: No focal liver abnormality is seen. No gallstones, gallbladder wall thickening, or biliary dilatation. Pancreas: Atrophy of the pancreatic body and tail. Scattered calcifications around the pancreatic head are most likely vascular in origin. Spleen: Normal in size without focal abnormality. Adrenals/Urinary Tract: Adrenal glands are unremarkable. Kidneys are normal, without renal calculi, focal lesion, or hydronephrosis. Bladder is unremarkable. Stomach/Bowel: Stomach is within normal limits. Appendix appears normal. No evidence of bowel wall thickening, distention, or inflammatory changes. Vascular/Lymphatic: Aortic atherosclerosis. No enlarged abdominal or pelvic lymph nodes. Reproductive: Status post hysterectomy. No adnexal masses. Other: Small amount of ascites in the pelvic cul-de-sac and in the pericolic gutters. Diffuse anasarca. No free air. Musculoskeletal: No acute abnormality. Degenerative disc disease at L4-5 and L5-S1. 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 Slight arthritic changes of the right hip joint. IMPRESSION: 1. Small amount of ascites with diffuse anasarca and bilateral pleural effusions, right greater than left. 2. Cardiomegaly. 3.  Aortic Atherosclerosis (ICD10-I70.0). Electronically Signed   By: Francene BoyersJames  Maxwell M.D.   On: 04/07/2018 17:21   Dg  Chest 2 View  Result Date: 04/07/2018 CLINICAL DATA:  Chest tightness and low back pain with ongoing cough and myalgias. EXAM: CHEST - 2 VIEW COMPARISON:  07/11/2017. FINDINGS: Stable  enlarged cardiac silhouette. Stable left subclavian AICD lead. Decreased depth of inspiration. Interval mild patchy opacity in the right mid and lower lung zone and minimal patchy opacity in the left lower lung zone. The interstitial markings are less prominent. The pulmonary vasculature remains prominent. Small right pleural effusion. Unremarkable bones. IMPRESSION: 1. Mild patchy opacity in the right mid and lower lung zone and minimal patchy opacity in the left lower lung zone, suspicious for pneumonia. 2. Interval small right pleural effusion. 3. Resolved interstitial pulmonary edema. 4. Stable cardiomegaly and pulmonary vascular congestion. Electronically Signed   By: Beckie Salts M.D.   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

## 2018-04-09 NOTE — Progress Notes (Signed)
Visited with this patient this evening to provide prayer via Spiritual Care Consult.  Patient is concerned about her breathing, her heart issues and her kidneys.  She loss a son last year right before he was going for a heart transplant, he fell and ended up with a brain bleed.  She is still grieving that loss she says.  She talked and shared as I provided empathic and compassionate presence and listening.  We prayed together and I left her quietly to rest.   Thankful for the medical professionals caring for Crystal Duran.    04/09/18 1951  Clinical Encounter Type  Visited With Patient  Visit Type Initial;Spiritual support;Psychological support  Spiritual Encounters  Spiritual Needs Prayer;Emotional  Stress Factors  Patient Stress Factors Loss;Health changes

## 2018-04-10 LAB — GLUCOSE, CAPILLARY
Glucose-Capillary: 99 mg/dL (ref 70–99)
Glucose-Capillary: 195 mg/dL — ABNORMAL HIGH (ref 70–99)
Glucose-Capillary: 104 mg/dL — ABNORMAL HIGH (ref 70–99)
Glucose-Capillary: 144 mg/dL — ABNORMAL HIGH (ref 70–99)

## 2018-04-10 NOTE — Progress Notes (Signed)
Subjective:  Patient denies any chest pain.  States breathing and coughing has improved  Objective:  Vital Signs in the last 24 hours: Temp:  [97.6 F (36.4 C)-98.5 F (36.9 C)] 98.2 F (36.8 C) (08/12 1131) Pulse Rate:  [78-86] 78 (08/12 1131) Resp:  [18-22] 18 (08/12 1131) BP: (104-119)/(65-72) 104/65 (08/12 1131) SpO2:  [100 %] 100 % (08/12 1131) Weight:  [64.9 kg] 64.9 kg (08/12 0423)  Intake/Output from previous day: 08/11 0701 - 08/12 0700 In: 1280 [P.O.:1200; I.V.:80] Out: 600 [Urine:600] Intake/Output from this shift: Total I/O In: 240 [P.O.:240] Out: 150 [Urine:150]  Physical Exam: Neck: no adenopathy, no carotid bruit, no JVD and supple, symmetrical, trachea midline Lungs: decreased breath sounds at bases and right lung rhonchi improved Heart: regular rate and rhythm, S1, S2 normal and 2/6 systolic murmur noted Abdomen: soft, non-tender; bowel sounds normal; no masses,  no organomegaly Extremities: extremities normal, atraumatic, no cyanosis or edema  Lab Results: Recent Labs    04/08/18 0337  WBC 5.2  HGB 10.5*  PLT 141*   Recent Labs    04/08/18 0337 04/09/18 0546  NA 141 141  K 3.1* 3.6  CL 106 107  CO2 23 23  GLUCOSE 128* 109*  BUN 28* 29*  CREATININE 1.83* 1.77*   Recent Labs    04/08/18 0337 04/08/18 0908  TROPONINI 0.17* 0.17*   Hepatic Function Panel Recent Labs    04/07/18 1624  PROT 7.5  ALBUMIN 3.5  AST 26  ALT 21  ALKPHOS 64  BILITOT 1.2  BILIDIR 0.3*  IBILI 0.9   No results for input(s): CHOL in the last 72 hours. No results for input(s): PROTIME in the last 72 hours.  Imaging: Imaging results have been reviewed and No results found.  Cardiac Studies:  Assessment/Plan:  Status postAtypical chest painMI ruled out Severe ischemic/nonischemic dilated cardiomyopathy  Minimally elevated troponin I secondary to above/renal insufficiency resolvingright lung pneumonia Coronary artery disease history of inferior wall  MI in the past History of V. Fib arrest in the past status post ICD in the past and ICD discharges in the past Valvular heart disease Severe mitral regurgitation Hypertension Diabetes mellitus Hyperlipidemia Hypothyroidism Acute on Chronic kidney disease stage III Moderate malnutrition Plan Continue present management. Increase ambulation as tolerated. Check labs and x-ray in a.m.  LOS: 3 days    Rinaldo CloudHarwani, Mohsin Crum 04/10/2018, 2:18 PM

## 2018-04-11 ENCOUNTER — Inpatient Hospital Stay (HOSPITAL_COMMUNITY): Payer: Medicare HMO

## 2018-04-11 LAB — CBC
HEMATOCRIT: 35 % — AB (ref 36.0–46.0)
HEMOGLOBIN: 10.7 g/dL — AB (ref 12.0–15.0)
MCH: 28.5 pg (ref 26.0–34.0)
MCHC: 30.6 g/dL (ref 30.0–36.0)
MCV: 93.3 fL (ref 78.0–100.0)
Platelets: 168 10*3/uL (ref 150–400)
RBC: 3.75 MIL/uL — ABNORMAL LOW (ref 3.87–5.11)
RDW: 16.6 % — ABNORMAL HIGH (ref 11.5–15.5)
WBC: 4.9 10*3/uL (ref 4.0–10.5)

## 2018-04-11 LAB — GLUCOSE, CAPILLARY
GLUCOSE-CAPILLARY: 161 mg/dL — AB (ref 70–99)
Glucose-Capillary: 104 mg/dL — ABNORMAL HIGH (ref 70–99)

## 2018-04-11 LAB — BASIC METABOLIC PANEL
Anion gap: 10 (ref 5–15)
BUN: 22 mg/dL (ref 8–23)
CHLORIDE: 108 mmol/L (ref 98–111)
CO2: 23 mmol/L (ref 22–32)
Calcium: 8.2 mg/dL — ABNORMAL LOW (ref 8.9–10.3)
Creatinine, Ser: 1.59 mg/dL — ABNORMAL HIGH (ref 0.44–1.00)
GFR calc Af Amer: 36 mL/min — ABNORMAL LOW (ref >60)
GFR calc non Af Amer: 31 mL/min — ABNORMAL LOW (ref >60)
Glucose, Bld: 103 mg/dL — ABNORMAL HIGH (ref 70–99)
POTASSIUM: 3.9 mmol/L (ref 3.5–5.1)
SODIUM: 141 mmol/L (ref 135–145)

## 2018-04-11 LAB — MAGNESIUM: MAGNESIUM: 2.3 mg/dL (ref 1.7–2.4)

## 2018-04-11 MED ORDER — ROSUVASTATIN CALCIUM 20 MG PO TABS: 10.0000 mg | ORAL_TABLET | Freq: Every day | ORAL | 3 refills | Status: AC

## 2018-04-11 MED ORDER — AZITHROMYCIN 500 MG PO TABS: 500.0000 mg | ORAL_TABLET | Freq: Every day | ORAL | 0 refills | Status: DC

## 2018-04-11 MED ORDER — AZITHROMYCIN 500 MG PO TABS: 500.0000 mg | ORAL_TABLET | Freq: Every day | ORAL | Status: DC

## 2018-04-11 MED ORDER — SPIRONOLACTONE 25 MG PO TABS: 12.5000 mg | ORAL_TABLET | Freq: Every day | ORAL | 3 refills | Status: DC

## 2018-04-11 NOTE — Discharge Summary (Signed)
NAME: Leta SpellerMICHAEL, Arrayah B. MEDICAL RECORD [id number] ACCOUNT [id number] DATE OF BIRTH:Dec 19, 1945 FACILITY: [facility] LOCATION: [facility] PHYSICIAN:Christal Lagerstrom Jeoffrey MassedN. Chanel Mckesson, MD  DISCHARGE SUMMARY  DATE OF DISCHARGE:  04/11/2018  DISCHARGE DIAGNOSES: 1.  Atypical chest pain, rule out myocardial infarction. 2.  Severe ischemic/nonischemic dilated cardiomyopathy. 3.  Minimally elevated troponin I secondary to above/renal insufficiency. 4.  Probable right lung pneumonia. 5.  Coronary artery disease, history of inferior wall myocardial infarction in the past. 6.  History of ventricular fibrillation cardiac arrest in the past, status post implantable cardiac defibrillator  in the past and also implantable cardiac defibrillator discharges in the past. 7.  Valvular heart disease. 8.  Severe mitral regurgitation. 9.  Hypertension. 10.  Hyperlipidemia. 11.  Hypothyroidism. 12.  Acute on chronic kidney disease stage III. 13.  Dehydration.  FINAL DIAGNOSES: 1.  Status post atypical chest pain, myocardial infarction ruled out. 2.  Severe ischemic/nonischemic valvular dilated cardiomyopathy. 3.  Minimally elevated troponin I secondary to above. 4.  Resolving right lung pneumonia. 5.  Coronary artery disease, history of inferior wall myocardial infarction in the past, status post percutaneous coronary intervention to right coronary artery in the past. 6.  History of ventricular fibrillation arrest in the past, status post implantable cardiac defibrillator in the past and also implantable cardiac defibrillator discharges in the past. 7.  Valvular heart disease. 8.  Severe mitral regurgitation. 9.  Hypertension. 10.  Diabetes mellitus. 11.  Hyperlipidemia. 12.  Hypothyroidism. 13.  Acute on chronic kidney disease stage III, improved. Moderate malnutrition. 14.  Left bundle branch block.  DISCHARGE HOME MEDICATIONS:   1.  Azithromycin 500 mg 1 tablet daily for 3 more days. 2.  Aspirin 81 mg daily. 3.   Carvedilol 25 mg half tablet. 4.  Lasix 40 mg twice daily. 5.  Nitrostat 0.4 mg sublingual p.r.n. 6.  O2 via nasal cannula 2 liters as before. 7.  Entresto 97/103 mg twice daily as before. 8.  Levothyroxine 175 mcg daily as before. 9.  Crestor 10 mg daily. 10.  Spironolactone 12.5 mg daily.    The patient has been advised to stop metformin and BiDil for now.  CONDITION AT DISCHARGE:  Stable.  Heart failure instructions have been given.    Follow up with me in 1 week.  Follow up with EP as scheduled.  The patient will discuss with EP regarding CRT ICD.  We will also discuss with her regarding mitral clip for severe mitral regurgitation.  CONDITION AT DISCHARGE:  Stable.  BRIEF HISTORY:  The patient is a 72 year old female with past medical history significant for coronary artery disease, history of inferior wall myocardial infarction, status post PCI to RCA in the past. Ischemic/nonischemic dilated cardiomyopathy, status post ICD, history of cardiac arrest in the past, hypertension, hyperlipidemia, hypothyroidism, chronic kidney disease stage III, history of congestive heart failure secondary to severe depressed LV  systolic function, chronic severe mitral regurgitation.  She came to the ER complaining of cough associated with pleuritic chest pain, back pain and abdominal pain for the last few days associated with nausea, vomiting and diarrhea.  States has not been  able to keep anything down for the last few days.  Denies any fever or chills.  States has not voided urine since yesterday.  The patient denies any anginal chest pain.  Denies PND, orthopnea, leg swelling.  Workup in the ED showed minimally elevated  troponin I.  EKG showed normal sinus rhythm with old left bundle branch block.  The patient was also  noted to have a new infiltrate in the right mid lung field and minimally elevated lactic acid.  The patient was started on IV antibiotics in the ED after  getting blood  cultures.  PHYSICAL EXAMINATION:  GENERAL:  On examination, she was alert, awake, oriented x3. VITAL SIGNS:  Blood pressure was 133/98, pulse 91, temperature of 99.2, respiration rate was 30, O2 sats were 98. GENERAL:  Alert, awake, oriented to time and place. HEENT:  Conjunctivae pink. NECK:  Supple, no JVD, no bruit. LUNGS:  Decreased breath sounds at bases with right lung field rhonchi noted. CARDIOVASCULAR:  S1, S2 was normal.  There was II/VI systolic murmur of mitral regurgitation. ABDOMEN:  Soft.  Bowel sounds are present, nontender. EXTREMITIES:  There is no clubbing, cyanosis or edema. NEUROLOGIC:  Grossly intact.  LABORATORY DATA:  Sodium was 142, potassium 3.5, BUN 27, creatinine 1.8.  Her hemoglobin was 11.6, hematocrit 36.5, white count of 5.0.  Lactic acid was 2.24.  Troponin I was 0.2, 0.18, 0.17, 0.17 which is trending down.  Last electrolytes showed sodium  141, potassium 3.9, BUN 22, creatinine has come down to 1.59, hemoglobin is 10.7, hematocrit 35, white count 4.9.  BNP was above 4500.  Her TSH was 0.244.  Blood cultures so far has been negative.    The patient had CT of the abdomen which showed a small amount of ascites with diffuse anasarca and bilateral pleural effusions, right greater than the left.  Cardiomegaly, aortic atherosclerosis.  BRIEF HOSPITAL COURSE:  The patient was admitted to Telemetry Unit.  Pan cultures were obtained.  The patient was started on IV antibiotics and was hydrated slowly with improvement in her renal function and also improvement in her breathing and coughing.   The patient remained afebrile during the hospital stay.  IV fluids were discontinued and was restarted on her home medications which she is tolerating well.  The patient did not have any episodes of anginal chest pain during the hospital stay.  The  patient is ambulating in room without any problems.    The patient will be discharged home on the above medications and will be  followed up in my office in one week.  She will follow up with EP as scheduled and discuss further regarding upgrading the ICD.  AN/NUANCE D:04/11/2018 T:04/11/2018 JOB:[id number]

## 2018-04-11 NOTE — Discharge Instructions (Signed)
Community-Acquired Pneumonia, Adult Pneumonia is an infection of the lungs. One type of pneumonia can happen while a person is in a hospital. A different type can happen when a person is not in a hospital (community-acquired pneumonia). It is easy for this kind to spread from person to person. It can spread to you if you breathe near an infected person who coughs or sneezes. Some symptoms include:  A dry cough.  A wet (productive) cough.  Fever.  Sweating.  Chest pain.  Follow these instructions at home:  Take over-the-counter and prescription medicines only as told by your doctor. ? Only take cough medicine if you are losing sleep. ? If you were prescribed an antibiotic medicine, take it as told by your doctor. Do not stop taking the antibiotic even if you start to feel better.  Sleep with your head and neck raised (elevated). You can do this by putting a few pillows under your head, or you can sleep in a recliner.  Do not use tobacco products. These include cigarettes, chewing tobacco, and e-cigarettes. If you need help quitting, ask your doctor.  Drink enough water to keep your pee (urine) clear or pale yellow. A shot (vaccine) can help prevent pneumonia. Shots are often suggested for:  People older than 73 years of age.  People older than 73 years of age: ? Who are having cancer treatment. ? Who have long-term (chronic) lung disease. ? Who have problems with their body's defense system (immune system).  You may also prevent pneumonia if you take these actions:  Get the flu (influenza) shot every year.  Go to the dentist as often as told.  Wash your hands often. If soap and water are not available, use hand sanitizer.  Contact a doctor if:  You have a fever.  You lose sleep because your cough medicine does not help. Get help right away if:  You are short of breath and it gets worse.  You have more chest pain.  Your sickness gets worse. This is very serious  if: ? You are an older adult. ? Your body's defense system is weak.  You cough up blood. This information is not intended to replace advice given to you by your health care provider. Make sure you discuss any questions you have with your health care provider. Document Released: 02/02/2008 Document Revised: 01/22/2016 Document Reviewed: 12/11/2014 Elsevier Interactive Patient Education  2018 [employer]. Heart Failure Heart failure is a condition in which the heart has trouble pumping blood because it has become weak or stiff. This means that the heart does not pump blood efficiently for the body to work well. For some people with heart failure, fluid may back up into the lungs and there may be swelling (edema) in the lower legs. Heart failure is usually a long-term (chronic) condition. It is important for you to take good care of yourself and follow the treatment plan from your health care provider. What are the causes? This condition is caused by some health problems, including:  High blood pressure (hypertension). Hypertension causes the heart muscle to work harder than normal. High blood pressure eventually causes the heart to become stiff and weak.  Coronary artery disease (CAD). CAD is the buildup of cholesterol and fat (plaques) in the arteries of the heart.  Heart attack (myocardial infarction). Injured tissue, which is caused by the heart attack, does not contract as well and the heart's ability to pump blood is weakened.  Abnormal heart valves. When the heart  valves do not open and close properly, the heart muscle must pump harder to keep the blood flowing.  Heart muscle disease (cardiomyopathy or myocarditis). Heart muscle disease is damage to the heart muscle from a variety of causes, such as drug or alcohol abuse, infections, or unknown causes. These can increase the risk of heart failure.  Lung disease. When the lungs do not work properly, the heart must work harder.  What  increases the risk? Risk of heart failure increases as a person ages. This condition is also more likely to develop in people who:  Are overweight.  Are female.  Smoke or chew tobacco.  Abuse alcohol or illegal drugs.  Have taken medicines that can damage the heart, such as chemotherapy drugs.  Have diabetes. ? High blood sugar (glucose) is associated with high fat (lipid) levels in the blood. ? Diabetes can also damage tiny blood vessels that carry nutrients to the heart muscle.  Have abnormal heart rhythms.  Have thyroid problems.  Have low blood counts (anemia).  What are the signs or symptoms? Symptoms of this condition include:  Shortness of breath with activity, such as when climbing stairs.  Persistent cough.  Swelling of the feet, ankles, legs, or abdomen.  Unexplained weight gain.  Difficulty breathing when lying flat (orthopnea).  Waking from sleep because of the need to sit up and get more air.  Rapid heartbeat.  Fatigue and loss of energy.  Feeling light-headed, dizzy, or close to fainting.  Loss of appetite.  Nausea.  Increased urination during the night (nocturia).  Confusion.  How is this diagnosed? This condition is diagnosed based on:  Medical history, symptoms, and a physical exam.  Diagnostic tests, which may include: ? Echocardiogram. ? Electrocardiogram (ECG). ? Chest X-ray. ? Blood tests. ? Exercise stress test. ? Radionuclide scans. ? Cardiac catheterization and angiogram.  How is this treated? Treatment for this condition is aimed at managing the symptoms of heart failure. Medicines, behavioral changes, or other treatments may be necessary to treat heart failure. Medicines These may include:  Angiotensin-converting enzyme (ACE) inhibitors. This type of medicine blocks the effects of a blood protein called angiotensin-converting enzyme. ACE inhibitors relax (dilate) the blood vessels and help to lower blood  pressure.  Angiotensin receptor blockers (ARBs). This type of medicine blocks the actions of a blood protein called angiotensin. ARBs dilate the blood vessels and help to lower blood pressure.  Water pills (diuretics). Diuretics cause the kidneys to remove salt and water from the blood. The extra fluid is removed through urination, leaving a lower volume of blood that the heart has to pump.  Beta blockers. These improve heart muscle strength and they prevent the heart from beating too quickly.  Digoxin. This increases the force of the heartbeat.  Healthy behavior changes These may include:  Reaching and maintaining a healthy weight.  Stopping smoking or chewing tobacco.  Eating heart-healthy foods.  Limiting or avoiding alcohol.  Stopping use of street drugs (illegal drugs).  Physical activity.  Other treatments These may include:  Surgery to open blocked coronary arteries or repair damaged heart valves.  Placement of a biventricular pacemaker to improve heart muscle function (cardiac resynchronization therapy). This device paces both the right ventricle and left ventricle.  Placement of a device to treat serious abnormal heart rhythms (implantable cardioverter defibrillator, or ICD).  Placement of a device to improve the pumping ability of the heart (left ventricular assist device, or LVAD).  Heart transplant. This can cure heart failure,  and it is considered for certain patients who do not improve with other therapies.  Follow these instructions at home: Medicines  Take over-the-counter and prescription medicines only as told by your health care provider. Medicines are important in reducing the workload of your heart, slowing the progression of heart failure, and improving your symptoms. ? Do not stop taking your medicine unless your health care provider told you to do that. ? Do not skip any dose of medicine. ? Refill your prescriptions before you run out of medicine.  You need your medicines every day. Eating and drinking   Eat heart-healthy foods. Talk with a dietitian to make an eating plan that is right for you. ? Choose foods that contain no trans fat and are low in saturated fat and cholesterol. Healthy choices include fresh or frozen fruits and vegetables, fish, lean meats, legumes, fat-free or low-fat dairy products, and whole-grain or high-fiber foods. ? Limit salt (sodium) if directed by your health care provider. Sodium restriction may reduce symptoms of heart failure. Ask a dietitian to recommend heart-healthy seasonings. ? Use healthy cooking methods instead of frying. Healthy methods include roasting, grilling, broiling, baking, poaching, steaming, and stir-frying.  Limit your fluid intake if directed by your health care provider. Fluid restriction may reduce symptoms of heart failure. Lifestyle  Stop smoking or using chewing tobacco. Nicotine and tobacco can damage your heart and your blood vessels. Do not use nicotine gum or patches before talking to your health care provider.  Limit alcohol intake to no more than 1 drink per day for non-pregnant women and 2 drinks per day for men. One drink equals 12 oz of beer, 5 oz of wine, or 1 oz of hard liquor. ? Drinking more than that is harmful to your heart. Tell your health care provider if you drink alcohol several times a week. ? Talk with your health care provider about whether any level of alcohol use is safe for you. ? If your heart has already been damaged by alcohol or you have severe heart failure, drinking alcohol should be stopped completely.  Stop use of illegal drugs.  Lose weight if directed by your health care provider. Weight loss may reduce symptoms of heart failure.  Do moderate physical activity if directed by your health care provider. People who are elderly and people with severe heart failure should consult with a health care provider for physical activity  recommendations. Monitor important information  Weigh yourself every day. Keeping track of your weight daily helps you to notice excess fluid sooner. ? Weigh yourself every morning after you urinate and before you eat breakfast. ? Wear the same amount of clothing each time you weigh yourself. ? Record your daily weight. Provide your health care provider with your weight record.  Monitor and record your blood pressure as told by your health care provider.  Check your pulse as told by your health care provider. Dealing with extreme temperatures  If the weather is extremely hot: ? Avoid vigorous physical activity. ? Use air conditioning or fans or seek a cooler location. ? Avoid caffeine and alcohol. ? Wear loose-fitting, lightweight, and light-colored clothing.  If the weather is extremely cold: ? Avoid vigorous physical activity. ? Layer your clothes. ? Wear mittens or gloves, a hat, and a scarf when you go outside. ? Avoid alcohol. General instructions  Manage other health conditions such as hypertension, diabetes, thyroid disease, or abnormal heart rhythms as told by your health care provider.  Learn to  manage stress. If you need help to do this, ask your health care provider.  Plan rest periods when fatigued.  Get ongoing education and support as needed.  Participate in or seek rehabilitation as needed to maintain or improve independence and quality of life.  Stay up to date with immunizations. Keeping current on pneumococcal and influenza immunizations is especially important to prevent respiratory infections.  Keep all follow-up visits as told by your health care provider. This is important. Contact a health care provider if:  You have a rapid weight gain.  You have increasing shortness of breath that is unusual for you.  You are unable to participate in your usual physical activities.  You tire easily.  You cough more than normal, especially with physical  activity.  You have any swelling or more swelling in areas such as your hands, feet, ankles, or abdomen.  You are unable to sleep because it is hard to breathe.  You feel like your heart is beating quickly (palpitations).  You become dizzy or light-headed when you stand up. Get help right away if:  You have difficulty breathing.  You notice or your family notices a change in your awareness, such as having trouble staying awake or having difficulty with concentration.  You have pain or discomfort in your chest.  You have an episode of fainting (syncope). This information is not intended to replace advice given to you by your health care provider. Make sure you discuss any questions you have with your health care provider. Document Released: 08/16/2005 Document Revised: 04/20/2016 Document Reviewed: 03/10/2016 Elsevier Interactive Patient Education  [employer].

## 2018-04-11 NOTE — Progress Notes (Signed)
Pt got discharged to home, discharge instructions provided and patient showed understanding to it, IV taken out,Telemonitor DC,pt left unit in wheelchair with all of the belongings accompanied with a family member (husband and daughter)  Crystal FarberRekha, RCharity fundraiser

## 2018-04-11 NOTE — Discharge Summary (Signed)
Discharge summary dictated on 04/11/2018 dictation number is [phone]

## 2018-04-12 LAB — CULTURE, BLOOD (ROUTINE X 2)
CULTURE: NO GROWTH
Culture: NO GROWTH
Special Requests: ADEQUATE

## 2018-04-21 ENCOUNTER — Telehealth: Payer: Self-pay | Admitting: Internal Medicine

## 2018-04-21 NOTE — Telephone Encounter (Signed)
PER PT CALL:  Pt stated her Card wants to know when pt will have her Pacer / Defib replaced please give her a call back.  Dr Rinaldo CloudMohan Harwani # [phone] fax # [phone]

## 2018-04-25 ENCOUNTER — Ambulatory Visit (INDEPENDENT_AMBULATORY_CARE_PROVIDER_SITE_OTHER): Payer: Medicare HMO | Admitting: *Deleted

## 2018-04-25 DIAGNOSIS — I255 Ischemic cardiomyopathy: Secondary | ICD-10-CM | POA: Diagnosis not present

## 2018-04-25 DIAGNOSIS — I5022 Chronic systolic (congestive) heart failure: Secondary | ICD-10-CM

## 2018-04-25 NOTE — Telephone Encounter (Signed)
LMTCB//sss 

## 2018-04-25 NOTE — Progress Notes (Signed)
Remote ICD transmission.   

## 2018-04-25 NOTE — Telephone Encounter (Signed)
Attempted to call patient x 1- mailbox full.

## 2018-04-26 NOTE — Telephone Encounter (Signed)
Unable to leave voicemail d/t box full

## 2018-04-27 ENCOUNTER — Encounter: Payer: Self-pay | Admitting: Cardiology

## 2018-05-17 ENCOUNTER — Encounter (HOSPITAL_COMMUNITY): Payer: Self-pay

## 2018-05-17 ENCOUNTER — Inpatient Hospital Stay (HOSPITAL_COMMUNITY)
Admission: EM | Admit: 2018-05-17 | Discharge: 2018-05-21 | DRG: 194 | Disposition: A | Discharge status: Home Health | Payer: Medicare HMO | Attending: Internal Medicine | Admitting: Internal Medicine

## 2018-05-17 ENCOUNTER — Inpatient Hospital Stay (HOSPITAL_COMMUNITY): Payer: Medicare HMO

## 2018-05-17 ENCOUNTER — Emergency Department (HOSPITAL_COMMUNITY): Payer: Medicare HMO

## 2018-05-17 DIAGNOSIS — Z7982 Long term (current) use of aspirin: Secondary | ICD-10-CM

## 2018-05-17 DIAGNOSIS — Z79890 Hormone replacement therapy (postmenopausal): Secondary | ICD-10-CM

## 2018-05-17 DIAGNOSIS — I5022 Chronic systolic (congestive) heart failure: Secondary | ICD-10-CM | POA: Diagnosis present

## 2018-05-17 DIAGNOSIS — I130 Hypertensive heart and chronic kidney disease with heart failure and stage 1 through stage 4 chronic kidney disease, or unspecified chronic kidney disease: Secondary | ICD-10-CM | POA: Diagnosis present

## 2018-05-17 DIAGNOSIS — N179 Acute kidney failure, unspecified: Secondary | ICD-10-CM | POA: Diagnosis present

## 2018-05-17 DIAGNOSIS — N183 Chronic kidney disease, stage 3 unspecified: Secondary | ICD-10-CM

## 2018-05-17 DIAGNOSIS — Z66 Do not resuscitate: Secondary | ICD-10-CM | POA: Diagnosis present

## 2018-05-17 DIAGNOSIS — E039 Hypothyroidism, unspecified: Secondary | ICD-10-CM | POA: Diagnosis present

## 2018-05-17 DIAGNOSIS — Z8249 Family history of ischemic heart disease and other diseases of the circulatory system: Secondary | ICD-10-CM

## 2018-05-17 DIAGNOSIS — E119 Type 2 diabetes mellitus without complications: Secondary | ICD-10-CM

## 2018-05-17 DIAGNOSIS — Z87891 Personal history of nicotine dependence: Secondary | ICD-10-CM

## 2018-05-17 DIAGNOSIS — Z955 Presence of coronary angioplasty implant and graft: Secondary | ICD-10-CM | POA: Diagnosis not present

## 2018-05-17 DIAGNOSIS — I472 Ventricular tachycardia: Secondary | ICD-10-CM | POA: Diagnosis present

## 2018-05-17 DIAGNOSIS — E1121 Type 2 diabetes mellitus with diabetic nephropathy: Secondary | ICD-10-CM | POA: Diagnosis present

## 2018-05-17 DIAGNOSIS — J9611 Chronic respiratory failure with hypoxia: Secondary | ICD-10-CM | POA: Diagnosis present

## 2018-05-17 DIAGNOSIS — J189 Pneumonia, unspecified organism: Secondary | ICD-10-CM | POA: Diagnosis present

## 2018-05-17 DIAGNOSIS — E860 Dehydration: Secondary | ICD-10-CM | POA: Diagnosis present

## 2018-05-17 DIAGNOSIS — I255 Ischemic cardiomyopathy: Secondary | ICD-10-CM | POA: Diagnosis present

## 2018-05-17 DIAGNOSIS — E785 Hyperlipidemia, unspecified: Secondary | ICD-10-CM | POA: Diagnosis present

## 2018-05-17 DIAGNOSIS — Y95 Nosocomial condition: Secondary | ICD-10-CM | POA: Diagnosis present

## 2018-05-17 DIAGNOSIS — I252 Old myocardial infarction: Secondary | ICD-10-CM | POA: Diagnosis not present

## 2018-05-17 DIAGNOSIS — I2510 Atherosclerotic heart disease of native coronary artery without angina pectoris: Secondary | ICD-10-CM | POA: Diagnosis present

## 2018-05-17 DIAGNOSIS — E875 Hyperkalemia: Secondary | ICD-10-CM | POA: Diagnosis present

## 2018-05-17 DIAGNOSIS — E1122 Type 2 diabetes mellitus with diabetic chronic kidney disease: Secondary | ICD-10-CM | POA: Diagnosis present

## 2018-05-17 DIAGNOSIS — I10 Essential (primary) hypertension: Secondary | ICD-10-CM | POA: Diagnosis not present

## 2018-05-17 DIAGNOSIS — J188 Other pneumonia, unspecified organism: Secondary | ICD-10-CM | POA: Diagnosis present

## 2018-05-17 DIAGNOSIS — Z9981 Dependence on supplemental oxygen: Secondary | ICD-10-CM | POA: Diagnosis not present

## 2018-05-17 DIAGNOSIS — Z8674 Personal history of sudden cardiac arrest: Secondary | ICD-10-CM | POA: Diagnosis not present

## 2018-05-17 DIAGNOSIS — Z95810 Presence of automatic (implantable) cardiac defibrillator: Secondary | ICD-10-CM | POA: Diagnosis not present

## 2018-05-17 LAB — BASIC METABOLIC PANEL
ANION GAP: 17 — AB (ref 5–15)
BUN: 32 mg/dL — ABNORMAL HIGH (ref 8–23)
CHLORIDE: 104 mmol/L (ref 98–111)
CO2: 21 mmol/L — ABNORMAL LOW (ref 22–32)
Calcium: 9 mg/dL (ref 8.9–10.3)
Creatinine, Ser: 2.21 mg/dL — ABNORMAL HIGH (ref 0.44–1.00)
GFR calc non Af Amer: 21 mL/min — ABNORMAL LOW (ref >60)
GFR, EST AFRICAN AMERICAN: 24 mL/min — AB (ref >60)
Glucose, Bld: 125 mg/dL — ABNORMAL HIGH (ref 70–99)
POTASSIUM: 4 mmol/L (ref 3.5–5.1)
SODIUM: 142 mmol/L (ref 135–145)

## 2018-05-17 LAB — GLUCOSE, CAPILLARY
GLUCOSE-CAPILLARY: 104 mg/dL — AB (ref 70–99)
GLUCOSE-CAPILLARY: 161 mg/dL — AB (ref 70–99)

## 2018-05-17 LAB — CBC
HEMATOCRIT: 38.1 % (ref 36.0–46.0)
Hemoglobin: 11.9 g/dL — ABNORMAL LOW (ref 12.0–15.0)
MCH: 28.8 pg (ref 26.0–34.0)
MCHC: 31.2 g/dL (ref 30.0–36.0)
MCV: 92.3 fL (ref 78.0–100.0)
PLATELETS: 164 10*3/uL (ref 150–400)
RBC: 4.13 MIL/uL (ref 3.87–5.11)
RDW: 18.7 % — ABNORMAL HIGH (ref 11.5–15.5)
WBC: 5.2 10*3/uL (ref 4.0–10.5)

## 2018-05-17 LAB — I-STAT TROPONIN, ED: Troponin i, poc: 0.03 ng/mL (ref 0.00–0.08)

## 2018-05-17 LAB — PROCALCITONIN: PROCALCITONIN: 0.24 ng/mL

## 2018-05-17 LAB — T4, FREE: FREE T4: 2 ng/dL — AB (ref 0.82–1.77)

## 2018-05-17 LAB — TSH: TSH: 0.856 u[IU]/mL (ref 0.350–4.500)

## 2018-05-17 MED ORDER — SODIUM CHLORIDE 0.9% FLUSH
3.0000 mL | Freq: Two times a day (BID) | INTRAVENOUS | Status: DC
  Administered 2018-05-17 – 2018-05-21 (×7): 3 mL via INTRAVENOUS

## 2018-05-17 MED ORDER — VANCOMYCIN HCL 10 G IV SOLR
1250.0000 mg | Freq: Once | INTRAVENOUS | Status: AC
  Administered 2018-05-17: 1250 mg via INTRAVENOUS
  Filled 2018-05-17: qty 1250

## 2018-05-17 MED ORDER — LEVOTHYROXINE SODIUM 175 MCG PO TABS
175.0000 ug | ORAL_TABLET | Freq: Every day | ORAL | Status: DC
  Administered 2018-05-18 – 2018-05-21 (×4): 175 ug via ORAL
  Filled 2018-05-17 (×4): qty 1

## 2018-05-17 MED ORDER — SODIUM CHLORIDE 0.9% FLUSH: 3.0000 mL | INTRAVENOUS | Status: DC | PRN

## 2018-05-17 MED ORDER — SODIUM CHLORIDE 0.9 % IV SOLN: 1.0000 g | INTRAVENOUS | Status: DC

## 2018-05-17 MED ORDER — SODIUM CHLORIDE 0.9 % IV SOLN
1.0000 g | Freq: Once | INTRAVENOUS | Status: AC
  Administered 2018-05-17: 1 g via INTRAVENOUS
  Filled 2018-05-17: qty 10

## 2018-05-17 MED ORDER — VANCOMYCIN HCL 500 MG IV SOLR: 500.0000 mg | INTRAVENOUS | Status: DC

## 2018-05-17 MED ORDER — MORPHINE SULFATE (PF) 2 MG/ML IV SOLN
2.0000 mg | INTRAVENOUS | Status: DC | PRN
  Administered 2018-05-17 – 2018-05-21 (×5): 2 mg via INTRAVENOUS
  Filled 2018-05-17 (×5): qty 1

## 2018-05-17 MED ORDER — ASPIRIN EC 81 MG PO TBEC
81.0000 mg | DELAYED_RELEASE_TABLET | Freq: Every day | ORAL | Status: DC
  Administered 2018-05-18 – 2018-05-21 (×4): 81 mg via ORAL
  Filled 2018-05-17 (×4): qty 1

## 2018-05-17 MED ORDER — INSULIN ASPART 100 UNIT/ML ~~LOC~~ SOLN
0.0000 [IU] | Freq: Three times a day (TID) | SUBCUTANEOUS | Status: DC
  Administered 2018-05-18 (×2): 2 [IU] via SUBCUTANEOUS
  Administered 2018-05-18: 3 [IU] via SUBCUTANEOUS
  Administered 2018-05-19 (×2): 2 [IU] via SUBCUTANEOUS
  Administered 2018-05-19: 1 [IU] via SUBCUTANEOUS
  Administered 2018-05-20 (×3): 2 [IU] via SUBCUTANEOUS
  Administered 2018-05-21: 1 [IU] via SUBCUTANEOUS
  Administered 2018-05-21: 2 [IU] via SUBCUTANEOUS

## 2018-05-17 MED ORDER — SODIUM CHLORIDE 0.9 % IV SOLN: 250.0000 mL | INTRAVENOUS | Status: DC | PRN

## 2018-05-17 MED ORDER — SODIUM CHLORIDE 0.9 % IV SOLN
1.0000 g | Freq: Three times a day (TID) | INTRAVENOUS | Status: DC
  Filled 2018-05-17 (×2): qty 1

## 2018-05-17 MED ORDER — ROSUVASTATIN CALCIUM 10 MG PO TABS
10.0000 mg | ORAL_TABLET | Freq: Every day | ORAL | Status: DC
  Administered 2018-05-18 – 2018-05-21 (×4): 10 mg via ORAL
  Filled 2018-05-17 (×4): qty 1

## 2018-05-17 MED ORDER — SODIUM CHLORIDE 0.9 % IV SOLN
INTRAVENOUS | Status: DC | PRN
  Administered 2018-05-17: 500 mL via INTRAVENOUS

## 2018-05-17 MED ORDER — PREDNISONE 20 MG PO TABS
40.0000 mg | ORAL_TABLET | Freq: Every day | ORAL | Status: AC
  Administered 2018-05-17 – 2018-05-21 (×5): 40 mg via ORAL
  Filled 2018-05-17 (×5): qty 2

## 2018-05-17 MED ORDER — SACUBITRIL-VALSARTAN 97-103 MG PO TABS
1.0000 | ORAL_TABLET | Freq: Two times a day (BID) | ORAL | Status: DC
  Administered 2018-05-17: 1 via ORAL
  Filled 2018-05-17 (×2): qty 1

## 2018-05-17 MED ORDER — CARVEDILOL 12.5 MG PO TABS
12.5000 mg | ORAL_TABLET | Freq: Two times a day (BID) | ORAL | Status: DC
  Administered 2018-05-17 – 2018-05-21 (×8): 12.5 mg via ORAL
  Filled 2018-05-17 (×8): qty 1

## 2018-05-17 MED ORDER — ALBUTEROL SULFATE (2.5 MG/3ML) 0.083% IN NEBU: 2.5000 mg | INHALATION_SOLUTION | RESPIRATORY_TRACT | Status: DC | PRN

## 2018-05-17 MED ORDER — ENOXAPARIN SODIUM 30 MG/0.3ML ~~LOC~~ SOLN
30.0000 mg | SUBCUTANEOUS | Status: DC
  Administered 2018-05-17 – 2018-05-20 (×4): 30 mg via SUBCUTANEOUS
  Filled 2018-05-17 (×4): qty 0.3

## 2018-05-17 NOTE — Plan of Care (Signed)
  Problem: Pain Managment: Goal: General experience of comfort will improve Outcome: Progressing   Problem: Safety: Goal: Ability to remain free from injury will improve Outcome: Progressing   Problem: Skin Integrity: Goal: Risk for impaired skin integrity will decrease Outcome: Progressing   
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
## 2018-05-17 NOTE — ED Triage Notes (Signed)
Pt presents for evaluation of CP and SOB worsening over the past 3 days. Reports was admitted last month for similar, hx of CHF.

## 2018-05-17 NOTE — Progress Notes (Addendum)
Pharmacy Antibiotic Note  Crystal Duran is a 72 y.o. female admitted on 05/17/2018 with chest pain and SOB. Pharmacy has been consulted for vancomycin dosing for PNA.  Patient is also on cefepime.  She has CKD3 and her SCr is higher than baseline.  Afebrile, WBC WNL.   Plan: Vanc 1250mg  IV x 1, then 500mg  IV Q24H x 8 days total Change cefepime to 1gm IV Q24H, treat x8 days total per MD Monitor renal fxn, clinical progress, vanc trough as indicated   Height: 5' 4.5" (163.8 cm) Weight: 131 lb 11.2 oz (59.7 kg) IBW/kg (Calculated) : 55.85  Temp (24hrs), Avg:98.3 F (36.8 C), Min:98.3 F (36.8 C), Max:98.3 F (36.8 C)  Recent Labs  Lab 05/17/18 1212  WBC 5.2  CREATININE 2.21*    Estimated Creatinine Clearance: 20 mL/min (A) (by C-G formula based on SCr of 2.21 mg/dL (H)).    Allergies  Allergen Reactions  . Eggs Or Egg-Derived Products Nausea And Vomiting     Vanc 9/18 >> Cefepime 9/18 >>  9/18 BCx -    Norrine Ballester D. Laney Potashang, PharmD, BCPS, BCCCP 05/17/2018, 4:08 PM

## 2018-05-17 NOTE — ED Notes (Signed)
ED Provider at bedside. 

## 2018-05-17 NOTE — H&P (Signed)
History and Physical    Crystal Duran ZOX:[id number] DOB: 04/11/1946 DOA: 05/17/2018  PCP: Fleet Contras, MD Consultants:  Sharyn Lull - cardiology; Graciela Husbands - EP Patient coming from:  Home - lives with husband; NOK: Husband, [phone]  Chief Complaint:  CP/SOB  HPI: Crystal Duran is a 72 y.o. female with medical history significant of DM; CAD; prior cardiac arrest resulting from vfib with AICD placement; severe systolic CHF (EF 56-21% in 11/18) from ischemic cardiomyopathy; hypothyroidism; HTN; HLD; and stage 3 CKD presenting with CP/SOB. For the last few days, she has had SOB, abdominal pain, difficulty swallowing water, back pain.  She had some n/v the other day.  She is unable to lie flat because she can't breathe.  She is having difficulty sleeping.  +cough, productive of clear/white sputum.  ?minimal hemoptysis.  She has had weakness in her legs.  No fevers.  She was hospitalized 1 month ago but has not really gotten back to her old self with her energy.  She continued to have leg weakness, SOB other than a few days after discharge.   She is not a smoker.   ED Course:  PNA, multifocal.  Admitted to cards 1 month ago with PNA and cardiac issues.  Dr. Sharyn Lull consulted and suggests admission to medicine.  Dr. Sharyn Lull is aware of 12-beat run of vtach, patient has AICD in place.  Treated with Rocephin monotherapy, but will need broadened coverage for HCAP given inpatient stay last month.  Review of Systems: As per HPI; otherwise review of systems reviewed and negative.   Ambulatory Status:  Ambulates with walker  Past Medical History:  Diagnosis Date  . AICD (automatic cardioverter/defibrillator) present    St. Jude Fortify ICD [phone]  . Arthritis    "hands, back" (04/07/2018)  . CAP (community acquired pneumonia) 04/07/2018  . 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 CHF (congestive heart failure) (HCC)    systolic  . Chronic lower back pain   . CKD  (chronic kidney disease), stage III (HCC)   . History of tobacco abuse   . HTN (hypertension)   . Hypercholesteremia   . Hypothyroidism   . Ischemic cardiomyopathy    severe  . MI (myocardial infarction) (HCC) 02/2009   Hx of inferior wall MI   . On home oxygen therapy    "2-4L prn" (04/07/2018)  . Pneumonia    "once before today" (04/07/2018)  . Type 2 diabetes mellitus (HCC)     Past Surgical History:  Procedure Laterality Date  . ABDOMINAL HYSTERECTOMY    . CARDIAC DEFIBRILLATOR PLACEMENT  2011   single chamber. St Jude Fortify ICD [id number]. Remote- no.   . CORONARY ANGIOPLASTY WITH STENT PLACEMENT  02/2009   [name] 02/07/2010    Social History   Socioeconomic History  . Marital status: Married    Spouse name: Not on file  . Number of children: Not on file  . Years of education: Not on file  . Highest education level: Not on file  Occupational History  . Occupation: retired  Engineer, production  . Financial resource strain: Not on file  . Food insecurity:    Worry: Not on file    Inability: Not on file  . Transportation needs:    Medical: Not on file    Non-medical: Not on file  Tobacco Use  . Smoking status: Former Smoker    Packs/day: 0.50    Years: 5.00    Pack years: 2.50  Types: Cigarettes    Last attempt to quit: 1995    Years since quitting: 24.7  . Smokeless tobacco: Never Used  Substance and Sexual Activity  . Alcohol use: Not Currently  . Drug use: Never  . Sexual activity: Not Currently  Lifestyle  . Physical activity:    Days per week: Not on file    Minutes per session: Not on file  . Stress: Not on file  Relationships  . Social connections:    Talks on phone: Not on file    Gets together: Not on file    Attends religious service: Not on file    Active member of club or organization: Not on file    Attends meetings of clubs or organizations: Not on file    Relationship status: Not on file  . Intimate partner violence:    Fear of current or ex  partner: Not on file    Emotionally abused: Not on file    Physically abused: Not on file    Forced sexual activity: Not on file  Other Topics Concern  . Not on file  Social History Narrative   Mar[place], retired Runner, broadcasting/film/videoteacher.      Allergies  Allergen Reactions  . Eggs Or Egg-Derived Products Nausea And Vomiting    Family History  Problem Relation Age of Onset  . Coronary artery disease Other        postive family Hx    Prior to Admission medications   Medication Sig Start Date End Date Taking? Authorizing Provider  aspirin EC 81 MG tablet Take 81 mg by mouth daily.    [provider]  azithromycin (ZITHROMAX) 500 MG tablet Take 1 tablet (500 mg total) by mouth daily at 6 PM. 04/11/18   Rinaldo CloudHarwani, Mohan, MD  carvedilol (COREG) 25 MG tablet Take 0.5 tablets (12.5 mg total) by mouth 2 (two) times daily. 06/29/16   Rinaldo CloudHarwani, Mohan, MD  furosemide (LASIX) 40 MG tablet Take 40 mg by mouth 2 (two) times daily.      [provider]  levothyroxine (SYNTHROID, LEVOTHROID) 175 MCG tablet Take 175 mcg by mouth daily before breakfast. 03/27/18   [provider]  nitroGLYCERIN (NITROSTAT) 0.4 MG SL tablet Place 1 tablet (0.4 mg total) under the tongue every 5 (five) minutes x 3 doses as needed for chest pain. 06/29/16   Rinaldo CloudHarwani, Mohan, MD  OXYGEN Inhale 2 L into the lungs continuous.    [provider]  rosuvastatin (CRESTOR) 20 MG tablet Take 0.5 tablets (10 mg total) by mouth daily. 04/11/18   Rinaldo CloudHarwani, Mohan, MD  sacubitril-valsartan (ENTRESTO) 97-103 MG Take 1 tablet by mouth 2 (two) times daily.    [provider]  spironolactone (ALDACTONE) 25 MG tablet Take 0.5 tablets (12.5 mg total) by mouth daily. 04/11/18   Rinaldo CloudHarwani, Mohan, MD    Physical Exam: Vitals:   05/17/18 1502 05/17/18 1505 05/17/18 1513 05/17/18 1515  BP:    113/69  Pulse: 88 91 91 94  Resp: 18 18 (!) 23 (!) 28  Temp:      TempSrc:      SpO2: 100% 100% 97% 98%      General:  Appears calm and comfortable and is NAD; intermittent coarse cough Eyes: PERRL, EOMI, normal lids, iris ENT:  grossly normal hearing, lips & tongue, mmm; poor dentition Neck:  no masses or thyromegaly; there is some fullness to suggest supraclavicular LAD Cardiovascular:  RRR, no m/r/g. No LE edema.  Respiratory:  LLL rhonchi, RLL diminished air movement, scattered wheezing.  Mildly increased respiratory effort. Abdomen:  soft, NT, ND, NABS Back:   normal alignment, no CVAT Skin:  no rash or induration seen on limited exam Musculoskeletal:  grossly normal tone BUE/BLE, good ROM, no bony abnormality Psychiatric:  grossly normal mood and affect, speech fluent and appropriate, AOx3 Neurologic:  CN 2-12 grossly intact, moves all extremities in coordinated fashion, sensation intact    Radiological Exams on Admission: Dg Chest 2 View  Result Date: 05/17/2018 CLINICAL DATA:  72 year old female with a history shortness of breath EXAM: CHEST - 2 VIEW COMPARISON:  04/11/2018 FINDINGS: Cardiomediastinal silhouette unchanged with cardiomegaly. Airspace opacity within the right lower lobe, with additional airspace opacity in the bilateral upper and left lower lungs. Blunting at the right costophrenic angle persists. No pneumothorax. Overall the degree of airspace opacity has worsened from the comparison. Unchanged cardiac AICD. IMPRESSION: Evidence of multifocal pneumonia, predominantly right-sided. Electronically Signed   By: Gilmer Mor D.O.   On: 05/17/2018 12:56    EKG: Independently reviewed.  NSR with rate 100; LBBB; nonspecific ST changes with no evidence of acute ischemia   Labs on Admission: I have personally reviewed the available labs and imaging studies at the time of the admission.  Pertinent labs:   CO2 21 Glucose 125 BUN 32/Creatinine 2.21/GFR 24 - baseline appears to be creatinine 1.9-2.0 Troponin 0.03 WBC 5.2 Hgb 11.9 - stable   Assessment/Plan Principal  Problem:   Multifocal pneumonia Active Problems:   HYPERTENSION, BENIGN   Chronic systolic congestive heart failure (HCC)   Hypothyroidism   Diabetes mellitus type 2 in nonobese (HCC)   Multifocal PNA -Given productive cough and multifocal infiltrates on CXR, this appears to be HCAP due to prior hospitalization within the last 90 days.  -CURB-65 score is 2 - will admit the patient to telemetry given known severe ischemic cardiomyopathy. -Pneumonia Severity Index (PSI) is Class 4, 9% mortality. -Corticosteroids have been to shown to low overall mortality rate; risk of ARDS; and need for mechanical ventilation.  This is particularly true in severe PNA (class 3+ PSI).  Will add 40 mg prednisone daily for 5 days. -The patient has the following criteria for MDR (multi-drug resistance): IV antibiotics in the last 90 days; >2 days of inpatient treatment within the last 90 days. -The patient will need treatment for HCAP due to MDR risk factors as above; will treat with Cefepime and Vancomycin. -Additional complicating factors include: chronic hypoxia requiring home O2. -Hold both IVF and Lasix given severe ICM. -Repeat CBC in am -Sputum cultures -Blood cultures -Strep pneumo testing -Will order lower respiratory tract procalcitonin level.  Antibiotics would not be indicated for PCT <0.1 and probably should not be used for < 0.25.  >0.5 indicates infection and >>0.5 indicates more serious disease.  As the procalcitonin level normalizes, it will be reasonable to consider de-escalation of antibiotic coverage. -albuterol PRN  HTN -Continue Coreg, ACE  Chronic CHF -Continue Entresto, BB, ASA, statin -Hold Lasix, as above -Dr. Sharyn Lull was consulted by the ER  DM -A1c was 5.9 on 8/9 -She does not appear to be taking any medications for this issue -Will add sensitive-scale SSI due to addition of steroids  Hypothyroidism -Check TSH and free T4 - she had a suppressed TSH on 8/9 -Continue  Synthroid at current dose for now    DVT prophylaxis:  Lovenox Code Status:  DNR - confirmed with patient/family Family Communication: Husband, sister present throughout evalaution  Disposition  Plan:  Home once clinically improved Consults called: Cardiology  Admission status: Admit - It is my clinical opinion that admission to INPATIENT is reasonable and necessary because of the expectation that this patient will require hospital care that crosses at least 2 midnights to treat this condition based on the medical complexity of the problems presented.  Given the aforementioned information, the predictability of an adverse outcome is felt to be significant.    Jonah Blue MD [facility]  If note is complete, please contact covering daytime or nighttime physician. [email or web address] Password [facility]  05/17/2018, 3:41 PM

## 2018-05-18 DIAGNOSIS — E119 Type 2 diabetes mellitus without complications: Secondary | ICD-10-CM

## 2018-05-18 DIAGNOSIS — J189 Pneumonia, unspecified organism: Principal | ICD-10-CM

## 2018-05-18 DIAGNOSIS — I10 Essential (primary) hypertension: Secondary | ICD-10-CM

## 2018-05-18 DIAGNOSIS — N179 Acute kidney failure, unspecified: Secondary | ICD-10-CM

## 2018-05-18 DIAGNOSIS — N183 Chronic kidney disease, stage 3 (moderate): Secondary | ICD-10-CM

## 2018-05-18 DIAGNOSIS — I5022 Chronic systolic (congestive) heart failure: Secondary | ICD-10-CM

## 2018-05-18 DIAGNOSIS — E1122 Type 2 diabetes mellitus with diabetic chronic kidney disease: Secondary | ICD-10-CM

## 2018-05-18 DIAGNOSIS — E039 Hypothyroidism, unspecified: Secondary | ICD-10-CM

## 2018-05-18 LAB — CBC WITH DIFFERENTIAL/PLATELET
Abs Immature Granulocytes: 0 10*3/uL (ref 0.0–0.1)
BASOS ABS: 0 10*3/uL (ref 0.0–0.1)
BASOS PCT: 1 %
EOS PCT: 1 %
Eosinophils Absolute: 0.1 10*3/uL (ref 0.0–0.7)
HCT: 37.4 % (ref 36.0–46.0)
HEMOGLOBIN: 11.9 g/dL — AB (ref 12.0–15.0)
Immature Granulocytes: 0 %
Lymphocytes Relative: 13 %
Lymphs Abs: 0.6 10*3/uL — ABNORMAL LOW (ref 0.7–4.0)
MCH: 29.2 pg (ref 26.0–34.0)
MCHC: 31.8 g/dL (ref 30.0–36.0)
MCV: 91.7 fL (ref 78.0–100.0)
MONO ABS: 0.1 10*3/uL (ref 0.1–1.0)
Monocytes Relative: 2 %
Neutro Abs: 3.7 10*3/uL (ref 1.7–7.7)
Neutrophils Relative %: 83 %
Platelets: 165 10*3/uL (ref 150–400)
RBC: 4.08 MIL/uL (ref 3.87–5.11)
RDW: 18.5 % — ABNORMAL HIGH (ref 11.5–15.5)
WBC: 4.5 10*3/uL (ref 4.0–10.5)

## 2018-05-18 LAB — BASIC METABOLIC PANEL
Anion gap: 18 — ABNORMAL HIGH (ref 5–15)
BUN: 42 mg/dL — AB (ref 8–23)
CALCIUM: 8.6 mg/dL — AB (ref 8.9–10.3)
CO2: 19 mmol/L — ABNORMAL LOW (ref 22–32)
Chloride: 103 mmol/L (ref 98–111)
Creatinine, Ser: 2.38 mg/dL — ABNORMAL HIGH (ref 0.44–1.00)
GFR calc Af Amer: 22 mL/min — ABNORMAL LOW (ref >60)
GFR, EST NON AFRICAN AMERICAN: 19 mL/min — AB (ref >60)
GLUCOSE: 144 mg/dL — AB (ref 70–99)
Potassium: 4.5 mmol/L (ref 3.5–5.1)
Sodium: 140 mmol/L (ref 135–145)

## 2018-05-18 LAB — GLUCOSE, CAPILLARY
GLUCOSE-CAPILLARY: 155 mg/dL — AB (ref 70–99)
GLUCOSE-CAPILLARY: 205 mg/dL — AB (ref 70–99)
Glucose-Capillary: 223 mg/dL — ABNORMAL HIGH (ref 70–99)
Glucose-Capillary: 193 mg/dL — ABNORMAL HIGH (ref 70–99)

## 2018-05-18 LAB — EXPECTORATED SPUTUM ASSESSMENT W REFEX TO RESP CULTURE

## 2018-05-18 LAB — EXPECTORATED SPUTUM ASSESSMENT W GRAM STAIN, RFLX TO RESP C

## 2018-05-18 MED ORDER — ADULT MULTIVITAMIN W/MINERALS CH
1.0000 | ORAL_TABLET | Freq: Every day | ORAL | Status: DC
  Administered 2018-05-18 – 2018-05-21 (×4): 1 via ORAL
  Filled 2018-05-18 (×4): qty 1

## 2018-05-18 MED ORDER — BUDESONIDE 0.5 MG/2ML IN SUSP
0.5000 mg | Freq: Two times a day (BID) | RESPIRATORY_TRACT | Status: DC
  Administered 2018-05-18 – 2018-05-21 (×7): 0.5 mg via RESPIRATORY_TRACT
  Filled 2018-05-18 (×7): qty 2

## 2018-05-18 MED ORDER — SODIUM CHLORIDE 0.9 % IV SOLN
INTRAVENOUS | Status: AC
  Administered 2018-05-18: 09:00:00 via INTRAVENOUS

## 2018-05-18 MED ORDER — AMOXICILLIN-POT CLAVULANATE 500-125 MG PO TABS
1.0000 | ORAL_TABLET | Freq: Two times a day (BID) | ORAL | Status: DC
  Administered 2018-05-18 – 2018-05-21 (×7): 500 mg via ORAL
  Filled 2018-05-18 (×7): qty 1

## 2018-05-18 MED ORDER — ACETAMINOPHEN 325 MG PO TABS
650.0000 mg | ORAL_TABLET | Freq: Four times a day (QID) | ORAL | Status: DC | PRN
  Administered 2018-05-18 – 2018-05-20 (×2): 650 mg via ORAL
  Filled 2018-05-18 (×2): qty 2

## 2018-05-18 MED ORDER — IPRATROPIUM-ALBUTEROL 0.5-2.5 (3) MG/3ML IN SOLN
3.0000 mL | Freq: Four times a day (QID) | RESPIRATORY_TRACT | Status: DC
  Administered 2018-05-18 – 2018-05-19 (×7): 3 mL via RESPIRATORY_TRACT
  Filled 2018-05-18 (×7): qty 3

## 2018-05-18 MED ORDER — GLUCERNA SHAKE PO LIQD
237.0000 mL | Freq: Two times a day (BID) | ORAL | Status: DC
  Administered 2018-05-18 – 2018-05-21 (×5): 237 mL via ORAL

## 2018-05-18 NOTE — Progress Notes (Signed)
Initial Nutrition Assessment  DOCUMENTATION CODES:   Not applicable  INTERVENTION:   -Glucerna Shake po BID, each supplement provides 220 kcal and 10 grams of protein -MVI with minerals daily  NUTRITION DIAGNOSIS:   Inadequate oral intake related to decreased appetite as evidenced by meal completion < 50%, per patient/family report, percent weight loss.  GOAL:   Patient will meet greater than or equal to 90% of their needs  MONITOR:   PO intake, Supplement acceptance, Labs, Weight trends, Skin, I & O's  REASON FOR ASSESSMENT:   Malnutrition Screening Tool    ASSESSMENT:   Crystal Duran is a 72 y.o. female with medical history significant of DM; CAD; prior cardiac arrest resulting from vfib with AICD placement; severe systolic CHF (EF 16-10%10-15% in 11/18) from ischemic cardiomyopathy; hypothyroidism; HTN; HLD; and stage 3 CKD presenting with CP/SOB. For the last few days, she has had SOB, abdominal pain, difficulty swallowing water, back pain.   Pt admitted with multifocal PNA.  Spoke with pt at bedside, who reports ongoing, progressive poor oral intake over the past 2 months. Pt reports she is typically consuming only 25-50% of her meals due to poor appetite. Pt typically consumes 3 meals per day (Breakfast: grits, eggs, and toast; Lunch/Dinner:meat, starch, and vegetable). Pt reports consuming about 50% of her breakfast meal.   Pt endorses weight loss as related to decreased PO's. Pt shares that her UBW is around 145#. Noted that pt has experienced a 9% wt loss over the past month, which is significant for time frame. However, suspect some weight changes related to fluid status changes from CHF.   Pt reports she tried supplementing intake with Ensure, but did not tolerate well. Discussed with pt importance of good meal and supplement intake to promote healing. Pt would like to try Glucerna supplements due to elevated CBGS.  Medications reviewed and include prednisone (which  may also contribute to elevated CBGS).   Last Hgb A1c: 5.9 (04/07/2018). PTA DM medications are none. Spoke with pt regarding DM control at home. Pt reports good control, CBGS are usually between 85-95. She shares he home care aide usually checks her CBGS, but they have not been checked in the past 2 weeks, due to home health aide's sickness. Pt concerned about elevated CBGS in hospital. RN and RD discussed stress response/acute illness impact of blood sugar levels.   Labs reviewed: CBGS: 155-161 (inpatient orders for glycemic control are ).   NUTRITION - FOCUSED PHYSICAL EXAM:    Most Recent Value  Orbital Region  No depletion  Upper Arm Region  No depletion  Thoracic and Lumbar Region  No depletion  Buccal Region  No depletion  Temple Region  No depletion  Clavicle Bone Region  No depletion  Clavicle and Acromion Bone Region  No depletion  Scapular Bone Region  No depletion  Dorsal Hand  Mild depletion  Patellar Region  Mild depletion  Anterior Thigh Region  Mild depletion  Posterior Calf Region  Mild depletion  Edema (RD Assessment)  Mild  Hair  Reviewed  Eyes  Reviewed  Mouth  Reviewed  Skin  Reviewed  Nails  Reviewed       Diet Order:   Diet Order            Diet heart healthy/carb modified Room service appropriate? Yes; Fluid consistency: Thin  Diet effective now              EDUCATION NEEDS:   Education needs have been addressed  Skin:  Skin Assessment: Reviewed RN Assessment  Last BM:  05/18/18  Height:   Ht Readings from Last 1 Encounters:  05/17/18 5' 4.5" (1.638 m)    Weight:   Wt Readings from Last 1 Encounters:  05/18/18 60.1 kg    Ideal Body Weight:  55.7 kg  BMI:  Body mass index is 22.38 kg/m.  Estimated Nutritional Needs:   Kcal:  1600-1800  Protein:  8-095 grams  Fluid:  1.6-1.8 L    Mayari Matus A. Mayford Knife, RD, LDN, CDE Pager: [phone] After hours Pager: [phone]

## 2018-05-18 NOTE — Plan of Care (Signed)
  Problem: Health Behavior/Discharge Planning: Goal: Ability to manage health-related needs will improve Outcome: Progressing   Problem: Clinical Measurements: Goal: Ability to maintain clinical measurements within normal limits will improve Outcome: Progressing   Problem: Clinical Measurements: Goal: Will remain free from infection Outcome: Progressing   Problem: Clinical Measurements: Goal: Respiratory complications will improve Outcome: Progressing   Problem: Clinical Measurements: Goal: Cardiovascular complication will be avoided Outcome: Progressing   

## 2018-05-18 NOTE — Progress Notes (Signed)
PROGRESS NOTE    Crystal Duran  ZOX:[id number] DOB: Mar 05, 1946 DOA: 05/17/2018 PCP: Fleet Contras, MD     Brief Narrative:  72 y.o. female with medical history significant of DM; CAD; prior cardiac arrest resulting from vfib with AICD placement; severe systolic CHF (EF 81-19% in 11/18) from ischemic cardiomyopathy; hypothyroidism; HTN; HLD; and stage 3 CKD presenting with CP/SOB. For the last few days, she has had SOB, abdominal pain, difficulty swallowing water, back pain.  She had some n/v the other day.  She is unable to lie flat because she can't breathe.  She is having difficulty sleeping.  +cough, productive of clear/white sputum.  ?minimal hemoptysis.  She has had weakness in her legs and expressed not feeling good. Patient also reported chills, but denies fever.  Assessment & Plan: 1-Multifocal pneumonia/HCAP -patient received IV broad spectrum antibiotics on admission  -will attempt transition to PO regimen. -continue nebulizer treatment -follow culture results -start pulmicort -continue oxygen supplementation -follow clinical response.  2-chronic resp failure -patient uses 2-3L [place] at home -stable overall; even still experiencing SOB on exertion, most likely from #1 -will continue oxygen supplementation  -start flutter valve  3-HYPERTENSION, BENIGN -stable -continue current antihypertensive regimen   4-acute kidney injury on chronic renal failure: patient with stage 3 CKD at baseline  -will continue holding diuretics -stop vancomycin and entresto -gentle IVF's for 12 hours -advise to maintain adequate PO intake/hydration -follow renal function trend -patient reported no dysuria or urinary retention symptoms.  5-Chronic systolic congestive heart failure (HCC) -overall compensated currently; in fact patient is slightly dehydrated -as mentioned above will hold entresto in presence of acute kidney injury  -continue holding diuretics -gentle IVF's for 12 hours  would be given -follow daily weights and strict intake and output -continue b-blocker. -last EF around 15%; patient is s/p AICD.  6-Hypothyroidism -continue synthroid   7-Diabetes mellitus type 2 in nonobese [facility]): with underlying nephropathy -continue SSI -patient following diet management as an outpatient.   8-HLD -continue statins  DVT prophylaxis: lovenox Code Status: DNR Family Communication: no family at bedside  Disposition Plan: monitor on telemetry for another 24 hours. Transition antibiotics to PO, continue steroids; start Pulmicort and continue Duoneb. Hold entresto and continue holding diuretics. Gentle IVF's. Follow renal function in am. Start flutter valve.  Consultants:   Cardiology (Dr. Sharyn Lull)  Procedures:   See below for x-ray reports   Antimicrobials:  Anti-infectives (From admission, onward)   Start     Dose/Rate Route Frequency Ordered Stop   05/18/18 1730  ceFEPIme (MAXIPIME) 1 g in sodium chloride 0.9 % 100 mL IVPB  Status:  Discontinued     1 g 200 mL/hr over 30 Minutes Intravenous Every 24 hours 05/17/18 1650 05/18/18 0801   05/18/18 1700  vancomycin (VANCOCIN) 500 mg in sodium chloride 0.9 % 100 mL IVPB  Status:  Discontinued     500 mg 100 mL/hr over 60 Minutes Intravenous Every 24 hours 05/17/18 1608 05/18/18 0801   05/18/18 1000  amoxicillin-clavulanate (AUGMENTIN) 500-125 MG per tablet 500 mg     1 tablet Oral 2 times daily 05/18/18 0801     05/17/18 1615  vancomycin (VANCOCIN) 1,250 mg in sodium chloride 0.9 % 250 mL IVPB     1,250 mg 166.7 mL/hr over 90 Minutes Intravenous  Once 05/17/18 1608 05/17/18 1924   05/17/18 1600  ceFEPIme (MAXIPIME) 1 g in sodium chloride 0.9 % 100 mL IVPB  Status:  Discontinued     1 g 200  mL/hr over 30 Minutes Intravenous Every 8 hours 05/17/18 1534 05/17/18 1650   05/17/18 1415  cefTRIAXone (ROCEPHIN) 1 g in sodium chloride 0.9 % 100 mL IVPB     1 g 200 mL/hr over 30 Minutes Intravenous  Once 05/17/18 1412  05/17/18 1553      Subjective: Afebrile, no CP no nausea, no vomiting. Reported breathing is slowly improving. Still experiencing SOB with exertion and ongoing productive coughing spells.   Objective: Vitals:   05/18/18 0058 05/18/18 0504 05/18/18 0804 05/18/18 0850  BP: 117/66 124/80 112/71   Pulse: 78 81 83   Resp: 19 19 20    Temp: 97.9 F (36.6 C) (!) 97.5 F (36.4 C) 97.7 F (36.5 C)   TempSrc: Oral Oral Oral   SpO2: 100% 98% 100% 100%  Weight:  60.1 kg    Height:        Intake/Output Summary (Last 24 hours) at 05/18/2018 0917 Last data filed at 05/18/2018 0804 Gross per 24 hour  Intake 840 ml  Output 306 ml  Net 534 ml   Filed Weights   05/17/18 1515 05/17/18 1551 05/18/18 0504  Weight: 59.6 kg 59.7 kg 60.1 kg    Examination: General exam: Alert, awake, oriented x 3. Reports feeling better. No CP, no nausea, no vomiting. Still feeling SOB with exertion and reported ongoing productive coughing spells. Patient is afebrile.  Respiratory system: fair air movement, no crackles, positive exp wheezing and diffuse rhonchi.  Cardiovascular system: rate controlled, no murmurs, no rubs, no gallops, no JVD. Gastrointestinal system: Abdomen is nondistended, soft and nontender. No organomegaly or masses felt. Normal bowel sounds heard. Central nervous system: Alert and oriented. No focal neurological deficits. Extremities: No Cyanosis, no clubbing, no edema.  Skin: No rashes, lesions or ulcers Psychiatry: Judgement and insight appear normal. Mood & affect appropriate.     Data Reviewed: I have personally reviewed following labs and imaging studies  CBC: Recent Labs  Lab 05/17/18 1212 05/18/18 0517  WBC 5.2 4.5  NEUTROABS  --  3.7  HGB 11.9* 11.9*  HCT 38.1 37.4  MCV 92.3 91.7  PLT 164 165   Basic Metabolic Panel: Recent Labs  Lab 05/17/18 1212 05/18/18 0517  NA 142 140  K 4.0 4.5  CL 104 103  CO2 21* 19*  GLUCOSE 125* 144*  BUN 32* 42*  CREATININE 2.21*  2.38*  CALCIUM 9.0 8.6*   GFR: Estimated Creatinine Clearance: 18.6 mL/min (A) (by C-G formula based on SCr of 2.38 mg/dL (H)).  CBG: Recent Labs  Lab 05/17/18 1613 05/17/18 2136 05/18/18 0740  GLUCAP 104* 161* 155*   Thyroid Function Tests: Recent Labs    05/17/18 1605  TSH 0.856  FREET4 2.00*   Urine analysis:    Component Value Date/Time   COLORURINE AMBER (A) 04/08/2018 1526   APPEARANCEUR HAZY (A) 04/08/2018 1526   LABSPEC 1.017 04/08/2018 1526   PHURINE 5.0 04/08/2018 1526   GLUCOSEU NEGATIVE 04/08/2018 1526   HGBUR NEGATIVE 04/08/2018 1526   BILIRUBINUR NEGATIVE 04/08/2018 1526   KETONESUR NEGATIVE 04/08/2018 1526   PROTEINUR 30 (A) 04/08/2018 1526   UROBILINOGEN 1.0 02/07/2010 2106   NITRITE NEGATIVE 04/08/2018 1526   LEUKOCYTESUR LARGE (A) 04/08/2018 1526   Radiology Studies: Dg Chest 2 View  Result Date: 05/17/2018 CLINICAL DATA:  72 year old female with a history shortness of breath EXAM: CHEST - 2 VIEW COMPARISON:  04/11/2018 FINDINGS: Cardiomediastinal silhouette unchanged with cardiomegaly. Airspace opacity within the right lower lobe, with additional airspace opacity in  the bilateral upper and left lower lungs. Blunting at the right costophrenic angle persists. No pneumothorax. Overall the degree of airspace opacity has worsened from the comparison. Unchanged cardiac AICD. IMPRESSION: Evidence of multifocal pneumonia, predominantly right-sided. Electronically Signed   By: Gilmer Mor D.O.   On: 05/17/2018 12:56   Ct Chest Wo Contrast  Result Date: 05/17/2018 CLINICAL DATA:  Chest pain and dyspnea worsening over the past 3 days. History of CHF and hypertension. EXAM: CT CHEST WITHOUT CONTRAST TECHNIQUE: Multidetector CT imaging of the chest was performed following the standard protocol without IV contrast. COMPARISON:  CXR 05/17/2018 FINDINGS: Cardiovascular: Cardiomegaly with left main and three-vessel coronary arteriosclerosis. Left-sided ICD device with  lead in the right ventricle is noted. No pericardial effusion. Mild atherosclerosis of the thoracic aorta without aneurysm. The unenhanced pulmonary vasculature is unremarkable. Conventional branch pattern of the great vessels with atherosclerotic origins. Mediastinum/Nodes: Midline trachea. Esophagus is unremarkable. Enlarged mediastinal lymph nodes along the lower paratracheal portions bilaterally measuring up to 19 mm short axis, nonspecific in etiology. Assessment for hilar adenopathy is limited due to lack of IV contrast. Lungs/Pleura: Bilateral right greater than left pleural effusions, small to moderate on the right and small on the left with adjacent atelectasis, peribronchial thickening and faint ground-glass opacities in the right upper and lower lobes possibly representing stigmata of CHF. Alveolitis/pneumonitis or hypoventilatory change might also account for contribute to this appearance. Upper Abdomen: Nonacute Musculoskeletal: No chest wall mass or suspicious bone lesions identified. IMPRESSION: 1. Cardiomegaly with left main and three-vessel coronary arteriosclerosis and aortic atherosclerosis. 2. Right greater than left pleural effusions with probable stigmata of CHF accounting for ground-glass opacities noted in the right upper and lower lobes. 3. Mild diffuse peribronchial thickening likely reflecting bronchitic change. 4. Mild mediastinal lymphadenopathy question reactive. Aortic Atherosclerosis (ICD10-I70.0). Electronically Signed   By: Tollie Eth M.D.   On: 05/17/2018 18:58    Scheduled Meds: . amoxicillin-clavulanate  1 tablet Oral BID  . aspirin EC  81 mg Oral Daily  . budesonide (PULMICORT) nebulizer solution  0.5 mg Nebulization BID  . carvedilol  12.5 mg Oral BID WC  . enoxaparin (LOVENOX) injection  30 mg Subcutaneous Q24H  . insulin aspart  0-9 Units Subcutaneous TID WC  . ipratropium-albuterol  3 mL Nebulization Q6H  . levothyroxine  175 mcg Oral QAC breakfast  . predniSONE   40 mg Oral Q breakfast  . rosuvastatin  10 mg Oral Daily  . sodium chloride flush  3 mL Intravenous Q12H   Continuous Infusions: . sodium chloride    . sodium chloride 50 mL/hr at 05/18/18 0843     LOS: 1 day    Time spent: 30 minutes.    Vassie Loll, MD [facility] Pager [phone]  If 7PM-7AM, please contact night-coverage [email or web address] Password [facility] 05/18/2018, 9:17 AM

## 2018-05-19 ENCOUNTER — Other Ambulatory Visit: Payer: Self-pay

## 2018-05-19 LAB — BASIC METABOLIC PANEL
ANION GAP: 15 (ref 5–15)
BUN: 58 mg/dL — AB (ref 8–23)
CO2: 21 mmol/L — AB (ref 22–32)
Calcium: 8 mg/dL — ABNORMAL LOW (ref 8.9–10.3)
Chloride: 99 mmol/L (ref 98–111)
Creatinine, Ser: 2.87 mg/dL — ABNORMAL HIGH (ref 0.44–1.00)
GFR calc Af Amer: 18 mL/min — ABNORMAL LOW (ref >60)
GFR calc non Af Amer: 15 mL/min — ABNORMAL LOW (ref >60)
GLUCOSE: 145 mg/dL — AB (ref 70–99)
Potassium: 5.6 mmol/L — ABNORMAL HIGH (ref 3.5–5.1)
SODIUM: 135 mmol/L (ref 135–145)

## 2018-05-19 LAB — GLUCOSE, CAPILLARY
GLUCOSE-CAPILLARY: 167 mg/dL — AB (ref 70–99)
Glucose-Capillary: 125 mg/dL — ABNORMAL HIGH (ref 70–99)
Glucose-Capillary: 200 mg/dL — ABNORMAL HIGH (ref 70–99)
Glucose-Capillary: 222 mg/dL — ABNORMAL HIGH (ref 70–99)

## 2018-05-19 NOTE — Plan of Care (Signed)
  Problem: Clinical Measurements: Goal: Respiratory complications will improve Outcome: Progressing   Problem: Nutrition: Goal: Adequate nutrition will be maintained Outcome: Progressing   Problem: Pain Managment: Goal: General experience of comfort will improve Outcome: Progressing    

## 2018-05-19 NOTE — Evaluation (Signed)
Physical Therapy Evaluation Patient Details Name: Crystal Duran MRN: [id number] DOB: 03-Sep-1945 Today's Date: 05/19/2018   History of Present Illness  Pt is 72 y.o. female admitted 05/17/18 with SOB and chest pain; worked up for multifocal pneumonia/HCAP. PMH includes DM, CAD, AICD, CHF, HTN, CKD3.    Clinical Impression  Patient evaluated by Physical Therapy with no further acute PT needs identified. PTA, pt mod indep with RW; lives with husband available for support. Today, pt ambulating with RW at supervision-level. Educ on importance of energy conservation. All education has been completed and the patient has no further questions. See below for any follow-up Physical Therapy or equipment needs. PT is signing off. Thank you for this referral.    Follow Up Recommendations Home health PT;Supervision for mobility/OOB    Equipment Recommendations  3in1 (PT)    Recommendations for Other Services       Precautions / Restrictions Precautions Precautions: Fall Restrictions Weight Bearing Restrictions: No      Mobility  Bed Mobility Overal bed mobility: Independent                Transfers Overall transfer level: Needs assistance Equipment used: Rolling walker (2 wheeled);None Transfers: Sit to/from Stand Sit to Stand: Supervision            Ambulation/Gait Ambulation/Gait assistance: Supervision Gait Distance (Feet): 150 Feet Assistive device: Rolling walker (2 wheeled) Gait Pattern/deviations: Step-through pattern;Decreased stride length;Trunk flexed Gait velocity: Decreased Gait velocity interpretation: 1.31 - 2.62 ft/sec, indicative of limited community ambulator General Gait Details: Required multiple standing rest breaks secondary to fatigue and DOE 2/4. Intermittently running into objects on ground, likely related to increased fatigue. Able to self-correct  Stairs            Wheelchair Mobility    Modified Rankin (Stroke Patients Only)        Balance Overall balance assessment: Needs assistance   Sitting balance-Leahy Scale: Good       Standing balance-Leahy Scale: Fair                               Pertinent Vitals/Pain Pain Assessment: No/denies pain    Home Living Family/patient expects to be discharged to:: Private residence Living Arrangements: Spouse/significant other Available Help at Discharge: Family;Available 24 hours/day Type of Home: House Home Access: Stairs to enter Entrance Stairs-Rails: Right Entrance Stairs-Number of Steps: 3 (in back) Home Layout: One level Home Equipment: Shower seat;Cane - single point;Walker - 2 wheels      Prior Function Level of Independence: Independent with assistive device(s)         Comments: Mod indep with RW. Drives. Uses scooter at grocery store.     Hand Dominance        Extremity/Trunk Assessment   Upper Extremity Assessment Upper Extremity Assessment: Overall WFL for tasks assessed    Lower Extremity Assessment Lower Extremity Assessment: Generalized weakness       Communication   Communication: No difficulties  Cognition Arousal/Alertness: Awake/alert Behavior During Therapy: WFL for tasks assessed/performed Overall Cognitive Status: Within Functional Limits for tasks assessed                                        General Comments      Exercises     Assessment/Plan    PT Assessment Patient needs continued PT services;All  further PT needs can be met in the next venue of care  PT Problem List Decreased strength;Decreased activity tolerance;Decreased balance;Decreased mobility;Decreased knowledge of use of DME;Cardiopulmonary status limiting activity       PT Treatment Interventions      PT Goals (Current goals can be found in the Care Plan section)  Acute Rehab PT Goals PT Goal Formulation: All assessment and education complete, DC therapy    Frequency     Barriers to discharge         Co-evaluation               AM-PAC PT "6 Clicks" Daily Activity  Outcome Measure Difficulty turning over in bed (including adjusting bedclothes, sheets and blankets)?: None Difficulty moving from lying on back to sitting on the side of the bed? : None Difficulty sitting down on and standing up from a chair with arms (e.g., wheelchair, bedside commode, etc,.)?: A Little Help needed moving to and from a bed to chair (including a wheelchair)?: A Little Help needed walking in hospital room?: A Little Help needed climbing 3-5 steps with a railing? : A Little 6 Click Score: 20    End of Session Equipment Utilized During Treatment: Gait belt Activity Tolerance: Patient tolerated treatment well Patient left: in chair;with call bell/phone within reach Nurse Communication: Mobility status PT Visit Diagnosis: Other abnormalities of gait and mobility (R26.89)    Time: 4098-1191 PT Time Calculation (min) (ACUTE ONLY): 29 min   Charges:   PT Evaluation $PT Eval Moderate Complexity: 1 Mod PT Treatments $Gait Training: 8-22 mins       Mabeline Caras, PT, DPT Acute Rehabilitation Services  Pager [phone] Office [place] 05/19/2018, 5:15 PM

## 2018-05-19 NOTE — Progress Notes (Signed)
Verbal order given for cardiac monitoring for the next 24 gr by MD Gwenlyn PerkingMadera

## 2018-05-19 NOTE — Care Management Note (Signed)
Case Management Note  Patient Details  Name: Crystal Duran MRN: [id number] Date of Birth: Mar 25, 1946  Subjective/Objective:   Pneumonia                Action/Plan: Patient lives at home with spouse;PCP: Fleet ContrasAvbuere, Edwin, MD; has private insurance with [employer] with prescription drug coverage; pharmacy of choice is [facility]; is active with [facility] as prior to admission; DME- Walker, cane; patient is requesting a 3:1 at discharge; CM will continue to follow for progression of care.  Expected Discharge Date:    possibly 05/22/2018              Expected Discharge Plan:  Home w Home Health Services  Discharge planning Services  CM Consult    Choice offered to:  Patient  HH Arranged:  RN, PT, OT, Nurse's Aide HH Agency:  [facility]  Status of Service:  In process, will continue to follow  Reola MosherChandler, Aaliyan Brinkmeier L, RN,MHA,BSN [phone] 05/19/2018, 3:29 PM

## 2018-05-19 NOTE — Progress Notes (Signed)
Patient informed me that she is considering changing her DNR code status. Paged MD Gwenlyn PerkingMadera and informed him. MD will come and discuss options with the patient. Patient informed and agreeable.  Zaraya Delauder, RN

## 2018-05-19 NOTE — Progress Notes (Signed)
PROGRESS NOTE    RAYVIN ABID  MVH:[id number] DOB: May 12, 1946 DOA: 05/17/2018 PCP: Fleet Contras, MD    Brief Narrative:  72 y.o. female with medical history significant of DM; CAD; prior cardiac arrest resulting from vfib with AICD placement; severe systolic CHF (EF 84-13% in 11/18) from ischemic cardiomyopathy; hypothyroidism; HTN; HLD; and stage 3 CKD presenting with CP/SOB. For the last few days, she has had SOB, abdominal pain, difficulty swallowing water, back pain.  She had some n/v the other day.  She is unable to lie flat because she can't breathe.  She is having difficulty sleeping.  +cough, productive of clear/white sputum.  ?minimal hemoptysis.  She has had weakness in her legs and expressed not feeling good. Patient also reported chills, but denies fever.  Assessment & Plan: 1-Multifocal pneumonia/HCAP -patient received IV broad spectrum antibiotics on admission  -continue PO antibiotics. -continue Duoneb nebulizer treatment -follow culture results; so far no growth. -continue pulmicort -continue oxygen supplementation -follow clinical response; overall improved..  2-chronic resp failure -patient uses 2-3L [place] at home -stable overall; even still experiencing SOB on exertion, most likely from #1 -will continue oxygen supplementation  -continue flutter valve  3-HYPERTENSION, BENIGN -stable -continue current antihypertensive regimen   4-acute kidney injury on chronic renal failure: patient with stage 3 CKD at baseline  -will continue holding diuretics and entresto -minimize nephrotoxic agents -advise to maintain adequate PO intake/hydration -follow renal function trend -Cr up to 2.87 -patient reported no dysuria or urinary retention symptoms. -once Cr improved , patient would be stable for discharge.   5-Chronic systolic congestive heart failure (HCC) -overall compensated currently -continue holding diuretics and entresto. -follow daily weights and strict  intake and output -continue b-blocker. -last EF around 15%; patient is s/p AICD.  6-Hypothyroidism -continue synthroid   7-Diabetes mellitus type 2 in nonobese [facility]): with underlying nephropathy -continue SSI -patient following diet management as an outpatient.   8-HLD -continue statins  9-hyperkalemia -slight hemolysis reported  -will repeat BMET -continue to monitor on telemetry.  DVT prophylaxis: lovenox Code Status: DNR Family Communication: no family at bedside  Disposition Plan: monitor on telemetry for another 24 hours, especially with findings of abnormal electrolytes.  Continue oral antibiotics, continue steroids and nebulizer therapy. Continue flutter valve and follow renal function trend.  Hopefully discharge in the next 24 hours if his renal function started trending down.  Consultants:   Cardiology (Dr. Sharyn Lull)  Procedures:   See below for x-ray reports   Antimicrobials:  Anti-infectives (From admission, onward)   Start     Dose/Rate Route Frequency Ordered Stop   05/18/18 1730  ceFEPIme (MAXIPIME) 1 g in sodium chloride 0.9 % 100 mL IVPB  Status:  Discontinued     1 g 200 mL/hr over 30 Minutes Intravenous Every 24 hours 05/17/18 1650 05/18/18 0801   05/18/18 1700  vancomycin (VANCOCIN) 500 mg in sodium chloride 0.9 % 100 mL IVPB  Status:  Discontinued     500 mg 100 mL/hr over 60 Minutes Intravenous Every 24 hours 05/17/18 1608 05/18/18 0801   05/18/18 1000  amoxicillin-clavulanate (AUGMENTIN) 500-125 MG per tablet 500 mg     1 tablet Oral 2 times daily 05/18/18 0801     05/17/18 1615  vancomycin (VANCOCIN) 1,250 mg in sodium chloride 0.9 % 250 mL IVPB     1,250 mg 166.7 mL/hr over 90 Minutes Intravenous  Once 05/17/18 1608 05/17/18 1924   05/17/18 1600  ceFEPIme (MAXIPIME) 1 g in sodium chloride 0.9 %  100 mL IVPB  Status:  Discontinued     1 g 200 mL/hr over 30 Minutes Intravenous Every 8 hours 05/17/18 1534 05/17/18 1650   05/17/18 1415  cefTRIAXone  (ROCEPHIN) 1 g in sodium chloride 0.9 % 100 mL IVPB     1 g 200 mL/hr over 30 Minutes Intravenous  Once 05/17/18 1412 05/17/18 1553      Subjective: Afebrile, no chest pain, no nausea, no vomiting.  Overall feeling better and breathing easier.   Objective: Vitals:   05/19/18 0541 05/19/18 0926 05/19/18 0928 05/19/18 0955  BP: 114/77   105/67  Pulse: 84   82  Resp: 16     Temp: 98 F (36.7 C)     TempSrc:      SpO2: 100% 99% 99% 100%  Weight: 62.2 kg     Height:        Intake/Output Summary (Last 24 hours) at 05/19/2018 1037 Last data filed at 05/19/2018 0959 Gross per 24 hour  Intake 1123 ml  Output 250 ml  Net 873 ml   Filed Weights   05/17/18 1551 05/18/18 0504 05/19/18 0541  Weight: 59.7 kg 60.1 kg 62.2 kg    Examination: General exam: Alert, awake, oriented x 3; patient reports feeling much better.  No chest pain, no palpitations, no nausea, no vomiting.  Reports breathing continued to improve. She is afebrile. Appetite also improving.  Respiratory system: Improved from movement bilaterally, no crackles, mild expiratory wheezing and diffuse rhonchi appreciated on exam.  Patient continue wearing chronic 2 L nasal cannula supplementation with good oxygen saturation. Cardiovascular system:rate controlled, no rubs, no gallops, no JVD. Gastrointestinal system: Abdomen is nondistended, soft and nontender. No organomegaly or masses felt. Normal bowel sounds heard. Central nervous system: Alert and oriented. No focal neurological deficits. Extremities: No Cyanosis, no clubbing. No edema on exam.  Skin: No rashes, lesions or ulcers Psychiatry: Judgement and insight appear normal. Mood & affect appropriate.    Data Reviewed: I have personally reviewed following labs and imaging studies  CBC: Recent Labs  Lab 05/17/18 1212 05/18/18 0517  WBC 5.2 4.5  NEUTROABS  --  3.7  HGB 11.9* 11.9*  HCT 38.1 37.4  MCV 92.3 91.7  PLT 164 165   Basic Metabolic Panel: Recent Labs    Lab 05/17/18 1212 05/18/18 0517 05/19/18 0645  NA 142 140 135  K 4.0 4.5 5.6*  CL 104 103 99  CO2 21* 19* 21*  GLUCOSE 125* 144* 145*  BUN 32* 42* 58*  CREATININE 2.21* 2.38* 2.87*  CALCIUM 9.0 8.6* 8.0*   GFR: Estimated Creatinine Clearance: 15.4 mL/min (A) (by C-G formula based on SCr of 2.87 mg/dL (H)).  CBG: Recent Labs  Lab 05/18/18 0740 05/18/18 1131 05/18/18 1641 05/18/18 2058 05/19/18 0836  GLUCAP 155* 223* 193* 205* 125*   Thyroid Function Tests: Recent Labs    05/17/18 1605  TSH 0.856  FREET4 2.00*   Urine analysis:    Component Value Date/Time   COLORURINE AMBER (A) 04/08/2018 1526   APPEARANCEUR HAZY (A) 04/08/2018 1526   LABSPEC 1.017 04/08/2018 1526   PHURINE 5.0 04/08/2018 1526   GLUCOSEU NEGATIVE 04/08/2018 1526   HGBUR NEGATIVE 04/08/2018 1526   BILIRUBINUR NEGATIVE 04/08/2018 1526   KETONESUR NEGATIVE 04/08/2018 1526   PROTEINUR 30 (A) 04/08/2018 1526   UROBILINOGEN 1.0 02/07/2010 2106   NITRITE NEGATIVE 04/08/2018 1526   LEUKOCYTESUR LARGE (A) 04/08/2018 1526   Radiology Studies: Dg Chest 2 View  Result Date: 05/17/2018 CLINICAL  DATA:  72 year old female with a history shortness of breath EXAM: CHEST - 2 VIEW COMPARISON:  04/11/2018 FINDINGS: Cardiomediastinal silhouette unchanged with cardiomegaly. Airspace opacity within the right lower lobe, with additional airspace opacity in the bilateral upper and left lower lungs. Blunting at the right costophrenic angle persists. No pneumothorax. Overall the degree of airspace opacity has worsened from the comparison. Unchanged cardiac AICD. IMPRESSION: Evidence of multifocal pneumonia, predominantly right-sided. Electronically Signed   By: Gilmer Mor D.O.   On: 05/17/2018 12:56   Ct Chest Wo Contrast  Result Date: 05/17/2018 CLINICAL DATA:  Chest pain and dyspnea worsening over the past 3 days. History of CHF and hypertension. EXAM: CT CHEST WITHOUT CONTRAST TECHNIQUE: Multidetector CT imaging  of the chest was performed following the standard protocol without IV contrast. COMPARISON:  CXR 05/17/2018 FINDINGS: Cardiovascular: Cardiomegaly with left main and three-vessel coronary arteriosclerosis. Left-sided ICD device with lead in the right ventricle is noted. No pericardial effusion. Mild atherosclerosis of the thoracic aorta without aneurysm. The unenhanced pulmonary vasculature is unremarkable. Conventional branch pattern of the great vessels with atherosclerotic origins. Mediastinum/Nodes: Midline trachea. Esophagus is unremarkable. Enlarged mediastinal lymph nodes along the lower paratracheal portions bilaterally measuring up to 19 mm short axis, nonspecific in etiology. Assessment for hilar adenopathy is limited due to lack of IV contrast. Lungs/Pleura: Bilateral right greater than left pleural effusions, small to moderate on the right and small on the left with adjacent atelectasis, peribronchial thickening and faint ground-glass opacities in the right upper and lower lobes possibly representing stigmata of CHF. Alveolitis/pneumonitis or hypoventilatory change might also account for contribute to this appearance. Upper Abdomen: Nonacute Musculoskeletal: No chest wall mass or suspicious bone lesions identified. IMPRESSION: 1. Cardiomegaly with left main and three-vessel coronary arteriosclerosis and aortic atherosclerosis. 2. Right greater than left pleural effusions with probable stigmata of CHF accounting for ground-glass opacities noted in the right upper and lower lobes. 3. Mild diffuse peribronchial thickening likely reflecting bronchitic change. 4. Mild mediastinal lymphadenopathy question reactive. Aortic Atherosclerosis (ICD10-I70.0). Electronically Signed   By: Tollie Eth M.D.   On: 05/17/2018 18:58    Scheduled Meds: . amoxicillin-clavulanate  1 tablet Oral BID  . aspirin EC  81 mg Oral Daily  . budesonide (PULMICORT) nebulizer solution  0.5 mg Nebulization BID  . carvedilol  12.5  mg Oral BID WC  . enoxaparin (LOVENOX) injection  30 mg Subcutaneous Q24H  . feeding supplement (GLUCERNA SHAKE)  237 mL Oral BID BM  . insulin aspart  0-9 Units Subcutaneous TID WC  . ipratropium-albuterol  3 mL Nebulization Q6H  . levothyroxine  175 mcg Oral QAC breakfast  . multivitamin with minerals  1 tablet Oral Daily  . predniSONE  40 mg Oral Q breakfast  . rosuvastatin  10 mg Oral Daily  . sodium chloride flush  3 mL Intravenous Q12H   Continuous Infusions: . sodium chloride       LOS: 2 days    Time spent: 30 minutes.    Vassie Loll, MD [facility] Pager [phone]  If 7PM-7AM, please contact night-coverage [email or web address] Password [facility] 05/19/2018, 10:37 AM

## 2018-05-20 LAB — GLUCOSE, CAPILLARY
GLUCOSE-CAPILLARY: 157 mg/dL — AB (ref 70–99)
GLUCOSE-CAPILLARY: 157 mg/dL — AB (ref 70–99)
Glucose-Capillary: 167 mg/dL — ABNORMAL HIGH (ref 70–99)
Glucose-Capillary: 190 mg/dL — ABNORMAL HIGH (ref 70–99)

## 2018-05-20 LAB — BASIC METABOLIC PANEL
Anion gap: 16 — ABNORMAL HIGH (ref 5–15)
BUN: 63 mg/dL — AB (ref 8–23)
CALCIUM: 8.1 mg/dL — AB (ref 8.9–10.3)
CO2: 20 mmol/L — ABNORMAL LOW (ref 22–32)
CREATININE: 2.54 mg/dL — AB (ref 0.44–1.00)
Chloride: 98 mmol/L (ref 98–111)
GFR calc Af Amer: 20 mL/min — ABNORMAL LOW (ref >60)
GFR, EST NON AFRICAN AMERICAN: 18 mL/min — AB (ref >60)
GLUCOSE: 168 mg/dL — AB (ref 70–99)
Potassium: 4.8 mmol/L (ref 3.5–5.1)
SODIUM: 134 mmol/L — AB (ref 135–145)

## 2018-05-20 MED ORDER — IPRATROPIUM-ALBUTEROL 0.5-2.5 (3) MG/3ML IN SOLN
3.0000 mL | Freq: Two times a day (BID) | RESPIRATORY_TRACT | Status: DC
  Administered 2018-05-20 – 2018-05-21 (×2): 3 mL via RESPIRATORY_TRACT
  Filled 2018-05-20 (×2): qty 3

## 2018-05-20 MED ORDER — IPRATROPIUM-ALBUTEROL 0.5-2.5 (3) MG/3ML IN SOLN
3.0000 mL | Freq: Three times a day (TID) | RESPIRATORY_TRACT | Status: DC
  Administered 2018-05-20: 3 mL via RESPIRATORY_TRACT
  Filled 2018-05-20: qty 3

## 2018-05-20 MED ORDER — ALBUTEROL SULFATE (2.5 MG/3ML) 0.083% IN NEBU: 2.5000 mg | INHALATION_SOLUTION | RESPIRATORY_TRACT | Status: DC | PRN

## 2018-05-20 NOTE — Progress Notes (Signed)
PROGRESS NOTE    Crystal SpellerLucille B Duran  AOZ:[id number] DOB: February 14, 1946 DOA: 05/17/2018 PCP: Fleet ContrasAvbuere, Edwin, MD    Brief Narrative:  72 y.o. female with medical history significant of DM; CAD; prior cardiac arrest resulting from vfib with AICD placement; severe systolic CHF (EF 96-29%10-15% in 11/18) from ischemic cardiomyopathy; hypothyroidism; HTN; HLD; and stage 3 CKD presenting with CP/SOB. For the last few days, she has had SOB, abdominal pain, difficulty swallowing water, back pain.  She had some n/v the other day.  She is unable to lie flat because she can't breathe.  She is having difficulty sleeping.  +cough, productive of clear/white sputum.  ?minimal hemoptysis.  She has had weakness in her legs and expressed not feeling good. Patient also reported chills, but denies fever.  Assessment & Plan: 1-Multifocal pneumonia/HCAP -patient received IV broad spectrum antibiotics on admission  -continue PO antibiotics. -continue Duoneb nebulizer treatment -follow culture results; so far no growth. -continue pulmicort -continue oxygen supplementation -follow clinical response; overall improved..  2-chronic resp failure -patient uses 2-3L [place] at home -stable overall; even still experiencing SOB on exertion, most likely from #1 -will continue oxygen supplementation  -continue flutter valve  3-HYPERTENSION, BENIGN -stable -continue current antihypertensive regimen   4-acute kidney injury on chronic renal failure: patient with stage 3 CKD at baseline  -will continue holding diuretics and entresto -minimize nephrotoxic agents -advise to maintain adequate PO intake/hydration -follow renal function trend -Cr started to trended down; today 2.5 -patient reported no dysuria or urinary retention symptoms. -once Cr demonstrate further improvement patient would be stable for discharge.   5-Chronic systolic congestive heart failure (HCC) -overall compensated currently -continue holding diuretics and  entresto. -follow daily weights and strict intake and output -continue b-blocker. -last EF around 15%; patient is s/p AICD.  6-Hypothyroidism -continue synthroid   7-Diabetes mellitus type 2 in nonobese [facility](HCC): with underlying nephropathy -continue SSI -patient following diet management as an outpatient.   8-HLD -continue statins  9-hyperkalemia -Most likely associated with hemolysis. -Repeat basic metabolic panel demonstrating normal potassium level. -At this moment will be safe to discontinue telemetry.  DVT prophylaxis: lovenox Code Status: DNR Family Communication: no family at bedside  Disposition Plan: monitor on telemetry for another 24 hours, especially with findings of abnormal electrolytes.  Continue oral antibiotics, continue steroids and nebulizer therapy. Continue flutter valve and follow renal function trend; creatinine finally improving and just waiting for a steady resolve impulsive prior to discharge.  Most likely home in a.m.    Consultants:   Cardiology (Dr. Sharyn LullHarwani)  Procedures:   See below for x-ray reports   Antimicrobials:  Anti-infectives (From admission, onward)   Start     Dose/Rate Route Frequency Ordered Stop   05/18/18 1730  ceFEPIme (MAXIPIME) 1 g in sodium chloride 0.9 % 100 mL IVPB  Status:  Discontinued     1 g 200 mL/hr over 30 Minutes Intravenous Every 24 hours 05/17/18 1650 05/18/18 0801   05/18/18 1700  vancomycin (VANCOCIN) 500 mg in sodium chloride 0.9 % 100 mL IVPB  Status:  Discontinued     500 mg 100 mL/hr over 60 Minutes Intravenous Every 24 hours 05/17/18 1608 05/18/18 0801   05/18/18 1000  amoxicillin-clavulanate (AUGMENTIN) 500-125 MG per tablet 500 mg     1 tablet Oral 2 times daily 05/18/18 0801     05/17/18 1615  vancomycin (VANCOCIN) 1,250 mg in sodium chloride 0.9 % 250 mL IVPB     1,250 mg 166.7 mL/hr over 90 Minutes  Intravenous  Once 05/17/18 1608 05/17/18 1924   05/17/18 1600  ceFEPIme (MAXIPIME) 1 g in sodium  chloride 0.9 % 100 mL IVPB  Status:  Discontinued     1 g 200 mL/hr over 30 Minutes Intravenous Every 8 hours 05/17/18 1534 05/17/18 1650   05/17/18 1415  cefTRIAXone (ROCEPHIN) 1 g in sodium chloride 0.9 % 100 mL IVPB     1 g 200 mL/hr over 30 Minutes Intravenous  Once 05/17/18 1412 05/17/18 1553      Subjective: No fever, no chest pain, no nausea, no vomiting.  Orbital feeling much better, reports that her breathing is essentially at baseline.  Appetite and oral intake has also improved.  No acute distress.  Objective: Vitals:   05/19/18 1956 05/20/18 0527 05/20/18 0738 05/20/18 0739  BP: 106/68 122/72    Pulse: 71 77    Resp: 19 19    Temp: 97.8 F (36.6 C) (!) 97.5 F (36.4 C)    TempSrc: Oral Oral    SpO2: 100% 98% 98% 98%  Weight:  63.6 kg    Height:        Intake/Output Summary (Last 24 hours) at 05/20/2018 1226 Last data filed at 05/20/2018 1610 Gross per 24 hour  Intake 720 ml  Output 200 ml  Net 520 ml   Filed Weights   05/18/18 0504 05/19/18 0541 05/20/18 0527  Weight: 60.1 kg 62.2 kg 63.6 kg    Examination: General exam: Alert, awake, oriented x 3; patient reports feeling much better.  No chest pain, no palpitations, no nausea, no vomiting.  Reports appetite and oral intake has improved.  She is afebrile and in no acute distress. Respiratory system: Improved air movement bilaterally, no crackles on exam; currently without any wheezing.  Positive rhonchi appreciated.  Good oxygen saturation while wearing  2 L nasal cannula supplementation. Cardiovascular system: Rate controlled. No rubs, gallops or JVD. Gastrointestinal system: Abdomen is nondistended, soft and nontender. No organomegaly or masses felt. Normal bowel sounds heard. Central nervous system: Alert and oriented. No focal neurological deficits. Extremities: No cyanosis, no clubbing; no edema. Skin: No rashes, lesions or ulcers Psychiatry: Judgement and insight appear normal. Mood & affect appropriate.     Data Reviewed: I have personally reviewed following labs and imaging studies  CBC: Recent Labs  Lab 05/17/18 1212 05/18/18 0517  WBC 5.2 4.5  NEUTROABS  --  3.7  HGB 11.9* 11.9*  HCT 38.1 37.4  MCV 92.3 91.7  PLT 164 165   Basic Metabolic Panel: Recent Labs  Lab 05/17/18 1212 05/18/18 0517 05/19/18 0645 05/20/18 0601  NA 142 140 135 134*  K 4.0 4.5 5.6* 4.8  CL 104 103 99 98  CO2 21* 19* 21* 20*  GLUCOSE 125* 144* 145* 168*  BUN 32* 42* 58* 63*  CREATININE 2.21* 2.38* 2.87* 2.54*  CALCIUM 9.0 8.6* 8.0* 8.1*   GFR: Estimated Creatinine Clearance: 17.4 mL/min (A) (by C-G formula based on SCr of 2.54 mg/dL (H)).  CBG: Recent Labs  Lab 05/19/18 0836 05/19/18 1228 05/19/18 1836 05/19/18 2135 05/20/18 0846  GLUCAP 125* 167* 200* 222* 167*   Thyroid Function Tests: Recent Labs    05/17/18 1605  TSH 0.856  FREET4 2.00*   Urine analysis:    Component Value Date/Time   COLORURINE AMBER (A) 04/08/2018 1526   APPEARANCEUR HAZY (A) 04/08/2018 1526   LABSPEC 1.017 04/08/2018 1526   PHURINE 5.0 04/08/2018 1526   GLUCOSEU NEGATIVE 04/08/2018 1526   HGBUR NEGATIVE 04/08/2018  1526   BILIRUBINUR NEGATIVE 04/08/2018 1526   KETONESUR NEGATIVE 04/08/2018 1526   PROTEINUR 30 (A) 04/08/2018 1526   UROBILINOGEN 1.0 02/07/2010 2106   NITRITE NEGATIVE 04/08/2018 1526   LEUKOCYTESUR LARGE (A) 04/08/2018 1526   Radiology Studies: No results found.  Scheduled Meds: . amoxicillin-clavulanate  1 tablet Oral BID  . aspirin EC  81 mg Oral Daily  . budesonide (PULMICORT) nebulizer solution  0.5 mg Nebulization BID  . carvedilol  12.5 mg Oral BID WC  . enoxaparin (LOVENOX) injection  30 mg Subcutaneous Q24H  . feeding supplement (GLUCERNA SHAKE)  237 mL Oral BID BM  . insulin aspart  0-9 Units Subcutaneous TID WC  . ipratropium-albuterol  3 mL Nebulization BID  . levothyroxine  175 mcg Oral QAC breakfast  . multivitamin with minerals  1 tablet Oral Daily  .  predniSONE  40 mg Oral Q breakfast  . rosuvastatin  10 mg Oral Daily  . sodium chloride flush  3 mL Intravenous Q12H   Continuous Infusions: . sodium chloride       LOS: 3 days    Time spent: 30 minutes.    Vassie Loll, MD [facility] Pager [phone]  If 7PM-7AM, please contact night-coverage [email or web address] Password [facility] 05/20/2018, 12:26 PM

## 2018-05-20 NOTE — Progress Notes (Signed)
Pt's tele monitor  DC, vitals stable, walking inside the room, denies CP and SOB, in RA, SPO2 maintained @96 , will continue to monitor  Lonia Farberekha, RN

## 2018-05-21 DIAGNOSIS — N179 Acute kidney failure, unspecified: Secondary | ICD-10-CM

## 2018-05-21 DIAGNOSIS — N183 Chronic kidney disease, stage 3 (moderate): Secondary | ICD-10-CM

## 2018-05-21 LAB — BASIC METABOLIC PANEL
ANION GAP: 15 (ref 5–15)
BUN: 69 mg/dL — ABNORMAL HIGH (ref 8–23)
CALCIUM: 8 mg/dL — AB (ref 8.9–10.3)
CO2: 19 mmol/L — AB (ref 22–32)
CREATININE: 2.51 mg/dL — AB (ref 0.44–1.00)
Chloride: 100 mmol/L (ref 98–111)
GFR, EST AFRICAN AMERICAN: 21 mL/min — AB (ref >60)
GFR, EST NON AFRICAN AMERICAN: 18 mL/min — AB (ref >60)
Glucose, Bld: 158 mg/dL — ABNORMAL HIGH (ref 70–99)
Potassium: 5.1 mmol/L (ref 3.5–5.1)
SODIUM: 134 mmol/L — AB (ref 135–145)

## 2018-05-21 LAB — GLUCOSE, CAPILLARY
GLUCOSE-CAPILLARY: 148 mg/dL — AB (ref 70–99)
Glucose-Capillary: 157 mg/dL — ABNORMAL HIGH (ref 70–99)

## 2018-05-21 MED ORDER — FUROSEMIDE 40 MG PO TABS: 20.0000 mg | ORAL_TABLET | Freq: Every day | ORAL | Status: AC

## 2018-05-21 MED ORDER — AMOXICILLIN-POT CLAVULANATE 500-125 MG PO TABS: 1.0000 | ORAL_TABLET | Freq: Two times a day (BID) | ORAL | 0 refills | Status: AC

## 2018-05-21 MED ORDER — BUDESONIDE-FORMOTEROL FUMARATE 80-4.5 MCG/ACT IN AERO: 2.0000 | INHALATION_SPRAY | Freq: Two times a day (BID) | RESPIRATORY_TRACT | 3 refills | Status: AC

## 2018-05-21 MED ORDER — DM-GUAIFENESIN ER 30-600 MG PO TB12: 1.0000 | ORAL_TABLET | Freq: Two times a day (BID) | ORAL | 0 refills | Status: AC

## 2018-05-21 MED ORDER — SACUBITRIL-VALSARTAN 97-103 MG PO TABS: 1.0000 | ORAL_TABLET | Freq: Two times a day (BID) | ORAL | Status: AC

## 2018-05-21 NOTE — Progress Notes (Addendum)
Pt got discharged to home, discharge instructions provided and patient showed understanding to it, IV taken out,Telemonitor DC,pt left unit in wheelchair with all of the belongings accompanied with a family member (Husband). Home health arranged for her prior living by case manager  Lonia Farberekha, RN

## 2018-05-21 NOTE — Discharge Summary (Signed)
Physician Discharge Summary  Crystal Duran NFA:[id number] DOB: 08/09/1946 DOA: 05/17/2018  PCP: Fleet ContrasAvbuere, Edwin, MD  Admit date: 05/17/2018 Discharge date: 05/21/2018  Time spent: 35 minutes  Recommendations for Outpatient Follow-up:  1. Repeat basic metabolic panel to follow electrolytes and renal function. 2. Reassess patient's volume status and adjust diuretics regimen. 3. if renal function back to normal resume entresto.   Discharge Diagnoses:  Principal Problem:   Multifocal pneumonia Active Problems:   HYPERTENSION, BENIGN   Chronic systolic congestive heart failure (HCC)   Hypothyroidism   Diabetes mellitus type 2 in nonobese (HCC)   Acute renal failure superimposed on stage 3 chronic kidney disease (HCC)   Discharge Condition: stable and improved. Discharge home with instructions to follow up with PCP in 2 weeks and with cardiology service in 5 days.   Diet recommendation: heart healthy diet.  Filed Weights   05/19/18 0541 05/20/18 0527 05/21/18 0500  Weight: 62.2 kg 63.6 kg 64.8 kg    History of present illness:  As per H&P written by Dr. Ophelia CharterYates on 05/17/2018. 72 y.o. female with medical history significant of DM; CAD; prior cardiac arrest resulting from vfib with AICD placement; severe systolic CHF (EF 46-96%10-15% in 11/18) from ischemic cardiomyopathy; hypothyroidism; HTN; HLD; and stage 3 CKD presenting with CP/SOB. For the last few days, she has had SOB, abdominal pain, difficulty swallowing water, back pain.  She had some n/v the other day.  She is unable to lie flat because she can't breathe.  She is having difficulty sleeping.  +cough, productive of clear/white sputum.  ?minimal hemoptysis.  She has had weakness in her legs.  No fevers.  She was hospitalized 1 month ago but has not really gotten back to her old self with her energy.  She continued to have leg weakness, SOB other than a few days after discharge.   She is not a smoker.  ED Course:  PNA, multifocal.   Admitted to cards 1 month ago with PNA and cardiac issues.  Dr. Sharyn LullHarwani consulted and suggests admission to medicine.  Dr. Sharyn LullHarwani is aware of 12-beat run of vtach, patient has AICD in place.  Treated with Rocephin monotherapy, but will need broadened coverage for HCAP given inpatient stay last month.  Hospital Course:  1-Multifocal pneumonia/HCAP -patient received IV broad spectrum antibiotics on admission  -complete PO antibiotics therapy. -patient completed 5 days of prednisone. -continue nebulizer/inhaler treatment -follow final culture results; so far no growth. -discharge on symbicort. -continue oxygen supplementation  2-chronic resp failure -patient uses 2-3L [place] at home -stable overall; even still experiencing SOB on exertion, most likely from #1 -will continue oxygen supplementation  -continue using flutter valve  3-HYPERTENSION, BENIGN -stable -continue current antihypertensive regimen  -heart healthy diet encouraged.  4-acute kidney injury on chronic renal failure: patient with stage 3 CKD at baseline  -will continue holding diuretics and entresto -continue minimizing the use of nephrotoxic agents -advise to maintain adequate PO intake/hydration -follow renal function trend -Cr started to trended down; at discharge 2.5 -patient reported no dysuria or urinary retention symptoms. -close follow up with PCP and cardiologist recommended -low dose lasix restarted; entresto kept on hold.  5-Chronic systolic congestive heart failure (HCC) -overall compensated currently -continue holding entresto, also follow-up with cardiology as an outpatient.. -follow daily weights and strict intake and output -continue b-blocker and resume low dose lasix. -last EF around 15%; patient is s/p AICD.  6-Hypothyroidism -continue synthroid   7-Diabetes mellitus type 2 in nonobese [facility](HCC): with underlying  nephropathy -treated with sliding scale insulin while inpatient. -patient  following diet management as an outpatient.   8-HLD -continue statins  9-hyperkalemia -Most likely associated with hemolysis. -Repeat basic metabolic panel demonstrating normal potassium level. -We will recommend repeat basic metabolic panel as an outpatient to follow electrolytes trend.  Procedures:  See below for x-ray reports.  Consultations:  Cardiology was consulted by EDP.  Discharge Exam: Vitals:   05/21/18 0451 05/21/18 0912  BP: 104/65 100/60  Pulse: 71 70  Resp: 18   Temp: (!) 97.4 F (36.3 C)   SpO2: 100% 97%   General exam: Alert, awake, oriented x 3; patient reports feeling much and breathing a lot easier.  No chest pain, no palpitations, no nausea, no vomiting.  Reports appetite and oral intake has improved.  She is afebrile and in no acute distress. Respiratory system: Improved air movement bilaterally, no crackles on exam; currently without any wheezing.  Positive rhonchi appreciated.  Good oxygen saturation while wearing  2 L nasal cannula supplementation. Cardiovascular system: Rate controlled. No rubs, gallops or JVD. Gastrointestinal system: Abdomen is nondistended, soft and nontender. No organomegaly or masses felt. Normal bowel sounds heard. Central nervous system: Alert and oriented. No focal neurological deficits. Extremities: No cyanosis, no clubbing; no edema. Skin: No rashes, lesions or ulcers Psychiatry: Judgement and insight appear normal. Mood & affect appropriate.    Discharge Instructions   Discharge Instructions    (HEART FAILURE PATIENTS) Call MD:  Anytime you have any of the following symptoms: 1) 3 pound weight gain in 24 hours or 5 pounds in 1 week 2) shortness of breath, with or without a dry hacking cough 3) swelling in the hands, feet or stomach 4) if you have to sleep on extra pillows at night in order to breathe.   Complete by:  As directed    Diet - low sodium heart healthy   Complete by:  As directed    Discharge instructions    Complete by:  As directed    Take medications as prescribed Please hold Entresto until follow-up with cardiology service. Arrange follow-up with Dr. Sharyn Lull in 5 days. Follow low-sodium diet and check your weight on daily basis. Maintain adequate hydration.     Allergies as of 05/21/2018      Reactions   Eggs Or Egg-derived Products Nausea And Vomiting      Medication List    TAKE these medications   albuterol 108 (90 Base) MCG/ACT inhaler Commonly known as:  PROVENTIL HFA;VENTOLIN HFA Inhale 1-2 puffs into the lungs every 4 (four) hours as needed for wheezing or shortness of breath.   amoxicillin-clavulanate 500-125 MG tablet Commonly known as:  AUGMENTIN Take 1 tablet (500 mg total) by mouth 2 (two) times daily for 3 days.   aspirin EC 81 MG tablet Take 81 mg by mouth daily.   budesonide-formoterol 80-4.5 MCG/ACT inhaler Commonly known as:  SYMBICORT Inhale 2 puffs into the lungs 2 (two) times daily.   carvedilol 25 MG tablet Commonly known as:  COREG Take 0.5 tablets (12.5 mg total) by mouth 2 (two) times daily.   dextromethorphan-guaiFENesin 30-600 MG 12hr tablet Commonly known as:  MUCINEX DM Take 1 tablet by mouth 2 (two) times daily.   furosemide 40 MG tablet Commonly known as:  LASIX Take 0.5 tablets (20 mg total) by mouth daily. What changed:    how much to take  when to take this   levothyroxine 175 MCG tablet Commonly known as:  SYNTHROID, LEVOTHROID  Take 175 mcg by mouth daily before breakfast.   nitroGLYCERIN 0.4 MG SL tablet Commonly known as:  NITROSTAT Place 1 tablet (0.4 mg total) under the tongue every 5 (five) minutes x 3 doses as needed for chest pain.   OXYGEN Inhale 2 L into the lungs continuous.   rosuvastatin 20 MG tablet Commonly known as:  CRESTOR Take 0.5 tablets (10 mg total) by mouth daily.   sacubitril-valsartan 97-103 MG Commonly known as:  ENTRESTO Take 1 tablet by mouth 2 (two) times daily. HOLD UNTIL FOLLOW UP WITH  CARDIOLOGY SERVICE. What changed:  additional instructions            Durable Medical Equipment  (From admission, onward)         Start     Ordered   05/19/18 1536  For home use only DME 3 n 1  Once     05/19/18 1535         Allergies  Allergen Reactions  . Eggs Or Egg-Derived Products Nausea And Vomiting   Follow-up Information    Fleet Contras, MD. Schedule an appointment as soon as possible for a visit in 2 week(s).   Specialty:  Internal Medicine Contact information: [address] [facility] [address] [place] [address] [phone]        Rinaldo Cloud, MD. Schedule an appointment as soon as possible for a visit in 5 day(s).   Specialty:  Cardiology Contact information: [address] [place] [address] [phone]           The results of significant diagnostics from this hospitalization (including imaging, microbiology, ancillary and laboratory) are listed below for reference.    Significant Diagnostic Studies: Dg Chest 2 View  Result Date: 05/17/2018 CLINICAL DATA:  72 year old female with a history shortness of breath EXAM: CHEST - 2 VIEW COMPARISON:  04/11/2018 FINDINGS: Cardiomediastinal silhouette unchanged with cardiomegaly. Airspace opacity within the right lower lobe, with additional airspace opacity in the bilateral upper and left lower lungs. Blunting at the right costophrenic angle persists. No pneumothorax. Overall the degree of airspace opacity has worsened from the comparison. Unchanged cardiac AICD. IMPRESSION: Evidence of multifocal pneumonia, predominantly right-sided. Electronically Signed   By: Gilmer Mor D.O.   On: 05/17/2018 12:56   Ct Chest Wo Contrast  Result Date: 05/17/2018 CLINICAL DATA:  Chest pain and dyspnea worsening over the past 3 days. History of CHF and hypertension. EXAM: CT CHEST WITHOUT CONTRAST TECHNIQUE: Multidetector CT imaging of the chest was performed following the standard protocol without IV  contrast. COMPARISON:  CXR 05/17/2018 FINDINGS: Cardiovascular: Cardiomegaly with left main and three-vessel coronary arteriosclerosis. Left-sided ICD device with lead in the right ventricle is noted. No pericardial effusion. Mild atherosclerosis of the thoracic aorta without aneurysm. The unenhanced pulmonary vasculature is unremarkable. Conventional branch pattern of the great vessels with atherosclerotic origins. Mediastinum/Nodes: Midline trachea. Esophagus is unremarkable. Enlarged mediastinal lymph nodes along the lower paratracheal portions bilaterally measuring up to 19 mm short axis, nonspecific in etiology. Assessment for hilar adenopathy is limited due to lack of IV contrast. Lungs/Pleura: Bilateral right greater than left pleural effusions, small to moderate on the right and small on the left with adjacent atelectasis, peribronchial thickening and faint ground-glass opacities in the right upper and lower lobes possibly representing stigmata of CHF. Alveolitis/pneumonitis or hypoventilatory change might also account for contribute to this appearance. Upper Abdomen: Nonacute Musculoskeletal: No chest wall mass or suspicious bone lesions identified. IMPRESSION: 1. Cardiomegaly with left main and three-vessel coronary  arteriosclerosis and aortic atherosclerosis. 2. Right greater than left pleural effusions with probable stigmata of CHF accounting for ground-glass opacities noted in the right upper and lower lobes. 3. Mild diffuse peribronchial thickening likely reflecting bronchitic change. 4. Mild mediastinal lymphadenopathy question reactive. Aortic Atherosclerosis (ICD10-I70.0). Electronically Signed   By: Tollie Eth M.D.   On: 05/17/2018 18:58    Microbiology: Recent Results (from the past 240 hour(s))  Culture, sputum-assessment     Status: None   Collection Time: 05/18/18  7:01 AM  Result Value Ref Range Status   Specimen Description SPUTUM  Final   Special Requests NONE  Final   Sputum  evaluation   Final    Sputum specimen not acceptable for testing.  Please recollect.   RESULT CALLED TO, READ BACK BY AND VERIFIED WITH: CLARLES RN AT 1216 ON [id number] BY SJW Performed at [facility], [address], [place], [place] [address]    Report Status 05/18/2018 FINAL  Final     Labs: Basic Metabolic Panel: Recent Labs  Lab 05/17/18 1212 05/18/18 0517 05/19/18 0645 05/20/18 0601 05/21/18 0514  NA 142 140 135 134* 134*  K 4.0 4.5 5.6* 4.8 5.1  CL 104 103 99 98 100  CO2 21* 19* 21* 20* 19*  GLUCOSE 125* 144* 145* 168* 158*  BUN 32* 42* 58* 63* 69*  CREATININE 2.21* 2.38* 2.87* 2.54* 2.51*  CALCIUM 9.0 8.6* 8.0* 8.1* 8.0*   CBC: Recent Labs  Lab 05/17/18 1212 05/18/18 0517  WBC 5.2 4.5  NEUTROABS  --  3.7  HGB 11.9* 11.9*  HCT 38.1 37.4  MCV 92.3 91.7  PLT 164 165    BNP (last 3 results) Recent Labs    07/11/17 1838 04/07/18 1833  BNP >4,500.0* >4,500.0*    CBG: Recent Labs  Lab 05/20/18 0846 05/20/18 1250 05/20/18 1630 05/20/18 2048 05/21/18 0802  GLUCAP 167* 157* 157* 190* 148*    Signed:  Vassie Loll MD.  [facility] 05/21/2018, 10:15 AM

## 2018-05-21 NOTE — Plan of Care (Signed)
  Problem: Activity: Goal: Risk for activity intolerance will decrease Outcome: Progressing   Problem: Nutrition: Goal: Adequate nutrition will be maintained Outcome: Progressing   Problem: Coping: Goal: Level of anxiety will decrease Outcome: Progressing   

## 2018-05-21 NOTE — Progress Notes (Signed)
Requested 3/1 to be delivered to room, verified with patient that she has transportation home, notified [facility] that she will DC today. No other CM needs

## 2018-05-24 LAB — CUP PACEART REMOTE DEVICE CHECK
Battery Remaining Longevity: 35 mo
Battery Remaining Percentage: 30 %
Brady Statistic RV Percent Paced: 1 %
Date Time Interrogation Session: 20190827060010
HIGH POWER IMPEDANCE MEASURED VALUE: 46 Ohm
HighPow Impedance: 46 Ohm
Implantable Lead Implant Date: 20110622
Implantable Lead Model: 7122
Implantable Pulse Generator Implant Date: 20110622
Lead Channel Impedance Value: 310 Ohm
Lead Channel Pacing Threshold Amplitude: 0.75 V
Lead Channel Pacing Threshold Pulse Width: 0.4 ms
Lead Channel Setting Pacing Pulse Width: 0.4 ms
MDC IDC LEAD LOCATION: 753860
MDC IDC MSMT BATTERY VOLTAGE: 2.86 V
MDC IDC MSMT LEADCHNL RV SENSING INTR AMPL: 10.7 mV
MDC IDC SET LEADCHNL RV PACING AMPLITUDE: 2 V
MDC IDC SET LEADCHNL RV SENSING SENSITIVITY: 0.5 mV
Pulse Gen Serial Number: 608161

## 2018-06-06 ENCOUNTER — Telehealth: Payer: Self-pay

## 2018-06-06 ENCOUNTER — Telehealth: Payer: Self-pay | Admitting: Cardiology

## 2018-06-06 NOTE — Telephone Encounter (Signed)
Patient physical therapist with [facility] called and stated that patient believes she received therapies from her ICD. She stated that pt told her she passed out and then regained consciousness. Instructed physical therapist to have pt send a remote transmission w/ her home monitor. Once the transmission is received a Armed forces logistics/support/administrative officer will review and call pt back with the results. Pt physical therapist verbalized understanding. You can call pt at [phone].

## 2018-06-06 NOTE — Telephone Encounter (Signed)
Attempted to call pt x  2 no answer. Spoke with Princess from [facility] who stated that she had left pts house 10 minutes ago and that pt's husband had left with a friend. Princess from [facility] to go back by pts house to inform pt of an apt to be scheduled to see Dr. Graciela Husbands on 04/09/18 at 11:15am for f/u for shock.

## 2018-06-06 NOTE — Telephone Encounter (Signed)
Spoke with pt on speaker phone with princess from [facility] present pt denied any dizziness or SOB before or after shock, pt reported compliance with Core 12.5mg  BID, pt agreeable to apt on 06/09/18 at 11:15 am with Dr. Graciela Husbands.

## 2018-06-09 ENCOUNTER — Encounter (INDEPENDENT_AMBULATORY_CARE_PROVIDER_SITE_OTHER): Payer: Self-pay

## 2018-06-09 ENCOUNTER — Ambulatory Visit: Payer: Medicare HMO | Admitting: Internal Medicine

## 2018-06-09 ENCOUNTER — Encounter: Payer: Self-pay | Admitting: Internal Medicine

## 2018-06-09 VITALS — BP 121/76 | HR 86 | Ht 64.5 in | Wt 134.4 lb

## 2018-06-09 DIAGNOSIS — Z95810 Presence of automatic (implantable) cardiac defibrillator: Secondary | ICD-10-CM

## 2018-06-09 DIAGNOSIS — I255 Ischemic cardiomyopathy: Secondary | ICD-10-CM

## 2018-06-09 DIAGNOSIS — I5022 Chronic systolic (congestive) heart failure: Secondary | ICD-10-CM

## 2018-06-09 MED ORDER — AMIODARONE HCL 200 MG PO TABS: ORAL_TABLET | ORAL | 3 refills | Status: AC

## 2018-06-09 MED ORDER — AMIODARONE HCL 200 MG PO TABS: ORAL_TABLET | ORAL | 3 refills | Status: DC

## 2018-06-09 NOTE — Progress Notes (Signed)
Patient Care Team: Fleet Contras, MD as PCP - General (Internal Medicine)   HPI  Crystal Duran is a 72 y.o. female seen in follow-up for ICD implantation 2011 for aborted cardiac arrest.  She has had recurrent ICD discharges, 3/19, 5/19.10/19 She has ischemic cardiomyopathy   She was recently admitted to the hospital 11/18 for congestive heart failure.    She was seen thereafter with consideration for CRT upgrade, she had a variable IVCD/left bundle as well as severe MR.  It was elected not to pursue it given these findings.  She was seen by Dr. Sharyn Lull at the hospital 8/19 when she presented with pneumonia.  Reviewing his discharge summary he again raises the issue of CRT upgrade.  I do not see an interval echocardiogram.   DATE TEST EF   1/11 Myoview   38 %   10/18 Myoview   20 % No ischemia  11/18 Echo  10-15%      Date Cr K  11/18 2.04 5.6  11/18  1.27 4.4  9/19 2.51 5.1   She is moderately dyspneic with exertion has significant fatigue does not have peripheral edema no chest pain  Records and Results Reviewed   Past Medical History:  Diagnosis Date  . AICD (automatic cardioverter/defibrillator) present    [employer] Fortify ICD [phone]  . Arthritis    "hands, back" (04/07/2018)  . CAP (community acquired pneumonia) 04/07/2018  . 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 CHF (congestive heart failure) (HCC)    systolic  . Chronic lower back pain   . CKD (chronic kidney disease), stage III (HCC)   . History of tobacco abuse   . HTN (hypertension)   . Hypercholesteremia   . Hypothyroidism   . Ischemic cardiomyopathy    severe  . MI (myocardial infarction) (HCC) 02/2009   Hx of inferior wall MI   . On home oxygen therapy    "2-4L prn" (04/07/2018)  . Pneumonia    "once before today" (04/07/2018)  . Type 2 diabetes mellitus (HCC)     Past Surgical History:  Procedure Laterality Date  . ABDOMINAL HYSTERECTOMY    . CARDIAC  DEFIBRILLATOR PLACEMENT  2011   single chamber. [employer] Fortify ICD 578-46N. Remote- no.   . CORONARY ANGIOPLASTY WITH STENT PLACEMENT  02/2009   [name] 02/07/2010    Current Meds  Medication Sig  . albuterol (PROVENTIL HFA;VENTOLIN HFA) 108 (90 Base) MCG/ACT inhaler Inhale 1-2 puffs into the lungs every 4 (four) hours as needed for wheezing or shortness of breath.  [name] aspirin EC 81 MG tablet Take 81 mg by mouth daily.  . budesonide-formoterol (SYMBICORT) 80-4.5 MCG/ACT inhaler Inhale 2 puffs into the lungs 2 (two) times daily.  . carvedilol (COREG) 25 MG tablet Take 0.5 tablets (12.5 mg total) by mouth 2 (two) times daily.  [name] dextromethorphan-guaiFENesin (MUCINEX DM) 30-600 MG 12hr tablet Take 1 tablet by mouth 2 (two) times daily.  . furosemide (LASIX) 40 MG tablet Take 0.5 tablets (20 mg total) by mouth daily.  [name] levothyroxine (SYNTHROID, LEVOTHROID) 175 MCG tablet Take 175 mcg by mouth daily before breakfast.  . nitroGLYCERIN (NITROSTAT) 0.4 MG SL tablet Place 1 tablet (0.4 mg total) under the tongue every 5 (five) minutes x 3 doses as needed for chest pain.  . OXYGEN Inhale 2 L into the lungs continuous.  . rosuvastatin (CRESTOR) 20 MG tablet Take 0.5 tablets (10 mg total) by mouth daily.  [name]  sacubitril-valsartan (ENTRESTO) 97-103 MG Take 1 tablet by mouth 2 (two) times daily. HOLD UNTIL FOLLOW UP WITH CARDIOLOGY SERVICE.    Allergies  Allergen Reactions  . Eggs Or Egg-Derived Products Nausea And Vomiting      Review of Systems negative except from HPI and PMH  Physical Exam BP 121/76   Pulse 86   Ht 5' 4.5" (1.638 m)   Wt 134 lb 6.4 oz (61 kg)   SpO2 99%   BMI 22.71 kg/m  Well developed and nourished in no acute distress HENT normal Neck supple with JVP-8 emesis Clear Regular rate and rhythm, no murmurs or gallops Abd-soft with active BS No Clubbing cyanosis edema Skin-warm and dry A & Oriented  Grossly normal sensory and motor function   ECG 86 21/17/45 IVCD    Assessment and  Plan  Nonischemic and ischemic cardiomyopathy  Congestive heart failure class III  Left bundle branch block/left LVH with QRS widening  Mitral regurgitation-severe  Recurrent syncope   Renal insufficiency grade 4  VT polymorphic   She is having significant increased burden of polymorphic ventricular tachycardia.  I worry that this is a manifestation of progressive electromechanical interactions because of worsening left ventricular function in the context of known severe cardiomyopathy and severe mitral regurgitation.  I am not sure that she is a candidate for advanced therapies given her renal insufficiency which is progressive or transplantation because of her age.  I spoke with Dr. Sharyn Lull.  He will consider referral to the heart failure clinic.  I have suggested also that he consider reinitiation of BiDil  We will begin amiodarone for ventricular tachycardia for what ever anti-ischemic triggers might be given the fact that it is not pause dependent polymorphic VT.  We will begin her on 400 twice daily x2 weeks, 200 twice daily and then 200 mg a day.  I have also told her that I am concerned that she may be reaching in end-stage aspect of her cardiomyopathy with the ventricular arrhythmias being manifestation of that.  She told me she has been concerned about these things and started working on her living will just recently.  More than 50% of 45 min was spent in counseling related to the above    Current medicines are reviewed at length with the patient today .  The patient does not  have concerns regarding medicines.

## 2018-06-09 NOTE — Patient Instructions (Addendum)
Medication Instructions:   Begin Amiodarone. You will do a loading dose of Amiodarone and then follow a "taper" down of this medication.  October 11- October 25: Take 400mg , two tablets, two times per day October 26- November 7: Take 200mg , one tablet, two times per day November 8: Begin taking 200mg , one tablet, once per day   If you need a refill on your cardiac medications before your next appointment, please call your pharmacy.   Lab work: You will have labs drawn today: BMP  If you have labs (blood work) drawn today and your tests are completely normal, you will receive your results only by: [name] MyChart Message (if you have MyChart) OR . A paper copy in the mail If you have any lab test that is abnormal or we need to change your treatment, we will call you to review the results.  Testing/Procedures: None ordered.  Follow-Up:  Please schedule a follow up appointment with Dr Graciela Husbands in 6-8 weeks.  Any Other Special Instructions Will Be Listed Below (If Applicable).

## 2018-06-10 LAB — BASIC METABOLIC PANEL
BUN/Creatinine Ratio: 14 (ref 12–28)
BUN: 19 mg/dL (ref 8–27)
CO2: 21 mmol/L (ref 20–29)
Calcium: 8.9 mg/dL (ref 8.7–10.3)
Chloride: 102 mmol/L (ref 96–106)
Creatinine, Ser: 1.4 mg/dL — ABNORMAL HIGH (ref 0.57–1.00)
GFR, EST AFRICAN AMERICAN: 43 mL/min/{1.73_m2} — AB (ref >59)
GFR, EST NON AFRICAN AMERICAN: 37 mL/min/{1.73_m2} — AB (ref >59)
Glucose: 102 mg/dL — ABNORMAL HIGH (ref 65–99)
POTASSIUM: 3.5 mmol/L (ref 3.5–5.2)
SODIUM: 143 mmol/L (ref 134–144)

## 2018-06-21 ENCOUNTER — Emergency Department (HOSPITAL_COMMUNITY)
Admission: EM | Admit: 2018-06-21 | Discharge: 2018-06-21 | Disposition: A | Discharge status: Home / Self Care | Payer: Medicare HMO | Source: Home / Self Care | Attending: Emergency Medicine | Admitting: Emergency Medicine

## 2018-06-21 ENCOUNTER — Telehealth: Payer: Self-pay

## 2018-06-21 ENCOUNTER — Other Ambulatory Visit: Payer: Self-pay

## 2018-06-21 ENCOUNTER — Encounter (HOSPITAL_COMMUNITY): Payer: Self-pay

## 2018-06-21 DIAGNOSIS — Z87891 Personal history of nicotine dependence: Secondary | ICD-10-CM | POA: Insufficient documentation

## 2018-06-21 DIAGNOSIS — T82198A Other mechanical complication of other cardiac electronic device, initial encounter: Secondary | ICD-10-CM

## 2018-06-21 DIAGNOSIS — I4901 Ventricular fibrillation: Secondary | ICD-10-CM | POA: Diagnosis not present

## 2018-06-21 DIAGNOSIS — E876 Hypokalemia: Secondary | ICD-10-CM

## 2018-06-21 DIAGNOSIS — E039 Hypothyroidism, unspecified: Secondary | ICD-10-CM

## 2018-06-21 DIAGNOSIS — I5022 Chronic systolic (congestive) heart failure: Secondary | ICD-10-CM

## 2018-06-21 DIAGNOSIS — Z7982 Long term (current) use of aspirin: Secondary | ICD-10-CM

## 2018-06-21 DIAGNOSIS — E1122 Type 2 diabetes mellitus with diabetic chronic kidney disease: Secondary | ICD-10-CM

## 2018-06-21 DIAGNOSIS — Z79899 Other long term (current) drug therapy: Secondary | ICD-10-CM | POA: Insufficient documentation

## 2018-06-21 DIAGNOSIS — Z7984 Long term (current) use of oral hypoglycemic drugs: Secondary | ICD-10-CM | POA: Insufficient documentation

## 2018-06-21 DIAGNOSIS — N183 Chronic kidney disease, stage 3 (moderate): Secondary | ICD-10-CM

## 2018-06-21 DIAGNOSIS — Z955 Presence of coronary angioplasty implant and graft: Secondary | ICD-10-CM | POA: Insufficient documentation

## 2018-06-21 DIAGNOSIS — I130 Hypertensive heart and chronic kidney disease with heart failure and stage 1 through stage 4 chronic kidney disease, or unspecified chronic kidney disease: Secondary | ICD-10-CM

## 2018-06-21 DIAGNOSIS — Y828 Other medical devices associated with adverse incidents: Secondary | ICD-10-CM | POA: Insufficient documentation

## 2018-06-21 DIAGNOSIS — I252 Old myocardial infarction: Secondary | ICD-10-CM | POA: Insufficient documentation

## 2018-06-21 DIAGNOSIS — Z4502 Encounter for adjustment and management of automatic implantable cardiac defibrillator: Secondary | ICD-10-CM

## 2018-06-21 DIAGNOSIS — I469 Cardiac arrest, cause unspecified: Secondary | ICD-10-CM | POA: Diagnosis not present

## 2018-06-21 LAB — CBC WITH DIFFERENTIAL/PLATELET
ABS IMMATURE GRANULOCYTES: 0.01 10*3/uL (ref 0.00–0.07)
Basophils Absolute: 0 10*3/uL (ref 0.0–0.1)
Basophils Relative: 1 %
EOS PCT: 1 %
Eosinophils Absolute: 0 10*3/uL (ref 0.0–0.5)
HEMATOCRIT: 39.6 % (ref 36.0–46.0)
Hemoglobin: 12.4 g/dL (ref 12.0–15.0)
Immature Granulocytes: 0 %
LYMPHS ABS: 1.1 10*3/uL (ref 0.7–4.0)
LYMPHS PCT: 29 %
MCH: 29.2 pg (ref 26.0–34.0)
MCHC: 31.3 g/dL (ref 30.0–36.0)
MCV: 93.2 fL (ref 80.0–100.0)
MONO ABS: 0.4 10*3/uL (ref 0.1–1.0)
MONOS PCT: 11 %
NEUTROS ABS: 2.3 10*3/uL (ref 1.7–7.7)
Neutrophils Relative %: 58 %
Platelets: 183 10*3/uL (ref 150–400)
RBC: 4.25 MIL/uL (ref 3.87–5.11)
RDW: 19.4 % — ABNORMAL HIGH (ref 11.5–15.5)
WBC: 3.9 10*3/uL — ABNORMAL LOW (ref 4.0–10.5)
nRBC: 0 % (ref 0.0–0.2)

## 2018-06-21 LAB — BASIC METABOLIC PANEL
Anion gap: 15 (ref 5–15)
BUN: 40 mg/dL — AB (ref 8–23)
CHLORIDE: 102 mmol/L (ref 98–111)
CO2: 22 mmol/L (ref 22–32)
CREATININE: 3.26 mg/dL — AB (ref 0.44–1.00)
Calcium: 8.9 mg/dL (ref 8.9–10.3)
GFR calc Af Amer: 15 mL/min — ABNORMAL LOW (ref >60)
GFR calc non Af Amer: 13 mL/min — ABNORMAL LOW (ref >60)
GLUCOSE: 138 mg/dL — AB (ref 70–99)
POTASSIUM: 3.4 mmol/L — AB (ref 3.5–5.1)
Sodium: 139 mmol/L (ref 135–145)

## 2018-06-21 LAB — I-STAT TROPONIN, ED: Troponin i, poc: 0.03 ng/mL (ref 0.00–0.08)

## 2018-06-21 LAB — POTASSIUM: Potassium: 3.4 mmol/L — ABNORMAL LOW (ref 3.5–5.1)

## 2018-06-21 LAB — MAGNESIUM: MAGNESIUM: 2.6 mg/dL — AB (ref 1.7–2.4)

## 2018-06-21 MED ORDER — POTASSIUM CHLORIDE CRYS ER 20 MEQ PO TBCR
40.0000 meq | EXTENDED_RELEASE_TABLET | Freq: Once | ORAL | Status: AC
  Administered 2018-06-21: 40 meq via ORAL
  Filled 2018-06-21: qty 2

## 2018-06-21 NOTE — Discharge Instructions (Addendum)
Your pacemaker interrogation showed that you had an episode of ventricular fibrillation yesterday.  Your potassium is low today and you have been given potassium.  It is very important to call Dr. Graciela Husbands tomorrow for further adjustment of medicines and reevaluation for your heart.  If you were to develop any worsening symptoms, recurrent shocking episodes, or anything else that is concerning then return to the ER for evaluation.

## 2018-06-21 NOTE — ED Provider Notes (Signed)
[facility] EMERGENCY DEPARTMENT Provider Note   CSN: [id number] Arrival date & time: 06/21/18  1558     History   Chief Complaint Chief Complaint  Patient presents with  . AICD Problem    HPI Crystal Duran is a 72 y.o. female.  HPI  72 year old female with a history of an AICD for systolic CHF presents after being shocked by her defibrillator.  This occurred yesterday around 10 AM.  She was sleeping when this happened but it did not wake her up.  It did wake her husband up.  She recently was started on amiodarone about a week ago.  She denies being ill recently and currently feels fine.  No chest pain, shortness of breath, vomiting, fever, diarrhea.  Cardiology told her to come to the ER to be checked out.  Chronic lower extremity edema is unchanged.  Past Medical History:  Diagnosis Date  . AICD (automatic cardioverter/defibrillator) present    St. Jude Fortify ICD [phone]  . Arthritis    "hands, back" (04/07/2018)  . CAP (community acquired pneumonia) 04/07/2018  . 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 CHF (congestive heart failure) (HCC)    systolic  . Chronic lower back pain   . CKD (chronic kidney disease), stage III (HCC)   . History of tobacco abuse   . HTN (hypertension)   . Hypercholesteremia   . Hypothyroidism   . Ischemic cardiomyopathy    severe  . MI (myocardial infarction) (HCC) 02/2009   Hx of inferior wall MI   . On home oxygen therapy    "2-4L prn" (04/07/2018)  . Pneumonia    "once before today" (04/07/2018)  . Type 2 diabetes mellitus [facility])     Patient Active Problem List   Diagnosis Date Noted  . Acute renal failure superimposed on stage 3 chronic kidney disease (HCC)   . Multifocal pneumonia 05/17/2018  . Malnutrition of moderate degree 04/09/2018  . Community acquired pneumonia 04/07/2018  . Hypokalemia 07/15/2017  . Acute kidney injury superimposed on chronic kidney disease (HCC) 07/13/2017  .  Hypothyroidism 07/12/2017  . Mild protein-calorie malnutrition (HCC) 07/12/2017  . Diabetes mellitus type 2 in nonobese (HCC) 07/12/2017  . CHF exacerbation (HCC) 07/11/2017  . Acute systolic congestive heart failure (HCC) 06/25/2016  . aborted cardiac arrest 03/11/2011  . Chronic systolic congestive heart failure (HCC) 03/18/2011  . HYPERTENSION, BENIGN 06/01/2010  . CAD 06/01/2010  . CARDIOMYOPATHY, ISCHEMIC 06/01/2010  . IMPLANTATION OF DEFIBRILLATOR,ST. JUDE FORTIFY ICD 123-40Q 06/01/2010    Past Surgical History:  Procedure Laterality Date  . ABDOMINAL HYSTERECTOMY    . CARDIAC DEFIBRILLATOR PLACEMENT  2011   single chamber. St Jude Fortify ICD 324-40N. Remote- no.   . CORONARY ANGIOPLASTY WITH STENT PLACEMENT  02/2009   [name] 02/07/2010     OB History   None      Home Medications    Prior to Admission medications   Medication Sig Start Date End Date Taking? Authorizing Provider  albuterol (PROVENTIL HFA;VENTOLIN HFA) 108 (90 Base) MCG/ACT inhaler Inhale 1-2 puffs into the lungs every 4 (four) hours as needed for wheezing or shortness of breath.   Yes [provider]  amiodarone (PACERONE) 200 MG tablet Oct 11-25, 400mg  (two tabs) 2x per day; Oct 26-Nov 7, 200mg  (One tab) 2x per day; Nov 8, 200mg  (One Tab) 1x per day 06/09/18  Yes Duke Salvia, MD  aspirin EC 81 MG tablet Take 81 mg by mouth  daily.   Yes [provider]  budesonide-formoterol (SYMBICORT) 80-4.5 MCG/ACT inhaler Inhale 2 puffs into the lungs 2 (two) times daily. 05/21/18  Yes Vassie Loll, MD  carvedilol (COREG) 25 MG tablet Take 0.5 tablets (12.5 mg total) by mouth 2 (two) times daily. 06/29/16  Yes Rinaldo Cloud, MD  furosemide (LASIX) 40 MG tablet Take 0.5 tablets (20 mg total) by mouth daily. Patient taking differently: Take 80 mg by mouth 2 (two) times daily.  05/21/18  Yes Vassie Loll, MD  levothyroxine (SYNTHROID, LEVOTHROID) 175 MCG tablet Take 175 mcg by mouth daily before  breakfast. 03/27/18  Yes [provider]  metFORMIN (GLUCOPHAGE) 500 MG tablet Take 500 mg by mouth daily.   Yes [provider]  nitroGLYCERIN (NITROSTAT) 0.4 MG SL tablet Place 1 tablet (0.4 mg total) under the tongue every 5 (five) minutes x 3 doses as needed for chest pain. 06/29/16  Yes Rinaldo Cloud, MD  OXYGEN Inhale 2 L into the lungs continuous.   Yes [provider]  rosuvastatin (CRESTOR) 20 MG tablet Take 0.5 tablets (10 mg total) by mouth daily. 04/11/18  Yes Rinaldo Cloud, MD  sacubitril-valsartan (ENTRESTO) 97-103 MG Take 1 tablet by mouth 2 (two) times daily. HOLD UNTIL FOLLOW UP WITH CARDIOLOGY SERVICE. 05/21/18  Yes Vassie Loll, MD  dextromethorphan-guaiFENesin [facility] DM) 30-600 MG 12hr tablet Take 1 tablet by mouth 2 (two) times daily. Patient not taking: Reported on 06/21/2018 05/21/18   Vassie Loll, MD    Family History Family History  Problem Relation Age of Onset  . Coronary artery disease Other        postive family Hx    Social History Social History   Tobacco Use  . Smoking status: Former Smoker    Packs/day: 0.50    Years: 5.00    Pack years: 2.50    Types: Cigarettes    Last attempt to quit: 1995    Years since quitting: 24.8  . Smokeless tobacco: Never Used  Substance Use Topics  . Alcohol use: Not Currently  . Drug use: Never     Allergies   Eggs or egg-derived products   Review of Systems Review of Systems  Constitutional: Negative for fever.  Respiratory: Negative for shortness of breath.   Cardiovascular: Negative for chest pain and palpitations.  Gastrointestinal: Negative for diarrhea and vomiting.  Neurological: Negative for dizziness.  All other systems reviewed and are negative.    Physical Exam Updated Vital Signs BP 114/65   Pulse 69   Temp 97.8 F (36.6 C) (Oral)   Resp 17   SpO2 97%   Physical Exam  Constitutional: She appears well-developed and well-nourished. No distress.  HENT:    Head: Normocephalic and atraumatic.  Right Ear: External ear normal.  Left Ear: External ear normal.  Nose: Nose normal.  Eyes: Right eye exhibits no discharge. Left eye exhibits no discharge.  Cardiovascular: Normal rate, regular rhythm and normal heart sounds.  Pulmonary/Chest: Effort normal and breath sounds normal.  AICD in place in left chest  Abdominal: Soft. There is no tenderness.  Musculoskeletal: She exhibits edema (BLE pitting edema, mild).  Neurological: She is alert.  Skin: Skin is warm and dry. She is not diaphoretic.  Psychiatric: Her mood appears not anxious.  Nursing note and vitals reviewed.    ED Treatments / Results  Labs (all labs ordered are listed, but only abnormal results are displayed) Labs Reviewed  BASIC METABOLIC PANEL - Abnormal; Notable for the following components:  Result Value   Potassium 3.4 (*)    Glucose, Bld 138 (*)    BUN 40 (*)    Creatinine, Ser 3.26 (*)    GFR calc non Af Amer 13 (*)    GFR calc Af Amer 15 (*)    All other components within normal limits  CBC WITH DIFFERENTIAL/PLATELET - Abnormal; Notable for the following components:   WBC 3.9 (*)    RDW 19.4 (*)    All other components within normal limits  POTASSIUM - Abnormal; Notable for the following components:   Potassium 3.4 (*)    All other components within normal limits  MAGNESIUM - Abnormal; Notable for the following components:   Magnesium 2.6 (*)    All other components within normal limits  I-STAT TROPONIN, ED    EKG EKG Interpretation  Date/Time:  Wednesday June 21 2018 16:01:51 EDT Ventricular Rate:  83 PR Interval:  216 QRS Duration: 174 QT Interval:  488 QTC Calculation: 573 R Axis:   -54 Text Interpretation:  Sinus rhythm with 1st degree A-V block Left axis deviation Left bundle branch block Abnormal ECG no significant change since Sept 2019 Confirmed by Pricilla Loveless [phone]) on 06/21/2018 6:53:56 PM   Radiology No results  found.  Procedures Procedures (including critical care time)  Medications Ordered in ED Medications  potassium chloride SA (K-DUR,KLOR-CON) CR tablet 40 mEq (40 mEq Oral Given 06/21/18 2223)     Initial Impression / Assessment and Plan / ED Course  I have reviewed the triage vital signs and the nursing notes.  Pertinent labs & imaging results that were available during my care of the patient were reviewed by me and considered in my medical decision making (see chart for details).     Patient's interrogation shows that she had a ventricular fibrillation episode yesterday.  This is now more than 24 hours old.  She has mildly low potassium at 3.4.  Her creatinine is a little worse than normal but her typical ranges about 2.5 to 2.85.  She does not appear to be in acute CHF on exam or history.  I discussed her presentation with Dr. Jearld Pies of cardiology who recommends repleting potassium with a dose of 40 mEq potassium now and having her call Dr. Graciela Husbands in the morning.  Otherwise given this occurred over 24 hours ago she should be stable for discharge home.  Patient is wanting to go home.  I think this is reasonable and we discussed strict return precautions.  Final Clinical Impressions(s) / ED Diagnoses   Final diagnoses:  AICD discharge  Hypokalemia    ED Discharge Orders    None       Pricilla Loveless, MD 06/21/18 2346

## 2018-06-21 NOTE — ED Notes (Signed)
ED Provider at bedside. 

## 2018-06-21 NOTE — ED Triage Notes (Signed)
Pt states she was sent over by cards to be checked out after her defibrillator fired yesterday. Pt has no complaints. No CP, no SOB.

## 2018-06-21 NOTE — ED Provider Notes (Signed)
Patient placed in Quick Look pathway, seen and evaluated   Chief Complaint: defibrillator fired yesterday  HPI: Crystal Duran is a 72 y.o. female who presents to the ED stating that she got a call from her cardiologist telling her to come to the ED to get checked because her defibrillator fired yesterday. Patient denies any problems.   ROS: patient denies any problems.   Physical Exam:  BP 115/70   Pulse 79   Temp 97.8 F (36.6 C) (Oral)   Resp 18   SpO2 100%    Gen: No distress  Neuro: Awake and Alert  Skin: Warm and dry   Initiation of care has begun. The patient has been counseled on the process, plan, and necessity for staying for the completion/evaluation, and the remainder of the medical screening examination    [name], NP 06/21/18 1653    Mesner, Barbara Cower, MD 06/21/18 2353

## 2018-06-21 NOTE — Telephone Encounter (Signed)
Spoke with Ms. Micheal regarding shock from 06/20/18 at 8:15am pt doesn't recall shock stated that she was asleep, pt stated that she had been taking the prescribed amount of amiodarone 400mg  twice a day as well as her coreg. Informed pt of driving restrictions x 6 months pt voiced understanding

## 2018-06-21 NOTE — Telephone Encounter (Signed)
Spoke with pt informed her that Dr. Graciela Husbands had recommended she go to the ED to be evaluated pt voiced understanding

## 2018-06-21 NOTE — ED Notes (Signed)
Pt's st jude ICD interrogated in ED

## 2018-06-21 NOTE — Telephone Encounter (Signed)
LVM for pt to call device clinic back regarding shock from 10/22 @ 8:14am

## 2018-06-23 ENCOUNTER — Inpatient Hospital Stay (HOSPITAL_COMMUNITY): Payer: Medicare HMO

## 2018-06-23 ENCOUNTER — Emergency Department (HOSPITAL_COMMUNITY): Payer: Medicare HMO

## 2018-06-23 ENCOUNTER — Other Ambulatory Visit: Payer: Self-pay

## 2018-06-23 ENCOUNTER — Inpatient Hospital Stay (HOSPITAL_COMMUNITY)
Admission: EM | Admit: 2018-06-23 | Discharge: 2018-06-30 | DRG: 308 | Disposition: E | Payer: Medicare HMO | Attending: Internal Medicine | Admitting: Internal Medicine

## 2018-06-23 ENCOUNTER — Other Ambulatory Visit: Payer: Self-pay | Admitting: Internal Medicine

## 2018-06-23 DIAGNOSIS — Z95810 Presence of automatic (implantable) cardiac defibrillator: Secondary | ICD-10-CM | POA: Diagnosis not present

## 2018-06-23 DIAGNOSIS — N179 Acute kidney failure, unspecified: Secondary | ICD-10-CM | POA: Diagnosis present

## 2018-06-23 DIAGNOSIS — I4901 Ventricular fibrillation: Principal | ICD-10-CM | POA: Diagnosis present

## 2018-06-23 DIAGNOSIS — Z978 Presence of other specified devices: Secondary | ICD-10-CM

## 2018-06-23 DIAGNOSIS — Z515 Encounter for palliative care: Secondary | ICD-10-CM | POA: Diagnosis not present

## 2018-06-23 DIAGNOSIS — I255 Ischemic cardiomyopathy: Secondary | ICD-10-CM | POA: Diagnosis present

## 2018-06-23 DIAGNOSIS — R402212 Coma scale, best verbal response, none, at arrival to emergency department: Secondary | ICD-10-CM | POA: Diagnosis present

## 2018-06-23 DIAGNOSIS — E1122 Type 2 diabetes mellitus with diabetic chronic kidney disease: Secondary | ICD-10-CM | POA: Diagnosis present

## 2018-06-23 DIAGNOSIS — R34 Anuria and oliguria: Secondary | ICD-10-CM | POA: Diagnosis present

## 2018-06-23 DIAGNOSIS — Y92009 Unspecified place in unspecified non-institutional (private) residence as the place of occurrence of the external cause: Secondary | ICD-10-CM | POA: Diagnosis not present

## 2018-06-23 DIAGNOSIS — G253 Myoclonus: Secondary | ICD-10-CM | POA: Diagnosis not present

## 2018-06-23 DIAGNOSIS — I252 Old myocardial infarction: Secondary | ICD-10-CM

## 2018-06-23 DIAGNOSIS — J9621 Acute and chronic respiratory failure with hypoxia: Secondary | ICD-10-CM

## 2018-06-23 DIAGNOSIS — Z7189 Other specified counseling: Secondary | ICD-10-CM

## 2018-06-23 DIAGNOSIS — I469 Cardiac arrest, cause unspecified: Secondary | ICD-10-CM | POA: Diagnosis present

## 2018-06-23 DIAGNOSIS — E876 Hypokalemia: Secondary | ICD-10-CM | POA: Diagnosis present

## 2018-06-23 DIAGNOSIS — E875 Hyperkalemia: Secondary | ICD-10-CM | POA: Diagnosis not present

## 2018-06-23 DIAGNOSIS — R402112 Coma scale, eyes open, never, at arrival to emergency department: Secondary | ICD-10-CM | POA: Diagnosis present

## 2018-06-23 DIAGNOSIS — N183 Chronic kidney disease, stage 3 (moderate): Secondary | ICD-10-CM | POA: Diagnosis present

## 2018-06-23 DIAGNOSIS — I130 Hypertensive heart and chronic kidney disease with heart failure and stage 1 through stage 4 chronic kidney disease, or unspecified chronic kidney disease: Secondary | ICD-10-CM | POA: Diagnosis present

## 2018-06-23 DIAGNOSIS — E039 Hypothyroidism, unspecified: Secondary | ICD-10-CM | POA: Diagnosis present

## 2018-06-23 DIAGNOSIS — Y848 Other medical procedures as the cause of abnormal reaction of the patient, or of later complication, without mention of misadventure at the time of the procedure: Secondary | ICD-10-CM | POA: Diagnosis present

## 2018-06-23 DIAGNOSIS — D631 Anemia in chronic kidney disease: Secondary | ICD-10-CM | POA: Diagnosis present

## 2018-06-23 DIAGNOSIS — J9601 Acute respiratory failure with hypoxia: Secondary | ICD-10-CM | POA: Diagnosis present

## 2018-06-23 DIAGNOSIS — Z9981 Dependence on supplemental oxygen: Secondary | ICD-10-CM

## 2018-06-23 DIAGNOSIS — E785 Hyperlipidemia, unspecified: Secondary | ICD-10-CM | POA: Diagnosis present

## 2018-06-23 DIAGNOSIS — Z87891 Personal history of nicotine dependence: Secondary | ICD-10-CM

## 2018-06-23 DIAGNOSIS — J95811 Postprocedural pneumothorax: Secondary | ICD-10-CM | POA: Diagnosis present

## 2018-06-23 DIAGNOSIS — G931 Anoxic brain damage, not elsewhere classified: Secondary | ICD-10-CM | POA: Diagnosis present

## 2018-06-23 DIAGNOSIS — I5022 Chronic systolic (congestive) heart failure: Secondary | ICD-10-CM | POA: Diagnosis present

## 2018-06-23 DIAGNOSIS — Z7984 Long term (current) use of oral hypoglycemic drugs: Secondary | ICD-10-CM

## 2018-06-23 DIAGNOSIS — E872 Acidosis: Secondary | ICD-10-CM | POA: Diagnosis present

## 2018-06-23 DIAGNOSIS — R570 Cardiogenic shock: Secondary | ICD-10-CM | POA: Diagnosis present

## 2018-06-23 DIAGNOSIS — J939 Pneumothorax, unspecified: Secondary | ICD-10-CM

## 2018-06-23 DIAGNOSIS — Z955 Presence of coronary angioplasty implant and graft: Secondary | ICD-10-CM

## 2018-06-23 DIAGNOSIS — Z7951 Long term (current) use of inhaled steroids: Secondary | ICD-10-CM

## 2018-06-23 DIAGNOSIS — I2510 Atherosclerotic heart disease of native coronary artery without angina pectoris: Secondary | ICD-10-CM | POA: Diagnosis present

## 2018-06-23 DIAGNOSIS — R402312 Coma scale, best motor response, none, at arrival to emergency department: Secondary | ICD-10-CM | POA: Diagnosis present

## 2018-06-23 DIAGNOSIS — J9600 Acute respiratory failure, unspecified whether with hypoxia or hypercapnia: Secondary | ICD-10-CM | POA: Diagnosis not present

## 2018-06-23 DIAGNOSIS — Z7982 Long term (current) use of aspirin: Secondary | ICD-10-CM

## 2018-06-23 DIAGNOSIS — Z66 Do not resuscitate: Secondary | ICD-10-CM | POA: Diagnosis present

## 2018-06-23 DIAGNOSIS — R9401 Abnormal electroencephalogram [EEG]: Secondary | ICD-10-CM | POA: Diagnosis present

## 2018-06-23 DIAGNOSIS — Z8249 Family history of ischemic heart disease and other diseases of the circulatory system: Secondary | ICD-10-CM

## 2018-06-23 LAB — CBC WITH DIFFERENTIAL/PLATELET
Abs Immature Granulocytes: 0.08 10*3/uL — ABNORMAL HIGH (ref 0.00–0.07)
Basophils Absolute: 0.1 10*3/uL (ref 0.0–0.1)
Basophils Relative: 1 %
EOS ABS: 0.1 10*3/uL (ref 0.0–0.5)
Eosinophils Relative: 2 %
HEMATOCRIT: 36.2 % (ref 36.0–46.0)
Hemoglobin: 11.3 g/dL — ABNORMAL LOW (ref 12.0–15.0)
Immature Granulocytes: 2 %
LYMPHS ABS: 1.5 10*3/uL (ref 0.7–4.0)
Lymphocytes Relative: 32 %
MCH: 29.4 pg (ref 26.0–34.0)
MCHC: 31.2 g/dL (ref 30.0–36.0)
MCV: 94 fL (ref 80.0–100.0)
MONO ABS: 0.4 10*3/uL (ref 0.1–1.0)
MONOS PCT: 9 %
Neutro Abs: 2.6 10*3/uL (ref 1.7–7.7)
Neutrophils Relative %: 54 %
Platelets: 162 10*3/uL (ref 150–400)
RBC: 3.85 MIL/uL — ABNORMAL LOW (ref 3.87–5.11)
RDW: 19.2 % — AB (ref 11.5–15.5)
WBC: 4.7 10*3/uL (ref 4.0–10.5)
nRBC: 0.4 % — ABNORMAL HIGH (ref 0.0–0.2)

## 2018-06-23 LAB — I-STAT ARTERIAL BLOOD GAS, ED
Acid-base deficit: 4 mmol/L — ABNORMAL HIGH (ref 0.0–2.0)
Bicarbonate: 21.1 mmol/L (ref 20.0–28.0)
O2 Saturation: 100 %
Patient temperature: 35.4
TCO2: 22 mmol/L (ref 22–32)
pCO2 arterial: 35.6 mmHg (ref 32.0–48.0)
pH, Arterial: 7.375 (ref 7.350–7.450)
pO2, Arterial: 198 mmHg — ABNORMAL HIGH (ref 83.0–108.0)

## 2018-06-23 LAB — GLUCOSE, CAPILLARY
Glucose-Capillary: 144 mg/dL — ABNORMAL HIGH (ref 70–99)
Glucose-Capillary: 224 mg/dL — ABNORMAL HIGH (ref 70–99)

## 2018-06-23 LAB — COMPREHENSIVE METABOLIC PANEL
ALT: 33 U/L (ref 0–44)
ANION GAP: 12 (ref 5–15)
AST: 48 U/L — ABNORMAL HIGH (ref 15–41)
Albumin: 2.9 g/dL — ABNORMAL LOW (ref 3.5–5.0)
Alkaline Phosphatase: 94 U/L (ref 38–126)
BILIRUBIN TOTAL: 0.9 mg/dL (ref 0.3–1.2)
BUN: 31 mg/dL — AB (ref 8–23)
CALCIUM: 7.6 mg/dL — AB (ref 8.9–10.3)
CO2: 19 mmol/L — ABNORMAL LOW (ref 22–32)
Chloride: 110 mmol/L (ref 98–111)
Creatinine, Ser: 2.95 mg/dL — ABNORMAL HIGH (ref 0.44–1.00)
GFR calc Af Amer: 17 mL/min — ABNORMAL LOW (ref >60)
GFR, EST NON AFRICAN AMERICAN: 15 mL/min — AB (ref >60)
Glucose, Bld: 160 mg/dL — ABNORMAL HIGH (ref 70–99)
POTASSIUM: 3.3 mmol/L — AB (ref 3.5–5.1)
Sodium: 141 mmol/L (ref 135–145)
TOTAL PROTEIN: 5.5 g/dL — AB (ref 6.5–8.1)

## 2018-06-23 LAB — LACTIC ACID, PLASMA: Lactic Acid, Venous: 4 mmol/L (ref 0.5–1.9)

## 2018-06-23 LAB — MAGNESIUM: Magnesium: 2.1 mg/dL (ref 1.7–2.4)

## 2018-06-23 LAB — I-STAT TROPONIN, ED: TROPONIN I, POC: 0.09 ng/mL — AB (ref 0.00–0.08)

## 2018-06-23 LAB — I-STAT CG4 LACTIC ACID, ED: Lactic Acid, Venous: 4.9 mmol/L (ref 0.5–1.9)

## 2018-06-23 LAB — MRSA PCR SCREENING: MRSA BY PCR: NEGATIVE

## 2018-06-23 MED ORDER — POTASSIUM CHLORIDE 20 MEQ/15ML (10%) PO SOLN
40.0000 meq | Freq: Once | ORAL | Status: AC
  Administered 2018-06-23: 40 meq
  Filled 2018-06-23: qty 30

## 2018-06-23 MED ORDER — NOREPINEPHRINE 4 MG/250ML-% IV SOLN
INTRAVENOUS | Status: AC
  Administered 2018-06-23: 10 ug/min via INTRAVENOUS
  Filled 2018-06-23: qty 250

## 2018-06-23 MED ORDER — ALBUTEROL SULFATE (2.5 MG/3ML) 0.083% IN NEBU: 2.5000 mg | INHALATION_SOLUTION | RESPIRATORY_TRACT | Status: DC | PRN

## 2018-06-23 MED ORDER — AMIODARONE HCL 200 MG PO TABS: 400.0000 mg | ORAL_TABLET | Freq: Every day | ORAL | Status: DC

## 2018-06-23 MED ORDER — MIDAZOLAM HCL 2 MG/2ML IJ SOLN: 1.0000 mg | INTRAMUSCULAR | Status: DC | PRN

## 2018-06-23 MED ORDER — ROSUVASTATIN CALCIUM 10 MG PO TABS
10.0000 mg | ORAL_TABLET | Freq: Every day | ORAL | Status: DC
  Administered 2018-06-23 – 2018-06-26 (×4): 10 mg
  Filled 2018-06-23 (×4): qty 1

## 2018-06-23 MED ORDER — AMIODARONE HCL 200 MG PO TABS
400.0000 mg | ORAL_TABLET | Freq: Three times a day (TID) | ORAL | Status: DC
  Administered 2018-06-23 – 2018-06-27 (×11): 400 mg
  Filled 2018-06-23 (×11): qty 2

## 2018-06-23 MED ORDER — MIDAZOLAM HCL 2 MG/2ML IJ SOLN
1.0000 mg | INTRAMUSCULAR | Status: DC | PRN
  Administered 2018-06-24: 1 mg via INTRAVENOUS
  Filled 2018-06-23: qty 2

## 2018-06-23 MED ORDER — FENTANYL CITRATE (PF) 100 MCG/2ML IJ SOLN
50.0000 ug | INTRAMUSCULAR | Status: DC | PRN
  Administered 2018-06-24 – 2018-06-25 (×2): 50 ug via INTRAVENOUS
  Filled 2018-06-23 (×2): qty 2

## 2018-06-23 MED ORDER — LEVOTHYROXINE SODIUM 100 MCG IV SOLR
87.5000 ug | Freq: Every day | INTRAVENOUS | Status: DC
  Administered 2018-06-23 – 2018-06-27 (×5): 87.5 ug via INTRAVENOUS
  Filled 2018-06-23 (×5): qty 5

## 2018-06-23 MED ORDER — INSULIN ASPART 100 UNIT/ML ~~LOC~~ SOLN
0.0000 [IU] | SUBCUTANEOUS | Status: DC
  Administered 2018-06-23: 1 [IU] via SUBCUTANEOUS
  Administered 2018-06-24: 2 [IU] via SUBCUTANEOUS
  Administered 2018-06-24: 3 [IU] via SUBCUTANEOUS
  Administered 2018-06-24 – 2018-06-27 (×6): 1 [IU] via SUBCUTANEOUS

## 2018-06-23 MED ORDER — SODIUM CHLORIDE 0.9 % IV SOLN
250.0000 mL | INTRAVENOUS | Status: DC
  Administered 2018-06-23 – 2018-06-26 (×2): 250 mL via INTRAVENOUS
  Administered 2018-06-27: 03:00:00 via INTRAVENOUS

## 2018-06-23 MED ORDER — SODIUM CHLORIDE 0.9 % IV SOLN
INTRAVENOUS | Status: AC | PRN
  Administered 2018-06-23 (×2): 1000 mL via INTRAVENOUS

## 2018-06-23 MED ORDER — DEXTROSE 5 % IV SOLN
INTRAVENOUS | Status: AC | PRN
  Administered 2018-06-23: 150 mg via INTRAVENOUS

## 2018-06-23 MED ORDER — FAMOTIDINE IN NACL 20-0.9 MG/50ML-% IV SOLN
20.0000 mg | Freq: Two times a day (BID) | INTRAVENOUS | Status: DC
  Administered 2018-06-23 – 2018-06-24 (×2): 20 mg via INTRAVENOUS
  Filled 2018-06-23 (×2): qty 50

## 2018-06-23 MED ORDER — NOREPINEPHRINE 4 MG/250ML-% IV SOLN
2.0000 ug/min | INTRAVENOUS | Status: DC
  Administered 2018-06-23: 10 ug/min via INTRAVENOUS
  Administered 2018-06-24: 6 ug/min via INTRAVENOUS
  Filled 2018-06-23: qty 250

## 2018-06-23 MED ORDER — ASPIRIN 81 MG PO CHEW
81.0000 mg | CHEWABLE_TABLET | Freq: Every day | ORAL | Status: DC
  Administered 2018-06-23 – 2018-06-27 (×5): 81 mg
  Filled 2018-06-23 (×5): qty 1

## 2018-06-23 MED ORDER — HEPARIN SODIUM (PORCINE) 5000 UNIT/ML IJ SOLN
5000.0000 [IU] | Freq: Three times a day (TID) | INTRAMUSCULAR | Status: DC
  Administered 2018-06-23 – 2018-06-27 (×11): 5000 [IU] via SUBCUTANEOUS
  Filled 2018-06-23 (×10): qty 1

## 2018-06-23 MED ORDER — FENTANYL CITRATE (PF) 100 MCG/2ML IJ SOLN
50.0000 ug | INTRAMUSCULAR | Status: AC | PRN
  Administered 2018-06-23 – 2018-06-24 (×3): 50 ug via INTRAVENOUS
  Filled 2018-06-23 (×3): qty 2

## 2018-06-23 MED ORDER — ORAL CARE MOUTH RINSE
15.0000 mL | OROMUCOSAL | Status: DC
  Administered 2018-06-23 – 2018-06-27 (×38): 15 mL via OROMUCOSAL

## 2018-06-23 MED ORDER — CHLORHEXIDINE GLUCONATE 0.12% ORAL RINSE (MEDLINE KIT)
15.0000 mL | Freq: Two times a day (BID) | OROMUCOSAL | Status: DC
  Administered 2018-06-23 – 2018-06-27 (×8): 15 mL via OROMUCOSAL

## 2018-06-23 NOTE — ED Triage Notes (Signed)
Pt brought in by EMS post CPR. Pt was a witness arrested and received 10 minutes of CPR. Pt received 2 epis and shocked x3. Pt has hx of MI and PPM.

## 2018-06-23 NOTE — H&P (Addendum)
NAME:  Crystal Duran, MRN:  [id number], DOB:  Jul 06, 1946, LOS: 0 ADMISSION DATE:  06/19/2018, CONSULTATION DATE:  06/05/2018 REFERRING MD:  Juleen China CHIEF COMPLAINT:  Cardiac Arrest   Brief History   Crystal Duran is a 72 y.o. 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  She presented to [facility] 10/25 after PEA arrest which then progressed to VF.  She had 5 minutes of downtime before any CPR was started, and then an additional 5 minutes or so prior to ROSC.  In ED, she was found to have a large right sided PTX post CPR for which a small bore chest tube was placed by EDP.  Past Medical History  Near end stage ICM, sCHF, MI, HTN, HLD, CKD, DM, hypothyroidism.  Significant Hospital Events   10/25 > admit.  Consults: date of consult/date signed off & final recs:  None.  Procedures (surgical and bedside):  ETT 10/25 > Right small bore chest tube 10/25 >   Significant Diagnostic Tests:  CXR 10/25 >  large right PTX, pulmonary edema. CT head 10/25 > no acute process.  Micro Data:  Blood 10/25 >  Sputum 10/25 >  Urine 10/25 >   Antimicrobials:  None.   Subjective:  Comfortable on vent. Unresponsive.  Objective:  Blood pressure (!) 85/57, pulse (!) 58, temperature (!) 95.7 F (35.4 C), resp. rate (!) 39, height 5\' 4"  (1.626 m), weight 60.9 kg, SpO2 100 %.    Vent Mode: PRVC FiO2 (%):  [100 %] 100 % Set Rate:  [16 bmp] 16 bmp Vt Set:  [430 mL] 430 mL PEEP:  [5 cmH20] 5 cmH20 Plateau Pressure:  [20 cmH20] 20 cmH20   Intake/Output Summary (Last 24 hours) at 06/01/2018 1733 Last data filed at 05/31/2018 1656 Gross per 24 hour  Intake 1500 ml  Output -  Net 1500 ml   Filed Weights   05/30/2018 1641  Weight: 60.9 kg    Examination: General: Elderly female, critically ill. Neuro: Unresponsive, no sedation. HEENT: [place]/AT. Sclerae anicteric Pupils pinpoint.  ETT in place. Cardiovascular: RRR, no M/R/G.  Lungs: Respirations even and unlabored.  Coarse  bilaterally. Abdomen: BS x 4, soft, NT/ND.  Musculoskeletal: No gross deformities, no edema.  Skin: Intact, warm, no rashes.  Assessment & Plan:   PEA arrest - unclear etiology.  Per discussions with EP, pt has been in and out of VT for quite some time now.  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 - Admit to ICU. - Continue supportive care but DNR in the event of recurrent arrest. - Trend lactate, troponin. - Continue preadmission ASA,  amiodarone but increase dose and frequency to 400mg  TID after discussion with EP. - Day team to continue discussions with husband regarding turning off shock features of ICD (not ready for this on admission).  Shock - presumed cardiogenic due to underlying near end stage ICM.  Exacerbated by pneumothorax. - Levophed started in ED, would continue peripherally with max dose of 79mcg/kg/min.  Hx HTN, HLD, MI. - Continue preadmission rosuvastatin. - Hold preadission carvedilol, furosemide, entresto.  Right pneumothorax - presumed secondary to CPR.  S/p small bore chest tube placement in ED. - Chest tube to 20cm suction. - Follow CXR.  Respiratory insufficiency - due to above. - Full vent support. - SBT only if mental status improves (doubtful for recovery given concern for anoxic injury).  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 - Consider EEG vs repeat  CT head if mental status does not improve.  Hypokalemia. - 40 mEq K per tube.  AoCKD. Lactic acidosis. - Correct electrolytes as indicated. - Follow BMP.  Hx DM. - SSI. - Hold preadmission metformin.  Hx hypthoyrodism. - Continue preadmission synthroid, change to IV formulation.  Disposition / Summary of Today's Plan 06/24/2018   ICU.  Continue supportive care but DNR in the event of recurrent arrest.    Diet: NPO. Pain/Anxiety/Delirium protocol (if indicated): Fentanyl PRN / Midazolam PRN. VAP protocol (if indicated): In place. DVT  prophylaxis: SCD's / Heparin. GI prophylaxis: Famotidine. Hyperglycemia protocol: SSI. Mobility: Bedrest. Code Status: DNR. Family Communication: Husband updated in ED conference room.  Extensive discussions were had with him. We discussed her underlying near end stage heart disease, current circumstances, organ failures, and extremely poor prognosis given her out of hospital arrest. We also discussed patient's prior wishes under circumstances such as this. The decision was made not to perform resuscitation if arrest were to occur, but to otherwise continue with current medical support / therapies.  Labs   CBC: Recent Labs  Lab 06/21/18 1646  WBC 3.9*  NEUTROABS 2.3  HGB 12.4  HCT 39.6  MCV 93.2  PLT 183   Basic Metabolic Panel: Recent Labs  Lab 06/21/18 1646  NA 139  K 3.4*  3.4*  CL 102  CO2 22  GLUCOSE 138*  BUN 40*  CREATININE 3.26*  CALCIUM 8.9  MG 2.6*   GFR: Estimated Creatinine Clearance: 13.3 mL/min (A) (by C-G formula based on SCr of 3.26 mg/dL (H)). Recent Labs  Lab 06/21/18 1646 06/09/2018 1659  WBC 3.9*  --   LATICACIDVEN  --  4.90*   Liver Function Tests: No results for input(s): AST, ALT, ALKPHOS, BILITOT, PROT, ALBUMIN in the last 168 hours. No results for input(s): LIPASE, AMYLASE in the last 168 hours. No results for input(s): AMMONIA in the last 168 hours. ABG    Component Value Date/Time   PHART 7.333 (L) 02/10/2010 1038   PCO2ART 32.6 (L) 02/10/2010 1038   PO2ART 87.3 02/10/2010 1038   HCO3 16.7 (L) 02/10/2010 1038   TCO2 17.6 02/10/2010 1038   ACIDBASEDEF 7.9 (H) 02/10/2010 1038   O2SAT 96.6 02/10/2010 1038    Coagulation Profile: No results for input(s): INR, PROTIME in the last 168 hours. Cardiac Enzymes: No results for input(s): CKTOTAL, CKMB, CKMBINDEX, TROPONINI in the last 168 hours. HbA1C: Hgb A1c MFr Bld  Date/Time Value Ref Range Status  04/07/2018 09:56 PM 5.9 (H) 4.8 - 5.6 % Final    Comment:    (NOTE) Pre diabetes:           5.7%-6.4% Diabetes:              >6.4% Glycemic control for   <7.0% adults with diabetes   07/11/2017 10:00 PM 6.2 (H) 4.8 - 5.6 % Final    Comment:    (NOTE) Pre diabetes:          5.7%-6.4% Diabetes:              >6.4% Glycemic control for   <7.0% adults with diabetes    CBG: No results for input(s): GLUCAP in the last 168 hours.  Admitting History of Present Illness.   Pt is encephelopathic; therefore, this HPI is obtained from chart review. Crystal Duran is a 72 y.o. female who has a PMH as outlined below including but not limited to near end stage ICM and CHF (last echo from Nov 2018  with EF 10 - 15%, mild AR, severe MR, mild LA dilation).  She presented to [facility] ED 10/25 after a cardiac arrest.  Husband witnessed her collapse.  EMS was called but CPR was NOT immediately started.  Per report, EMS arrived around 5 minutes later.  Upon their arrival, she was found to be in PEA which then progressed to VF.  She received defib and epinephrine x 3 prior to ROSC.  She was then intubated and transported to the ED for further eval.  In ED, CXR demonstrated right sided pneumothorax presumed due to CPR.  CT of the head was obtained and results are pending.  GCS upon arrival to ED was 3 and has remained so since pt has been here.  She has not received any sedation.  Per last EP note from 06/09/18, Dr. Graciela Husbands had informed pt and husband that he was concerned that pt may be reaching end stage cardiomyopathy with ventricular arrhythmias being a manifestation of that.  She was apparently going to start working on her living will.  In ED, I had extensive discussions with Mr. Nudo, pt's spouse. We discussed her underlying near end stage heart disease, current circumstances, organ failures, and extremely poor prognosis given her out of hospital arrest. We also discussed patient's prior wishes under circumstances such as this. The decision was made not to perform resuscitation if arrest were to  occur, but to otherwise continue with current medical support / therapies.  I also discussed her case with the EP tea who has recommended continuing PO amiodarone at a dose of 400mg  TID.  Pt's husband informed EP team that he is NOT ready for her ICD to be turned off; therefore, this will remain on for now.  She will however not receive external defibrillation in the event of arrest.  Review of Systems:   Unable to obtain as pt is encephalopathic.  Past medical history  She,  has a past medical history of AICD (automatic cardioverter/defibrillator) present, Arthritis, CAP (community acquired pneumonia) (04/07/2018), Cardiac arrest - ventricular fibrillation (01/2010), CHF (congestive heart failure) (HCC), Chronic lower back pain, CKD (chronic kidney disease), stage III (HCC), History of tobacco abuse, HTN (hypertension), Hypercholesteremia, Hypothyroidism, Ischemic cardiomyopathy, MI (myocardial infarction) (HCC) (02/2009), On home oxygen therapy, Pneumonia, and Type 2 diabetes mellitus (HCC).   Surgical History    Past Surgical History:  Procedure Laterality Date  . ABDOMINAL HYSTERECTOMY    . CARDIAC DEFIBRILLATOR PLACEMENT  2011   single chamber. St Jude Fortify ICD 578-46N. Remote- no.   . CORONARY ANGIOPLASTY WITH STENT PLACEMENT  02/2009   [name] 02/07/2010     Social History   Social History   Socioeconomic History  . Marital status: Married    Spouse name: Not on file  . Number of children: Not on file  . Years of education: Not on file  . Highest education level: Not on file  Occupational History  . Occupation: retired  Engineer, production  . Financial resource strain: Not on file  . Food insecurity:    Worry: Not on file    Inability: Not on file  . Transportation needs:    Medical: Not on file    Non-medical: Not on file  Tobacco Use  . Smoking status: Former Smoker    Packs/day: 0.50    Years: 5.00    Pack years: 2.50    Types: Cigarettes    Last attempt to quit:  1995    Years since quitting: 24.8  . Smokeless  tobacco: Never Used  Substance and Sexual Activity  . Alcohol use: Not Currently  . Drug use: Never  . Sexual activity: Not Currently  Lifestyle  . Physical activity:    Days per week: Not on file    Minutes per session: Not on file  . Stress: Not on file  Relationships  . Social connections:    Talks on phone: Not on file    Gets together: Not on file    Attends religious service: Not on file    Active member of club or organization: Not on file    Attends meetings of clubs or organizations: Not on file    Relationship status: Not on file  . Intimate partner violence:    Fear of current or ex partner: Not on file    Emotionally abused: Not on file    Physically abused: Not on file    Forced sexual activity: Not on file  Other Topics Concern  . Not on file  Social History Narrative   Married, lives in [place], retired Runner, broadcasting/film/video.    ,  reports that she quit smoking about 24 years ago. Her smoking use included cigarettes. She has a 2.50 pack-year smoking history. She has never used smokeless tobacco. She reports that she drank alcohol. She reports that she does not use drugs.   Family history   Her family history includes Coronary artery disease in her other.   Allergies Allergies  Allergen Reactions  . Eggs Or Egg-Derived Products Nausea And Vomiting    Home meds  Prior to Admission medications   Medication Sig Start Date End Date Taking? Authorizing Provider  albuterol (PROVENTIL HFA;VENTOLIN HFA) 108 (90 Base) MCG/ACT inhaler Inhale 1-2 puffs into the lungs every 4 (four) hours as needed for wheezing or shortness of breath.    [provider]  amiodarone (PACERONE) 200 MG tablet Oct 11-25, 400mg  (two tabs) 2x per day; Oct 26-Nov 7, 200mg  (One tab) 2x per day; Nov 8, 200mg  (One Tab) 1x per day 06/09/18   Duke Salvia, MD  aspirin EC 81 MG tablet Take 81 mg by mouth daily.    [provider]   budesonide-formoterol (SYMBICORT) 80-4.5 MCG/ACT inhaler Inhale 2 puffs into the lungs 2 (two) times daily. 05/21/18   Vassie Loll, MD  carvedilol (COREG) 25 MG tablet Take 0.5 tablets (12.5 mg total) by mouth 2 (two) times daily. 06/29/16   Rinaldo Cloud, MD  dextromethorphan-guaiFENesin (MUCINEX DM) 30-600 MG 12hr tablet Take 1 tablet by mouth 2 (two) times daily. Patient not taking: Reported on 06/21/2018 05/21/18   Vassie Loll, MD  furosemide (LASIX) 40 MG tablet Take 0.5 tablets (20 mg total) by mouth daily. Patient taking differently: Take 80 mg by mouth 2 (two) times daily.  05/21/18   Vassie Loll, MD  levothyroxine (SYNTHROID, LEVOTHROID) 175 MCG tablet Take 175 mcg by mouth daily before breakfast. 03/27/18   [provider]  metFORMIN (GLUCOPHAGE) 500 MG tablet Take 500 mg by mouth daily.    [provider]  nitroGLYCERIN (NITROSTAT) 0.4 MG SL tablet Place 1 tablet (0.4 mg total) under the tongue every 5 (five) minutes x 3 doses as needed for chest pain. 06/29/16   Rinaldo Cloud, MD  OXYGEN Inhale 2 L into the lungs continuous.    [provider]  rosuvastatin (CRESTOR) 20 MG tablet Take 0.5 tablets (10 mg total) by mouth daily. 04/11/18   Rinaldo Cloud, MD  sacubitril-valsartan (ENTRESTO) 97-103 MG Take 1 tablet by mouth  2 (two) times daily. HOLD UNTIL FOLLOW UP WITH CARDIOLOGY SERVICE. 05/21/18   Vassie Loll, MD    Critical care time: 45 min.    Rutherford Guys, [place] [facility] Pager: [phone]  or [phone] 06/26/2018, 6:44 PM

## 2018-06-23 NOTE — Progress Notes (Addendum)
Patient presented to the hospital with cardiac arrest. ICD to be interrogated. Patient made DNR by CCM today. Has pneumothorax and chest tube being inserted. Patient on norepi currently for BP control. Crystal Duran not turn off ICD per husband but he may wish it turned off at another time. Would continue amiodarone. If more VT, would also add lidocaine at 1-2mg /kg. Device interrogation shows course VF with 15 ICD shocks. Initial episode at 15:40 with last episode at 15:50.  Crystal Brooklyn, MD

## 2018-06-23 NOTE — ED Provider Notes (Signed)
[facility] EMERGENCY DEPARTMENT Provider Note   CSN: [id number] Arrival date & time: 06/19/2018  1635     History   Chief Complaint Chief Complaint  Patient presents with  . Post CPR    HPI PUANANI GENE is a 72 y.o. female.  HPI Patient is a 72 year old female with a past medical history of CHF, previous cardiac arrest status post AICD placement, previous MI, ischemic cardiomyopathy, hypothyroidism, hypertension, and CKD presents the emergency department for evaluation following a witnessed arrest.  Patient was at home with her husband at which time he witnessed her collapse.  CPR not immediately started.  Upon arrival EMS reports patient was pulseless and unresponsive.  CPR initiated immediately.  Patient initially in PEA which then changed to ventricular fibrillation.  Patient was shocked x3 and received epinephrine.  After this patient had return of spontaneous circulation.  Patient was then intubated and transported to the emergency department for further evaluation.  Upon arrival to the emergency department, patient intubated with a GCS of 3.  No further interventions necessary in route to the emergency department.  Of note, patient was seen in the emergency department 2 days ago at which time her AICD fired and after interrogation patient was found to have an episode of ventricular fibrillation.  They report husband said she had been in her normal state of health.  Review of systems unable to be obtained secondary to patient being intubated with a GCS of 3.  Past Medical History:  Diagnosis Date  . AICD (automatic cardioverter/defibrillator) present    [employer] Fortify ICD [id number] [id number] [id number]  . Arthritis    "hands, back" (04/07/2018)  . CAP (community acquired pneumonia) 04/07/2018  . 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 CHF (congestive heart failure) (HCC)    systolic  . Chronic lower back pain   . CKD (chronic kidney disease), stage III  ([place])   . History of tobacco abuse   . HTN (hypertension)   . Hypercholesteremia   . Hypothyroidism   . Ischemic cardiomyopathy    severe  . MI (myocardial infarction) ([place]) 02/2009   Hx of inferior wall MI   . On home oxygen therapy    "2-4L prn" (04/07/2018)  . Pneumonia    "once before today" (04/07/2018)  . Type 2 diabetes mellitus [facility])     Patient Active Problem List   Diagnosis Date Noted  . Cardiac arrest ([place]) 06/08/2018  . Acute respiratory failure ([place])   . Acute renal failure superimposed on stage 3 chronic kidney disease ([place])   . Multifocal pneumonia 05/17/2018  . Malnutrition of moderate degree 04/09/2018  . Community acquired pneumonia 04/07/2018  . Hypokalemia 07/15/2017  . Acute kidney injury superimposed on chronic kidney disease ([place]) 07/13/2017  . Hypothyroidism 07/12/2017  . Mild protein-calorie malnutrition ([place]) 07/12/2017  . Diabetes mellitus type 2 in nonobese ([place]) 07/12/2017  . CHF exacerbation ([place]) 07/11/2017  . Acute systolic congestive heart failure ([place]) 06/25/2016  . aborted cardiac arrest 03/23/2011  . Chronic systolic congestive heart failure ([place]) 03/27/2011  . HYPERTENSION, BENIGN 06/01/2010  . CAD 06/01/2010  . CARDIOMYOPATHY, ISCHEMIC 06/01/2010  . IMPLANTATION OF DEFIBRILLATOR,[employer] FORTIFY ICD 123-40Q 06/01/2010    Past Surgical History:  Procedure Laterality Date  . ABDOMINAL HYSTERECTOMY    . CARDIAC DEFIBRILLATOR PLACEMENT  2011   single chamber. [place] ICD 601-09N. Remote- no.   . CORONARY ANGIOPLASTY WITH STENT PLACEMENT  02/2009   Archie Endo 02/07/2010  OB History   None      Home Medications    Prior to Admission medications   Medication Sig Start Date End Date Taking? Authorizing Provider  albuterol (PROVENTIL HFA;VENTOLIN HFA) 108 (90 Base) MCG/ACT inhaler Inhale 1-2 puffs into the lungs every 4 (four) hours as needed for wheezing or shortness of breath.    [provider]  amiodarone  (PACERONE) 200 MG tablet Oct 11-25, 425m (two tabs) 2x per day; Oct 26-Nov 7, 2065m(One tab) 2x per day; Nov 8, 20075mOne Tab) 1x per day 06/09/18   KleDeboraha SprangD  aspirin EC 81 MG tablet Take 81 mg by mouth daily.    [provider]  budesonide-formoterol (SYMBICORT) 80-4.5 MCG/ACT inhaler Inhale 2 puffs into the lungs 2 (two) times daily. 05/21/18   MadBarton DuboisD  carvedilol (COREG) 25 MG tablet Take 0.5 tablets (12.5 mg total) by mouth 2 (two) times daily. 06/29/16   HarCharolette ForwardD  dextromethorphan-guaiFENesin (MUCINEX DM) 30-600 MG 12hr tablet Take 1 tablet by mouth 2 (two) times daily. Patient not taking: Reported on 06/21/2018 05/21/18   MadBarton DuboisD  furosemide (LASIX) 40 MG tablet Take 0.5 tablets (20 mg total) by mouth daily. Patient taking differently: Take 80 mg by mouth 2 (two) times daily.  05/21/18   MadBarton DuboisD  levothyroxine (SYNTHROID, LEVOTHROID) 175 MCG tablet Take 175 mcg by mouth daily before breakfast. 03/27/18   [provider]  metFORMIN (GLUCOPHAGE) 500 MG tablet Take 500 mg by mouth daily.    [provider]  nitroGLYCERIN (NITROSTAT) 0.4 MG SL tablet Place 1 tablet (0.4 mg total) under the tongue every 5 (five) minutes x 3 doses as needed for chest pain. 06/29/16   HarCharolette ForwardD  OXYGEN Inhale 2 L into the lungs continuous.    [provider]  rosuvastatin (CRESTOR) 20 MG tablet Take 0.5 tablets (10 mg total) by mouth daily. 04/11/18   HarCharolette ForwardD  sacubitril-valsartan (ENTRESTO) 97-103 MG Take 1 tablet by mouth 2 (two) times daily. HOLD UNTIL FOLLOW UP WITH CARDIOLOGY SERVICE. 05/21/18   MadBarton DuboisD    Family History Family History  Problem Relation Age of Onset  . Coronary artery disease Other        postive family Hx    Social History Social History   Tobacco Use  . Smoking status: Former Smoker    Packs/day: 0.50    Years: 5.00    Pack years: 2.50    Types: Cigarettes     Last attempt to quit: 1995    Years since quitting: 24.8  . Smokeless tobacco: Never Used  Substance Use Topics  . Alcohol use: Not Currently  . Drug use: Never     Allergies   Eggs or egg-derived products   Review of Systems Review of Systems  Unable to perform ROS: Intubated     Physical Exam Updated Vital Signs BP 99/68   Pulse (!) 59   Temp (!) 91.2 F (32.9 C) (Axillary)   Resp 16   Ht '5\' 4"'  (1.626 m)   Wt 60.9 kg   SpO2 100%   BMI 23.05 kg/m   Physical Exam  Constitutional: She appears well-developed.  HENT:  Head: Normocephalic and atraumatic.  Eyes:  Pupils pinpoint bilaterally.  Neck: Neck supple.  Cardiovascular: Normal rate, regular rhythm and intact distal pulses.  Pulmonary/Chest:  Patient intubated.  Initially diminished breath sounds on the right side.  Improved following repositioning of  ET tube.  Abdominal: She exhibits no distension.  Musculoskeletal: She exhibits no edema.  Neurological:  GCS of 3.  No meaningful movements.  Skin: Skin is warm and dry.  Psychiatric:  Unable to assess.  Nursing note and vitals reviewed.    ED Treatments / Results  Labs (all labs ordered are listed, but only abnormal results are displayed) Labs Reviewed  CBC WITH DIFFERENTIAL/PLATELET - Abnormal; Notable for the following components:      Result Value   RBC 3.85 (*)    Hemoglobin 11.3 (*)    RDW 19.2 (*)    nRBC 0.4 (*)    Abs Immature Granulocytes 0.08 (*)    All other components within normal limits  COMPREHENSIVE METABOLIC PANEL - Abnormal; Notable for the following components:   Potassium 3.3 (*)    CO2 19 (*)    Glucose, Bld 160 (*)    BUN 31 (*)    Creatinine, Ser 2.95 (*)    Calcium 7.6 (*)    Total Protein 5.5 (*)    Albumin 2.9 (*)    AST 48 (*)    GFR calc non Af Amer 15 (*)    GFR calc Af Amer 17 (*)    All other components within normal limits  GLUCOSE, CAPILLARY - Abnormal; Notable for the following components:    Glucose-Capillary 144 (*)    All other components within normal limits  I-STAT TROPONIN, ED - Abnormal; Notable for the following components:   Troponin i, poc 0.09 (*)    All other components within normal limits  I-STAT CG4 LACTIC ACID, ED - Abnormal; Notable for the following components:   Lactic Acid, Venous 4.90 (*)    All other components within normal limits  I-STAT ARTERIAL BLOOD GAS, ED - Abnormal; Notable for the following components:   pO2, Arterial 198.0 (*)    Acid-base deficit 4.0 (*)    All other components within normal limits  CULTURE, BLOOD (ROUTINE X 2)  CULTURE, BLOOD (ROUTINE X 2)  URINE CULTURE  CULTURE, RESPIRATORY  MRSA PCR SCREENING  MAGNESIUM  CBC  BASIC METABOLIC PANEL  BLOOD GAS, ARTERIAL  MAGNESIUM  PHOSPHORUS  LACTIC ACID, PLASMA  LACTIC ACID, PLASMA  TROPONIN I  TROPONIN I    EKG None  Radiology Ct Head Wo Contrast  Result Date: 05/30/2018 CLINICAL DATA:  Unexplained altered level of consciousness. Witnessed cardiac arrest. EXAM: CT HEAD WITHOUT CONTRAST TECHNIQUE: Contiguous axial images were obtained from the base of the skull through the vertex without intravenous contrast. COMPARISON:  02/07/2010 FINDINGS: Brain: No evidence of acute infarction, hemorrhage, hydrocephalus, extra-axial collection or mass lesion/mass effect. Anterior and inferior bifrontal encephalomalacia with superficial clips in the midline. Vascular: Atherosclerotic calcification. Skull: Unremarkable bifrontal craniotomy.  No acute finding Sinuses/Orbits: Nasopharyngeal fluid in the setting of intubation. IMPRESSION: No acute finding or change since 2011. Electronically Signed   By: Monte Fantasia M.D.   On: 05/31/2018 17:48   Dg Chest Portable 1 View  Result Date: 06/02/2018 CLINICAL DATA:  Chest tube placement EXAM: PORTABLE CHEST 1 VIEW COMPARISON:  06/05/2018, 05/18/2018, 05/17/2017 CT FINDINGS: Endotracheal tube tip is about 4.8 cm superior to the carina. Esophageal  tube tip is below the diaphragm but not included. Interim placement of right-sided chest tube with curled tip positioned over the right upper lateral chest. Decreased right pneumothorax with trace lateral residual pneumothorax. Left sided pacing device as before. Worsened consolidation in the right upper lobe. Cardiomegaly. Aortic atherosclerosis. IMPRESSION: 1. Interim placement  of right-sided chest tube tip positioned over the right upper lateral chest. Decreased right pneumothorax with trace residual right lateral pneumothorax noted. 2. Interim worsening in right upper lobe consolidation. 3. Cardiomegaly Electronically Signed   By: Donavan Foil M.D.   On: 06/18/2018 18:16   Dg Chest Port 1 View  Result Date: 06/09/2018 CLINICAL DATA:  Cardiac arrest. EXAM: PORTABLE CHEST 1 VIEW COMPARISON:  05/07/2018 FINDINGS: Cardiac pads overlying the chest. Right pneumothorax measuring 20-30%. Endotracheal tube tip appears to be near the clavicular heads. Patient is mildly rotated towards the left. Patient has a left cardiac ICD. Evidence for interstitial pulmonary edema. Nasogastric tube extends into the abdomen. No obvious or displaced rib fractures. Heart size is difficult to assess due to patient rotation. IMPRESSION: 1. Right pneumothorax measuring 20-30%. 2. Endotracheal tube is appropriately positioned. 3. Pulmonary edema. These results were called by telephone at the time of interpretation on 06/21/2018 at 5:09 pm to Dr. Virgel Manifold, who verbally acknowledged these results. Electronically Signed   By: Markus Daft M.D.   On: 06/14/2018 17:11    Procedures CHEST TUBE INSERTION Date/Time: 06/07/2018 5:49 PM Performed by: Melina Copa, MD Authorized by: Melina Copa, MD   Consent:    Consent obtained:  Emergent situation   Alternatives discussed:  No treatment Pre-procedure details:    Skin preparation:  ChloraPrep   Preparation: Patient was prepped and draped in the usual sterile fashion     Sedation:    Sedation type: None, patient unresponsive. Procedure details:    Placement location:  R lateral   Scalpel size:  11   Tube size (Fr):  20   Ultrasound guidance: no     Tension pneumothorax: no     Tube connected to:  Suction   Drainage characteristics:  Serosanguinous   Suture material:  0 silk   Dressing:  Petrolatum-impregnated gauze Post-procedure details:    Post-insertion x-ray findings: tube in good position     Patient tolerance of procedure:  Tolerated well, no immediate complications .Critical Care Performed by: Melina Copa, MD Authorized by: Melina Copa, MD   Critical care provider statement:    Critical care time (minutes):  30   Critical care time was exclusive of:  Separately billable procedures and treating other patients   Critical care was necessary to treat or prevent imminent or life-threatening deterioration of the following conditions:  Cardiac failure   Critical care was time spent personally by me on the following activities:  Development of treatment plan with patient or surrogate, discussions with consultants, evaluation of patient's response to treatment, examination of patient, review of old charts, ordering and review of radiographic studies, ordering and review of laboratory studies and ordering and performing treatments and interventions   I assumed direction of critical care for this patient from another provider in my specialty: no     (including critical care time)  Medications Ordered in ED Medications  heparin injection 5,000 Units (has no administration in time range)  famotidine (PEPCID) IVPB 20 mg premix (has no administration in time range)  albuterol (PROVENTIL) (2.5 MG/3ML) 0.083% nebulizer solution 2.5 mg (has no administration in time range)  aspirin chewable tablet 81 mg (has no administration in time range)  levothyroxine (SYNTHROID, LEVOTHROID) injection 87.5 mcg (has no administration in time range)   amiodarone (PACERONE) tablet 400 mg (has no administration in time range)  0.9 %  sodium chloride infusion (has no administration in time range)  norepinephrine (LEVOPHED) 74m  in D5W 210m premix infusion (10 mcg/min Intravenous Rate/Dose Change 06/10/2018 1723)  insulin aspart (novoLOG) injection 0-9 Units (has no administration in time range)  rosuvastatin (CRESTOR) tablet 10 mg (has no administration in time range)  fentaNYL (SUBLIMAZE) injection 50 mcg (has no administration in time range)  fentaNYL (SUBLIMAZE) injection 50 mcg (has no administration in time range)  midazolam (VERSED) injection 1 mg (has no administration in time range)  midazolam (VERSED) injection 1 mg (has no administration in time range)  potassium chloride 20 MEQ/15ML (10%) solution 40 mEq (has no administration in time range)  chlorhexidine gluconate (MEDLINE KIT) (PERIDEX) 0.12 % solution 15 mL (has no administration in time range)  MEDLINE mouth rinse (has no administration in time range)  amiodarone (CORDARONE) 150 mg in dextrose 5 % 100 mL bolus ( Intravenous Stopped 06/22/2018 1655)  norepinephrine (LEVOPHED) 4-5 MG/250ML-% infusion SOLN (10 mcg/min  New Bag/Given 06/18/2018 1703)  0.9 %  sodium chloride infusion (1,000 mLs Intravenous New Bag/Given 06/29/2018 1700)     Initial Impression / Assessment and Plan / ED Course  I have reviewed the triage vital signs and the nursing notes.  Pertinent labs & imaging results that were available during my care of the patient were reviewed by me and considered in my medical decision making (see chart for details).     Patient is a 72year old female with past medical history as detailed above who presents emergency department status post cardiac arrest.  As mentioned in HPI patient initially found to have PEA which then changed to ventricular fibrillation for which she received 3 shocks and epinephrine with return of spontaneous circulation.  Upon arrival to the emergency  department patient intubated and unresponsive.  She has no meaningful movements.  At that time patient's rhythm had changed to sinus rhythm.  She had a pulse and adequate blood pressure.  Initially patient with diminished breath sounds on the right side which improved following repositioning patient's ET tube.  After confirming ABCs, laboratory and imaging studies were obtained.  Results of the studies are detailed above.  Secondary to patient's pneumothorax a right-sided chest tube was placed as detailed above in the procedure note.  Patient also given a bolus of amiodarone.  Both cardiology and critical care medicine consulted to evaluate the patient.  After speaking with the patient's family, the patient was made DNR.  She was then ad,itted for further evaluation and care.  No further acute events while in the emergency department.  The care of this patient was discussed with my attending physician Dr. KWilson Singer who voices agreement with work-up and ED disposition.   Final Clinical Impressions(s) / ED Diagnoses   Final diagnoses:  Cardiac arrest ([facility]    ED Discharge Orders    None      Mathilde Mcwherter, RChanda Busing MD 06/18/2018 2036  KVirgel Manifold MD 06/24/18 1720

## 2018-06-23 NOTE — Progress Notes (Signed)
   06/25/2018 1700  Clinical Encounter Type  Visited With Family  Visit Type Initial  Referral From Nurse  Consult/Referral To Chaplain  Spiritual Encounters  Spiritual Needs Prayer  Stress Factors  Patient Stress Factors None identified  Family Stress Factors None identified   Responded to page to D35 in the ED. Husband was in consultation room. I offered spiritual care with words of encouragement and prayer. Chaplain available upon request.   Chaplain Orest Dikes [phone]

## 2018-06-23 NOTE — Progress Notes (Signed)
Pt transported on vent to CT and returned to ED D35 without complications.
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
## 2018-06-23 NOTE — Progress Notes (Signed)
CRITICAL VALUE ALERT  Critical Value:  Lactic acid 4.0  Date & Time Notied:  July 23, 2018 2116  Provider Notified: Roxana Hires, RN/Dr. Vassie Loll  Orders Received/Actions taken: Lactic acid trending down from 4.9. No additional orders at this time.

## 2018-06-23 NOTE — Progress Notes (Signed)
Pt transported on vent from ED to 2H14 without complications.

## 2018-06-24 ENCOUNTER — Inpatient Hospital Stay (HOSPITAL_COMMUNITY): Payer: Medicare HMO

## 2018-06-24 DIAGNOSIS — G253 Myoclonus: Secondary | ICD-10-CM

## 2018-06-24 LAB — GLUCOSE, CAPILLARY
Glucose-Capillary: 151 mg/dL — ABNORMAL HIGH (ref 70–99)
Glucose-Capillary: 113 mg/dL — ABNORMAL HIGH (ref 70–99)
Glucose-Capillary: 118 mg/dL — ABNORMAL HIGH (ref 70–99)
Glucose-Capillary: 124 mg/dL — ABNORMAL HIGH (ref 70–99)
Glucose-Capillary: 104 mg/dL — ABNORMAL HIGH (ref 70–99)
Glucose-Capillary: 119 mg/dL — ABNORMAL HIGH (ref 70–99)

## 2018-06-24 LAB — BASIC METABOLIC PANEL
Anion gap: 15 (ref 5–15)
BUN: 30 mg/dL — AB (ref 8–23)
CO2: 19 mmol/L — ABNORMAL LOW (ref 22–32)
CREATININE: 2.86 mg/dL — AB (ref 0.44–1.00)
Calcium: 8 mg/dL — ABNORMAL LOW (ref 8.9–10.3)
Chloride: 106 mmol/L (ref 98–111)
GFR calc Af Amer: 18 mL/min — ABNORMAL LOW (ref >60)
GFR, EST NON AFRICAN AMERICAN: 15 mL/min — AB (ref >60)
GLUCOSE: 201 mg/dL — AB (ref 70–99)
Potassium: 3.3 mmol/L — ABNORMAL LOW (ref 3.5–5.1)
Sodium: 140 mmol/L (ref 135–145)

## 2018-06-24 LAB — CBC
HCT: 39.2 % (ref 36.0–46.0)
Hemoglobin: 12 g/dL (ref 12.0–15.0)
MCH: 28.2 pg (ref 26.0–34.0)
MCHC: 30.6 g/dL (ref 30.0–36.0)
MCV: 92.2 fL (ref 80.0–100.0)
NRBC: 0 % (ref 0.0–0.2)
PLATELETS: 154 10*3/uL (ref 150–400)
RBC: 4.25 MIL/uL (ref 3.87–5.11)
RDW: 19.1 % — AB (ref 11.5–15.5)
WBC: 11.2 10*3/uL — ABNORMAL HIGH (ref 4.0–10.5)

## 2018-06-24 LAB — MAGNESIUM: Magnesium: 2.1 mg/dL (ref 1.7–2.4)

## 2018-06-24 LAB — POCT I-STAT 3, ART BLOOD GAS (G3+)
Acid-base deficit: 3 mmol/L — ABNORMAL HIGH (ref 0.0–2.0)
Bicarbonate: 19.5 mmol/L — ABNORMAL LOW (ref 20.0–28.0)
O2 Saturation: 97 %
Patient temperature: 97.2
TCO2: 20 mmol/L — ABNORMAL LOW (ref 22–32)
pCO2 arterial: 27.4 mmHg — ABNORMAL LOW (ref 32.0–48.0)
pH, Arterial: 7.457 — ABNORMAL HIGH (ref 7.350–7.450)
pO2, Arterial: 85 mmHg (ref 83.0–108.0)

## 2018-06-24 LAB — PHOSPHORUS: Phosphorus: 3.7 mg/dL (ref 2.5–4.6)

## 2018-06-24 LAB — TROPONIN I
TROPONIN I: 2.7 ng/mL — AB (ref <0.03)
Troponin I: 2.26 ng/mL (ref <0.03)

## 2018-06-24 LAB — LACTIC ACID, PLASMA: LACTIC ACID, VENOUS: 3.6 mmol/L — AB (ref 0.5–1.9)

## 2018-06-24 MED ORDER — POTASSIUM CHLORIDE 20 MEQ/15ML (10%) PO SOLN
40.0000 meq | Freq: Once | ORAL | Status: AC
  Administered 2018-06-24: 40 meq
  Filled 2018-06-24: qty 30

## 2018-06-24 MED ORDER — LEVETIRACETAM IN NACL 1000 MG/100ML IV SOLN
1000.0000 mg | Freq: Once | INTRAVENOUS | Status: AC
  Administered 2018-06-24: 1000 mg via INTRAVENOUS
  Filled 2018-06-24: qty 100

## 2018-06-24 MED ORDER — VALPROATE SODIUM 500 MG/5ML IV SOLN
1200.0000 mg | Freq: Once | INTRAVENOUS | Status: AC
  Administered 2018-06-24: 1200 mg via INTRAVENOUS
  Filled 2018-06-24 (×2): qty 12

## 2018-06-24 MED ORDER — FAMOTIDINE IN NACL 20-0.9 MG/50ML-% IV SOLN
20.0000 mg | INTRAVENOUS | Status: DC
  Administered 2018-06-25 – 2018-06-27 (×3): 20 mg via INTRAVENOUS
  Filled 2018-06-24 (×3): qty 50

## 2018-06-24 NOTE — Progress Notes (Signed)
LTM EEG running, no skin break down at time of hook up. 

## 2018-06-24 NOTE — Progress Notes (Signed)
eLink Physician-Brief Progress Note Patient Name: Crystal Duran DOB: [birth year] MRN: [id number]   Date of Service  06/24/2018  HPI/Events of Note  Myoclonic jerks noted on camera with deviation of gaze to left , head CT neg  eICU Interventions  EEG  Load with keppra Versed 1 mg x 1 Neuro input , poo prognosis     Intervention Category Major Interventions: Seizures - evaluation and management  Rakesh V. Alva 06/24/2018, 5:37 AM

## 2018-06-24 NOTE — Progress Notes (Signed)
CRITICAL VALUE ALERT  Critical Value:    Potassium 3.3  Troponin 2.26  Lactic acid 3.6 (trending down)  UOP total 40mL  Date & Time Notied:  06/24/18 0250  Provider Notified: Roxana Hires RN  Orders Received/Actions taken: Awaiting replacement orders at this time.

## 2018-06-24 NOTE — Progress Notes (Signed)
Bedside EEG completed; results pending. 

## 2018-06-24 NOTE — Progress Notes (Signed)
Myoclonic twitching episodes & decorticate posturing of left arm witnessed post turning & providing mouth care to pt. Pt intermittently pacing on cardiac monitor during twitching. Pt sinus brady 50s to SR mid 90s. Cardiology paged regarding ICD activity. Per Cardiology, given no significant rhythm changes, twitching likely not due to ICD firing. No additional orders at this time. Assessment ongoing at this time.

## 2018-06-24 NOTE — Progress Notes (Signed)
Pt eyes open with left gaze & intermittent myoclonic twitching. Respiratory rate increased, PRN fentanyl administered (see MAR). Assessment ongoing.

## 2018-06-24 NOTE — Progress Notes (Signed)
Attempted EEG, pt has hair weave glued down to her scalp. EEG unable to be completed until tech has the ability to apply electrodes on scalp. Notified RN of circumstances. Will attempt EEG again when able and schedule permits.

## 2018-06-24 NOTE — Progress Notes (Signed)
NAME:  Crystal Duran, MRN:  [id number], DOB:  1945-10-25, LOS: 1 ADMISSION DATE:  06/22/2018, CONSULTATION DATE:  06/09/2018 REFERRING MD:  Juleen China CHIEF COMPLAINT:  Cardiac Arrest   Brief History   Crystal Duran is a 72 y.o. 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  She presented to [facility] 10/25 after PEA arrest which then progressed to VF.  She had 5 minutes of downtime before any CPR was started, and then an additional 5 minutes or so prior to ROSC.  In ED, she was found to have a large right sided PTX post CPR for which a small bore chest tube was placed by EDP. 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  Significant Hospital Events   10/25 > admit.  Consults: date of consult/date signed off & final recs:  None.  Procedures (surgical and bedside):  ETT 10/25 > Right small bore chest tube 10/25 >   Significant Diagnostic Tests:  CXR 10/25 >  large right PTX, pulmonary edema. CT head 10/25 > no acute process.  Micro Data:  Blood 10/25 >  Sputum 10/25 >  Urine 10/25 >   Antimicrobials:  None.   Subjective:  06/24/2018 - remains comatose. Overnight myoclonus and seen by neuro - EEG starting. AICD compatible MRI recommended. Rx keppra. RN reports myoclonus recurrence during oral care. Husband at bedside - denied questions. Rn reports - he is flat afect, and stoic and minimally conversing  Objective:  Blood pressure (!) 101/56, pulse 62, temperature 99.3 F (37.4 C), resp. rate 16, height 5\' 4"  (1.626 m), weight 63 kg, SpO2 100 %.    Vent Mode: PRVC FiO2 (%):  [40 %-100 %] 40 % Set Rate:  [16 bmp] 16 bmp Vt Set:  [430 mL] 430 mL PEEP:  [5 cmH20] 5 cmH20 Plateau Pressure:  [14 cmH20-20 cmH20] 16 cmH20   Intake/Output Summary (Last 24 hours) at 06/24/2018 1433 Last data filed at 06/24/2018 1400 Gross per 24 hour  Intake 3260.6 ml  Output 579 ml  Net 2681.6 ml   Filed Weights   06/08/2018 1641 06/24/18 0430  Weight: 60.9  kg 63 kg   General Appearance:  Critically ill looking. On vent Head:  Normocephalic, without obvious abnormality, atraumatic Eyes:  PERRL - yes, conjunctiva/corneas - clear . Corneal reflex     Ears:  Normal external ear canals, both ears Nose:  G tube - no Throat:  ETT TUBE - yew , OG tube - yes Neck:  Supple,  No enlargement/tenderness/nodules Lungs: Clear to auscultation bilaterally, Ventilator   Synchrony - yes. DOEs NOT breathe over vent Heart:  S1 and S2 normal, no murmur, CVP - no.  Pressors - yes levophed Abdomen:  Soft, no masses, no organomegaly Genitalia / Rectal:  Not done Extremities:  Extremities- intact Skin:  ntact in exposed areas . S Neurologic:  Sedation - none -> RASS -4 . Moves all 4s - upppers in response to pain. Gag +, corneal +. Comatose. CAM-ICU - na . Orientation - not      LABS    PULMONARY Recent Labs  Lab 06/21/2018 1801 06/24/18 0349  PHART 7.375 7.457*  PCO2ART 35.6 27.4*  PO2ART 198.0* 85.0  HCO3 21.1 19.5*  TCO2 22 20*  O2SAT 100.0 97.0    CBC Recent Labs  Lab 06/21/18 1646 06/14/2018 1645 06/24/18 0057  HGB 12.4 11.3* 12.0  HCT 39.6 36.2 39.2  WBC 3.9* 4.7 11.2*  PLT  183 162 154    COAGULATION No results for input(s): INR in the last 168 hours.  CARDIAC   Recent Labs  Lab 06/24/18 0052 06/24/18 0719  TROPONINI 2.26* 2.70*   No results for input(s): PROBNP in the last 168 hours.   CHEMISTRY Recent Labs  Lab 06/21/18 1646 05/31/2018 1645 06/24/18 0057  NA 139 141 140  K 3.4*  3.4* 3.3* 3.3*  CL 102 110 106  CO2 22 19* 19*  GLUCOSE 138* 160* 201*  BUN 40* 31* 30*  CREATININE 3.26* 2.95* 2.86*  CALCIUM 8.9 7.6* 8.0*  MG 2.6* 2.1 2.1  PHOS  --   --  3.7   Estimated Creatinine Clearance: 15.1 mL/min (A) (by C-G formula based on SCr of 2.86 mg/dL (H)).   LIVER Recent Labs  Lab 06/17/2018 1645  AST 48*  ALT 33  ALKPHOS 94  BILITOT 0.9  PROT 5.5*  ALBUMIN 2.9*     INFECTIOUS Recent Labs  Lab  06/08/2018 1659 06/21/2018 2005 06/24/18 0052  LATICACIDVEN 4.90* 4.0* 3.6*     ENDOCRINE CBG (last 3)  Recent Labs    06/24/18 0305 06/24/18 0809 06/24/18 1226  GLUCAP 151* 113* 118*         IMAGING x48h  - image(s) personally visualized  -   highlighted in bold Ct Head Wo Contrast  Result Date: 06/02/2018 CLINICAL DATA:  Unexplained altered level of consciousness. Witnessed cardiac arrest. EXAM: CT HEAD WITHOUT CONTRAST TECHNIQUE: Contiguous axial images were obtained from the base of the skull through the vertex without intravenous contrast. COMPARISON:  02/07/2010 FINDINGS: Brain: No evidence of acute infarction, hemorrhage, hydrocephalus, extra-axial collection or mass lesion/mass effect. Anterior and inferior bifrontal encephalomalacia with superficial clips in the midline. Vascular: Atherosclerotic calcification. Skull: Unremarkable bifrontal craniotomy.  No acute finding Sinuses/Orbits: Nasopharyngeal fluid in the setting of intubation. IMPRESSION: No acute finding or change since 2011. Electronically Signed   By: Marnee Spring M.D.   On: 06/22/2018 17:48   Dg Chest Port 1 View  Result Date: 06/24/2018 CLINICAL DATA:  72 year old female admitted status post cardiac arrest, PEA. EXAM: PORTABLE CHEST 1 VIEW COMPARISON:  06/08/2018 and earlier. FINDINGS: Portable AP semi upright view at 0516 hours. Endotracheal tube tip at the level the clavicles. Enteric tube courses to the abdomen, tip not included. Stable left chest cardiac AICD. Stable right pigtail pleural tube. Substantially improved right upper lobe ventilation and regressed opacity. Trace right apical pneumothorax is visible. Streaky left greater than right lower lung opacity has increased. No pleural effusion. Stable cardiomegaly and mediastinal contours. IMPRESSION: 1. Stable lines and tubes. Trace residual right apical pneumothorax. 2. Substantially improved right upper lung ventilation could reflect resolving mucous  plug or aspiration. 3. At the same time there is increased streaky bibasilar opacity suspicious for developing infection. Electronically Signed   By: Odessa Fleming M.D.   On: 06/24/2018 07:51   Dg Chest Portable 1 View  Result Date: 06/02/2018 CLINICAL DATA:  Chest tube placement EXAM: PORTABLE CHEST 1 VIEW COMPARISON:  06/11/2018, 05/18/2018, 05/17/2017 CT FINDINGS: Endotracheal tube tip is about 4.8 cm superior to the carina. Esophageal tube tip is below the diaphragm but not included. Interim placement of right-sided chest tube with curled tip positioned over the right upper lateral chest. Decreased right pneumothorax with trace lateral residual pneumothorax. Left sided pacing device as before. Worsened consolidation in the right upper lobe. Cardiomegaly. Aortic atherosclerosis. IMPRESSION: 1. Interim placement of right-sided chest tube tip positioned over the right upper lateral  chest. Decreased right pneumothorax with trace residual right lateral pneumothorax noted. 2. Interim worsening in right upper lobe consolidation. 3. Cardiomegaly Electronically Signed   By: Jasmine Pang M.D.   On: 06/22/2018 18:16   Dg Chest Port 1 View  Result Date: 06/08/2018 CLINICAL DATA:  Cardiac arrest. EXAM: PORTABLE CHEST 1 VIEW COMPARISON:  05/07/2018 FINDINGS: Cardiac pads overlying the chest. Right pneumothorax measuring 20-30%. Endotracheal tube tip appears to be near the clavicular heads. Patient is mildly rotated towards the left. Patient has a left cardiac ICD. Evidence for interstitial pulmonary edema. Nasogastric tube extends into the abdomen. No obvious or displaced rib fractures. Heart size is difficult to assess due to patient rotation. IMPRESSION: 1. Right pneumothorax measuring 20-30%. 2. Endotracheal tube is appropriately positioned. 3. Pulmonary edema. These results were called by telephone at the time of interpretation on 06/01/2018 at 5:09 pm to Dr. Raeford Razor, who verbally acknowledged these results.  Electronically Signed   By: Richarda Overlie M.D.   On: 06/28/2018 17:11     Assessment & Plan:   #PEA arrest  With post circulatory shock- unclear etiology.  Per discussions with EP, pt has been in and out of VT for quite some time now.  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  - 06/24/18 - circulatory shock +  . On levophed  PLAN - Levophed for MAP > 65 - Continue supportive care but DNR in the event of recurrent arrest. - Continue preadmission ASA,  amiodarone but increase dose and frequency to 400mg  TID after discussion with EP.   Hx HTN, HLD, MI. - Continue preadmission rosuvastatin. - Hold preadission carvedilol, furosemide, entresto.  Right pneumothorax - presumed secondary to CPR.  S/p small bore chest tube placement in ED. - Chest tube to 20cm suction. - Follow CXR.  Acute resp failure 10/26 - does not meet SBT criteria PLAN - PRVC - SBT only if mental status improves (doubtful for recovery given concern for anoxic injury).  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 - Myoclonus - per neuro; on keprra  Hypokalemia. - 40 mEq K per tube.  AoCKD. - trend BMET  Hx DM. - SSI. - Hold preadmission metformin.  Hx hypthoyrodism. - Continue preadmission synthroid,  IV formulation.  Disposition / Summary of Today's Plan 06/24/18   ICU. Continue supportive care but DNR in the event of recurrent arrest.    Diet: NPO. Pain/Anxiety/Delirium protocol (if indicated): Fentanyl PRN / Midazolam PRN. VAP protocol (if indicated): In place. DVT prophylaxis: SCD's / Heparin. GI prophylaxis: Famotidine. Hyperglycemia protocol: SSI. Mobility: Bedrest. Code Status: DNR. Family Communication:   DNR. On 06/24/2018 - MD tried to approach husband ubut he was too stoic to discuss. [place] called   ATTESTATION & SIGNATURE   The patient Crystal Duran is critically ill with multiple organ systems failure and requires high complexity  decision making for assessment and support, frequent evaluation and titration of therapies, application of advanced monitoring technologies and extensive interpretation of multiple databases.   Critical Care Time devoted to patient care services described in this note is  30  Minutes. This time reflects time of care of this signee Dr Kalman Shan. This critical care time does not reflect procedure time, or teaching time or supervisory time of PA/NP/Med student/Med Resident etc but could involve care discussion time     Dr. Kalman Shan, M.D., [facility].C.P Pulmonary and Critical Care Medicine Staff Physician [facility]  Pager: [phone], If no answer or between  15:00h - 7:00h: call [phone]  06/24/2018 2:54 PM

## 2018-06-24 NOTE — Progress Notes (Signed)
Radiology at bedside for x-ray. Pt began myclonic twitching. HR 110s, O2 desat mid 70s on cardiac monitor. Oxygenated at 100% on vent during this time. Elink notified; Dr. Vassie Loll on camera in room confirming myclonus. Order to administer PRN versed at this time as well as EEG today. Pt back in sinus rhythm, spO2 100%, no further myoclonus.  Assessment ongoing.

## 2018-06-24 NOTE — Consult Note (Signed)
Neurology Consultation  Reason for Consult: concern for myoclonus, post cardiac arrest prognostication Referring Physician: Dr. Elsworth Soho  CC: Myoclonic jerking  History is obtained from: Chart, patient's RN  HPI: Crystal Duran is a 72 y.o. female significant past medical history of congestive heart failure EF 10% status post AICD placement, hypertension, hypercholesterolemia, hypothyroidism, history of coronary artery disease, diabetes, admitted to the neuro ICU after having suffered PEA cardiac arrest followed by V. fib cardiac arrest with 15 ICD shocks with initial episode at 1540 hrs. on 06/06/2018 and last episode at 1550 hrs. on 06/03/2018, currently intubated on no sedation in the ICU, was noted to have presumed myoclonic looking activity as well as posturing of the left arm.  She was given 1 mg of Versed up on top of PRN fentanyl pushes and Keppra 1 g load IV was ordered. I spoke with the patient's nurse in detail who said that she was having this intermittent jerking movement mostly in her core with no clear limb shaking or jerking that initially lasted a few seconds but the last episode lasted a few minutes prompting call to the on-call critical care doctor who had ordered Versed and Keppra load.  Majority of the time the myoclonic kind activity was noted while attempting to do patient care concerning for status induced myoclonus.  The team also contacted cardiology to ensure that there was no ICD firing that might be presenting as these jerking movements, but there seems to be no concern regarding ICD firing at this time. Neurological consultation was obtained for recommendations for these myoclonic-like jerks as well as prognostication. Patient is not being cooled and is a DNR.  Total downtime from cardiac arrest was probably 10 minutes.  Post CPR chest x-ray with large right-sided pneumothorax for which a chest tube was placed by the ED provider.  LKW: Presumably somewhere around 3 PM on  05/30/2018 tpa given?: no, outside the window Premorbid modified Rankin scale (mRS): Unable to obtain-no family at bedside.  ROS: Unable to obtain due to altered mental status.   Past Medical History:  Diagnosis Date  . AICD (automatic cardioverter/defibrillator) present    [employer] Fortify ICD 731 [id number]  . Arthritis    "hands, back" (04/07/2018)  . CAP (community acquired pneumonia) 04/07/2018  . 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 CHF (congestive heart failure) (HCC)    systolic  . Chronic lower back pain   . CKD (chronic kidney disease), stage III ([place])   . History of tobacco abuse   . HTN (hypertension)   . Hypercholesteremia   . Hypothyroidism   . Ischemic cardiomyopathy    severe  . MI (myocardial infarction) ([place]) 02/2009   Hx of inferior wall MI   . On home oxygen therapy    "2-4L prn" (04/07/2018)  . Pneumonia    "once before today" (04/07/2018)  . Type 2 diabetes mellitus (HCC)    Family History  Problem Relation Age of Onset  . Coronary artery disease Other        postive family Hx   Social History:   reports that she quit smoking about 24 years ago. Her smoking use included cigarettes. She has a 2.50 pack-year smoking history. She has never used smokeless tobacco. She reports that she drank alcohol. She reports that she does not use drugs.  Medications  Current Facility-Administered Medications:  .  0.9 %  sodium chloride infusion, 250 mL, Intravenous, Continuous, Desai, Rahul P, PA-C, Stopped at 06/20/2018  2232 .  albuterol (PROVENTIL) (2.5 MG/3ML) 0.083% nebulizer solution 2.5 mg, 2.5 mg, Nebulization, Q3H PRN, Desai, Rahul P, PA-C .  amiodarone (PACERONE) tablet 400 mg, 400 mg, Per Tube, TID, Desai, Rahul P, PA-C, 400 mg at 06/03/2018 2153 .  aspirin chewable tablet 81 mg, 81 mg, Per Tube, Daily, Shearon Stalls, Rahul P, PA-C, 81 mg at 06/12/2018 2153 .  chlorhexidine gluconate (MEDLINE KIT) (PERIDEX) 0.12 % solution 15 mL, 15 mL, Mouth Rinse, BID,  Virgel Manifold, MD, 15 mL at 06/06/2018 2000 .  famotidine (PEPCID) IVPB 20 mg premix, 20 mg, Intravenous, Q12H, Desai, Rahul P, PA-C, Stopped at 06/17/2018 2222 .  fentaNYL (SUBLIMAZE) injection 50 mcg, 50 mcg, Intravenous, Q2H PRN, Shearon Stalls, Rahul P, PA-C, 50 mcg at 06/24/18 0529 .  heparin injection 5,000 Units, 5,000 Units, Subcutaneous, Q8H, Desai, Rahul P, PA-C, 5,000 Units at 06/22/2018 2153 .  insulin aspart (novoLOG) injection 0-9 Units, 0-9 Units, Subcutaneous, Q4H, Desai, Rahul P, PA-C, 2 Units at 06/24/18 0403 .  levETIRAcetam (KEPPRA) IVPB 1000 mg/100 mL premix, 1,000 mg, Intravenous, Once, Rigoberto Noel, MD .  levothyroxine (SYNTHROID, LEVOTHROID) injection 87.5 mcg, 87.5 mcg, Intravenous, Daily, Shearon Stalls, Rahul P, PA-C, 87.5 mcg at 06/18/2018 2154 .  MEDLINE mouth rinse, 15 mL, Mouth Rinse, 10 times per day, Virgel Manifold, MD, 15 mL at 06/24/18 0402 .  midazolam (VERSED) injection 1 mg, 1 mg, Intravenous, Q15 min PRN, Desai, Rahul P, PA-C, 1 mg at 06/24/18 0539 .  midazolam (VERSED) injection 1 mg, 1 mg, Intravenous, Q2H PRN, Desai, Rahul P, PA-C .  norepinephrine (LEVOPHED) 31m in D5W 257mpremix infusion, 2-10 mcg/min, Intravenous, Titrated, Desai, Rahul P, PA-C, Stopped at 06/24/18 0350 .  rosuvastatin (CRESTOR) tablet 10 mg, 10 mg, Per Tube, q1800, Desai, Rahul P, PA-C, 10 mg at 06/03/2018 2201 Exam: Current vital signs: BP (!) 98/58   Pulse (!) 59   Temp 97.9 F (36.6 C)   Resp 16   Ht 5' 4" (1.626 m)   Wt 63 kg   SpO2 100%   BMI 23.84 kg/m  Vital signs in last 24 hours: Temp:  [91.2 F (32.9 C)-97.9 F (36.6 C)] 97.9 F (36.6 C) (10/26 0515) Pulse Rate:  [46-107] 59 (10/26 0515) Resp:  [13-39] 16 (10/26 0515) BP: (70-142)/(49-94) 98/58 (10/26 0515) SpO2:  [91 %-100 %] 100 % (10/26 0515) FiO2 (%):  [50 %-100 %] 50 % (10/26 0338) Weight:  [60.9 kg-63 kg] 63 kg (10/26 0430) GENERAL: intubated, on no sedation. HEENT: Normocephalic, atraumatic, no thyromegaly palpated Lungs:  Clear to auscultation, intubated Cardiovascular: Regular rate rhythm which is at times paced on the monitor Abdomen: Soft nondistended nontender Extremities: Warm well perfused Neurological exam Mental status: Intubated, on no sedation drips but was given 1 mg of Ativan few minutes prior to this encounter.  Also has been given fentanyl pushes as needed. Language: Unable to assess speech due to intubation.  She is not following any commands but would attempt to open her eyes to sternal rub. Cranial nerves: Her pupils are pinpoint but still sluggishly reactive, corneal reflexes are present bilaterally, gaze is slightly disconjugate with left exotropia, doll's eye reflex present, facial symmetry difficult to ascertain. Motor exam: No spontaneous movement noted and no jerking movements noted during this exam.  On noxious stimulation-sternal rub, she attempts to localize with both upper extremities.  On noxious stimulation to lower extremities, there is minimal withdrawal bilaterally. Sensory exam: As above Coordination cannot be assessed Gait exam cannot be assessed at this  time.  Labs I have reviewed labs in epic and the results pertinent to this consultation are: Mild leukocytosis WBC count 11.2, potassium 3.3, sodium 140, creatinine 2.86, BUN 30, glucose 201, GFR 18.  CBC    Component Value Date/Time   WBC 11.2 (H) 06/24/2018 0057   RBC 4.25 06/24/2018 0057   HGB 12.0 06/24/2018 0057   HCT 39.2 06/24/2018 0057   PLT 154 06/24/2018 0057   MCV 92.2 06/24/2018 0057   MCH 28.2 06/24/2018 0057   MCHC 30.6 06/24/2018 0057   RDW 19.1 (H) 06/24/2018 0057   LYMPHSABS 1.5 06/25/2018 1645   MONOABS 0.4 06/08/2018 1645   EOSABS 0.1 06/11/2018 1645   BASOSABS 0.1 06/20/2018 1645    CMP     Component Value Date/Time   NA 140 06/24/2018 0057   NA 143 06/09/2018 1314   K 3.3 (L) 06/24/2018 0057   CL 106 06/24/2018 0057   CO2 19 (L) 06/24/2018 0057   GLUCOSE 201 (H) 06/24/2018 0057   BUN 30  (H) 06/24/2018 0057   BUN 19 06/09/2018 1314   CREATININE 2.86 (H) 06/24/2018 0057   CALCIUM 8.0 (L) 06/24/2018 0057   PROT 5.5 (L) 06/14/2018 1645   ALBUMIN 2.9 (L) 06/07/2018 1645   AST 48 (H) 06/19/2018 1645   ALT 33 06/22/2018 1645   ALKPHOS 94 06/15/2018 1645   BILITOT 0.9 06/26/2018 1645   GFRNONAA 15 (L) 06/24/2018 0057   GFRAA 18 (L) 06/24/2018 0057   Imaging I have reviewed the images obtained: CT-scan of the brain-no acute changes on noncontrast CT of the head that was obtained on this admission.  MRI examination of the brain  Assessment:  72 year old woman with advanced systolic cardiomyopathy with EF of 10% status post PEA cardiac arrest followed by V. fib and a GCS 3 on presentation, presumably also exhibiting some myoclonic jerking most of which per nursing report was stimulus induced. Given her comorbidities and cardiac arrest, there is concern for hypoxic/anoxic brain injury.  Her examination though, shows presence of all brainstem reflexes as well as attempts to open the eyes and noxious stimulation in attempts to localization in upper extremities, which is not necessarily poor examination but presence of myoclonus, if real, so early in the course of post cardiac arrest is usually a bad prognosticator but I am not convinced that she was having true myoclonic jerking. Noncontrast CT of the head also has been unremarkable has not showed any changes consistent with hypoxic/ischemic brain damage, but this was done soon after the event and delayed imaging might provide some meaningful input.  MRI probably cannot be done due to the AICD but I would request the primary team to check the compatibility of the AICD with MRI. Seizures also remain in the differential due to the hypoxic/ischemic insult to the brain.  Impression: Evaluate for hypoxic/ischemic brain injury Evaluate for underlying seizures  Recommendations: Continue supportive care per primary team as you are. Check  if AICD is MRI compatible-MRI brain would be helpful in assessing the extent of anoxic damage Agree with the load of Keppra if the movements were truly myoclonic jerking.  If the continue to occur in spite of Keppra, consider Depakote.  The next step should be to try Klonopin. Obtain spot EEG.  If concern for underlying status or seizure-like activity, can continue long-term video EEG. Consider repeating CT head 24 to 36-hour hours after the initial noncontrast CT of the head if MRI is not possible. As said above, it is  too early to prognosticate- EEG and further clinical exams along with repeat brain imaging in the next 24 to 48 hours would provide more insight to the prognosis.  -- Amie Portland, MD [facility] Pager: [phone] If 7pm to 7am, please call on call as listed on AMION.  CRITICAL CARE ATTESTATION This patient is critically ill and at significant risk of neurological worsening, death and care requires constant monitoring of vital signs, hemodynamics, respiratory, and cardiac monitoring. I spent 30  minutes of neurocritical care time performing neurological assessment, discussion with family, other specialists and medical decision making of high complexity in the care of  this patient.

## 2018-06-24 NOTE — Progress Notes (Signed)
Patient having myoclonic jerks, however pupils are reactive, dolls eye present and gag reflex present.   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 Shows generalized periodic discharges with burst suppression pattern without any EEG reactivity carries poor prognosis   Impression:  Will Valproic acid 20mg /kg and start on maintenance therapy for myoclonus Will connect LTM to see if EEG pattern progressive to seizures/status epilepticus Will continue to follow.

## 2018-06-24 NOTE — Progress Notes (Signed)
eLink Physician-Brief Progress Note Patient Name: Crystal Duran DOB: [birth year] MRN: [id number]   Date of Service  06/24/2018  HPI/Events of Note    eICU Interventions  Hypokalemia -repleted      Intervention Category Intermediate Interventions: Electrolyte abnormality - evaluation and management  Rakesh V. Alva 06/24/2018, 2:58 AM

## 2018-06-24 NOTE — Progress Notes (Signed)
Vent alarming for high regulatory pressure. Pt spontaneously opened eyes, gaze upward and to the left, two episodes of full body twitching witnessed. Called E-Link, Dr. Vassie Loll notified; order to administer PRN fentanyl to maintain RASS at this time. Assessment ongoing.

## 2018-06-24 NOTE — Progress Notes (Signed)
   72 y/o woman with advanced ischemic CM followed by Dr. Sharyn Lull admitted with VF arrest and probable anoxic brain injury.   Prognosis appears grim.   As patient followed by Dr. Sharyn Lull as outpatient.  [facility] cardiology will sign off.   Please contact Dr. Sharyn Lull if further cardiac recs needed.   Arvilla Meres, MD  11:21 AM

## 2018-06-24 NOTE — Procedures (Signed)
Date of recording10/26/2019  Referring physician Cyril Mourning  Reason for the study Unresponsiveness/intubated not on any sedation  Technical Digital EEG recording using 10-20 international electrode system  Description of the recording No significant posterior dominant rhythm seen EEG comprises mixture of Frequent generalized periodic epileptiform discharges intermixed with generalized delta activity with intermittent burst suppression pattern No EEG reactivity. At times generalized periodic epileptiform discharges correlates with the patient twitching consistent with myoclonus.  Impression The EEG isabnormal and findings are consistent with severe generalized and epileptiform cerebral dysfunction. In a patient with anoxic brain injury, pattern of generalized periodic discharges with burst suppression pattern without any EEG reactivity carries poor prognosis.  Clinical correlation is advised. Electrographic seizure pattern was not seen.

## 2018-06-24 NOTE — Progress Notes (Signed)
Initial Nutrition Assessment  DOCUMENTATION CODES:  Not applicable  INTERVENTION:  If enteral nutrition becomes indicated. Appropriate recommendations are as follows:  Vital High Protein at goal rate of 55 ml/h (1320 ml per day) to provide 1320 kcals, 115.5 gm protein, 1104 ml free water daily.  NUTRITION DIAGNOSIS:  Inadequate oral intake related to inability to eat as evidenced by NPO status.  GOAL:  Patient will meet greater than or equal to 90% of their needs  MONITOR:  Vent status, Labs, I & O's, GOC  REASON FOR ASSESSMENT:  Ventilator    ASSESSMENT:  72 y/o female PMHx HF (EF 10%) s/p aicd, MI, hld/htn, ckd, cad, dm2. Suffered PEA arrest which progressed to VF. 5 minutes downtime prior to initiation of CPR and another 5 until ROSC. In ED, found R ptx s/p chest tube. Intubated on arrival to ED.   Pt intubated. Spouse at bedside.   He denies patient having any changes in appetite or weight recently. He says her ubw was about 135 lbs. Per chart, patients weight appears to have been stable between 130-140 for the past several months.   Pt with poor prognosis. Will leave TF recommendations in event nutrition becomes indicated.   Patient is currently intubated on ventilator support MV:7.7 L/min Temp (24hrs), Avg:97.2 F (36.2 C), Min:91.2 F (32.9 C), Max:98.8 F (37.1 C) Propofol: None  Labs: BG: 113-151, K: 3.3, Glu: 160-201. Bun/creat: 30/2.86. Phos/mag: wdl, Albumin: 2.9, LA: 4.9 on admit Meds: Insulin, keppra, h2ra, IVF Pressor/inotrope support: Levophed Sedation: Versed/Fentanyl PRN  Recent Labs  Lab 06/21/18 1646 06/07/2018 1645 06/24/18 0057  NA 139 141 140  K 3.4*  3.4* 3.3* 3.3*  CL 102 110 106  CO2 22 19* 19*  BUN 40* 31* 30*  CREATININE 3.26* 2.95* 2.86*  CALCIUM 8.9 7.6* 8.0*  MG 2.6* 2.1 2.1  PHOS  --   --  3.7  GLUCOSE 138* 160* 201*   NUTRITION - FOCUSED PHYSICAL EXAM: Deferred-nursing performing pt care  Diet Order:   Diet Order        Diet NPO time specified  Diet effective now             EDUCATION NEEDS:  No education needs have been identified at this time  Skin:  Skin Assessment: Reviewed RN Assessment  Last BM:  10/25  Height:  Ht Readings from Last 1 Encounters:  06/21/2018 5\' 4"  (1.626 m)   Weight:  Wt Readings from Last 1 Encounters:  06/24/18 63 kg   Wt Readings from Last 10 Encounters:  06/24/18 63 kg  06/09/18 61 kg  05/21/18 64.8 kg  04/11/18 66.1 kg  07/26/17 62.6 kg  07/15/17 63.8 kg  06/29/16 70.1 kg  05/12/2012 81.1 kg  03/23/2011 83.5 kg   Ideal Body Weight:  54.54 kg  BMI:  Body mass index is 23.84 kg/m.  Estimated Nutritional Needs:  Kcal:  1300 kcals (psu 2003b) Protein:  100-115g Pro (1.6-1.8g/kg bw) Fluid:  Per MD goals  Christophe Louis RD, LDN, CNSC Clinical Nutrition Available Tues-Sat via Pager: [phone] 06/24/2018 12:35 PM

## 2018-06-25 ENCOUNTER — Inpatient Hospital Stay (HOSPITAL_COMMUNITY): Payer: Medicare HMO

## 2018-06-25 DIAGNOSIS — G931 Anoxic brain damage, not elsewhere classified: Secondary | ICD-10-CM

## 2018-06-25 LAB — URINE CULTURE: Culture: NO GROWTH

## 2018-06-25 LAB — BASIC METABOLIC PANEL
ANION GAP: 13 (ref 5–15)
BUN: 34 mg/dL — ABNORMAL HIGH (ref 8–23)
CHLORIDE: 109 mmol/L (ref 98–111)
CO2: 17 mmol/L — ABNORMAL LOW (ref 22–32)
Calcium: 8 mg/dL — ABNORMAL LOW (ref 8.9–10.3)
Creatinine, Ser: 3.41 mg/dL — ABNORMAL HIGH (ref 0.44–1.00)
GFR calc Af Amer: 14 mL/min — ABNORMAL LOW (ref >60)
GFR, EST NON AFRICAN AMERICAN: 12 mL/min — AB (ref >60)
Glucose, Bld: 106 mg/dL — ABNORMAL HIGH (ref 70–99)
POTASSIUM: 5.7 mmol/L — AB (ref 3.5–5.1)
SODIUM: 139 mmol/L (ref 135–145)

## 2018-06-25 LAB — CBC WITH DIFFERENTIAL/PLATELET
Abs Immature Granulocytes: 0.05 10*3/uL (ref 0.00–0.07)
BASOS ABS: 0 10*3/uL (ref 0.0–0.1)
BASOS PCT: 0 %
EOS ABS: 0 10*3/uL (ref 0.0–0.5)
Eosinophils Relative: 0 %
HCT: 38.1 % (ref 36.0–46.0)
Hemoglobin: 12 g/dL (ref 12.0–15.0)
IMMATURE GRANULOCYTES: 1 %
Lymphocytes Relative: 9 %
Lymphs Abs: 0.9 10*3/uL (ref 0.7–4.0)
MCH: 28.4 pg (ref 26.0–34.0)
MCHC: 31.5 g/dL (ref 30.0–36.0)
MCV: 90.1 fL (ref 80.0–100.0)
Monocytes Absolute: 0.6 10*3/uL (ref 0.1–1.0)
Monocytes Relative: 5 %
NEUTROS PCT: 85 %
Neutro Abs: 8.7 10*3/uL — ABNORMAL HIGH (ref 1.7–7.7)
PLATELETS: 141 10*3/uL — AB (ref 150–400)
RBC: 4.23 MIL/uL (ref 3.87–5.11)
RDW: 19.2 % — AB (ref 11.5–15.5)
WBC: 10.2 10*3/uL (ref 4.0–10.5)
nRBC: 0 % (ref 0.0–0.2)

## 2018-06-25 LAB — MAGNESIUM
MAGNESIUM: 2.2 mg/dL (ref 1.7–2.4)
MAGNESIUM: 2.3 mg/dL (ref 1.7–2.4)

## 2018-06-25 LAB — GLUCOSE, CAPILLARY
Glucose-Capillary: 101 mg/dL — ABNORMAL HIGH (ref 70–99)
Glucose-Capillary: 108 mg/dL — ABNORMAL HIGH (ref 70–99)
Glucose-Capillary: 128 mg/dL — ABNORMAL HIGH (ref 70–99)
Glucose-Capillary: 136 mg/dL — ABNORMAL HIGH (ref 70–99)
Glucose-Capillary: 69 mg/dL — ABNORMAL LOW (ref 70–99)
Glucose-Capillary: 94 mg/dL (ref 70–99)
Glucose-Capillary: 96 mg/dL (ref 70–99)

## 2018-06-25 LAB — PHOSPHORUS: Phosphorus: 3.9 mg/dL (ref 2.5–4.6)

## 2018-06-25 LAB — TRIGLYCERIDES: Triglycerides: 59 mg/dL (ref <150)

## 2018-06-25 LAB — POTASSIUM: POTASSIUM: 6.1 mmol/L — AB (ref 3.5–5.1)

## 2018-06-25 MED ORDER — VALPROATE SODIUM 500 MG/5ML IV SOLN
500.0000 mg | Freq: Three times a day (TID) | INTRAVENOUS | Status: DC
  Administered 2018-06-25 – 2018-06-27 (×7): 500 mg via INTRAVENOUS
  Filled 2018-06-25 (×9): qty 5

## 2018-06-25 MED ORDER — LEVETIRACETAM IN NACL 500 MG/100ML IV SOLN
500.0000 mg | Freq: Two times a day (BID) | INTRAVENOUS | Status: DC
  Administered 2018-06-25 – 2018-06-27 (×5): 500 mg via INTRAVENOUS
  Filled 2018-06-25 (×6): qty 100

## 2018-06-25 MED ORDER — PROPOFOL 1000 MG/100ML IV EMUL
5.0000 ug/kg/min | INTRAVENOUS | Status: DC
  Administered 2018-06-25: 5 ug/kg/min via INTRAVENOUS
  Administered 2018-06-26: 10 ug/kg/min via INTRAVENOUS
  Administered 2018-06-26: 5 ug/kg/min via INTRAVENOUS
  Filled 2018-06-25 (×4): qty 100

## 2018-06-25 MED ORDER — SODIUM ZIRCONIUM CYCLOSILICATE 10 G PO PACK
10.0000 g | PACK | Freq: Once | ORAL | Status: AC
  Administered 2018-06-25: 10 g via ORAL
  Filled 2018-06-25: qty 1

## 2018-06-25 MED ORDER — PRO-STAT SUGAR FREE PO LIQD
30.0000 mL | Freq: Two times a day (BID) | ORAL | Status: DC
  Administered 2018-06-25 – 2018-06-26 (×3): 30 mL
  Filled 2018-06-25 (×3): qty 30

## 2018-06-25 MED ORDER — SODIUM POLYSTYRENE SULFONATE 15 GM/60ML PO SUSP
30.0000 g | Freq: Once | ORAL | Status: AC
  Administered 2018-06-25: 30 g
  Filled 2018-06-25: qty 120

## 2018-06-25 MED ORDER — CLONAZEPAM 0.5 MG PO TABS
1.0000 mg | ORAL_TABLET | Freq: Three times a day (TID) | ORAL | Status: DC | PRN
  Administered 2018-06-25: 1 mg
  Filled 2018-06-25: qty 2

## 2018-06-25 MED ORDER — DEXTROSE 50 % IV SOLN
INTRAVENOUS | Status: AC
  Administered 2018-06-25: 50 mL
  Filled 2018-06-25: qty 50

## 2018-06-25 MED ORDER — VITAL HIGH PROTEIN PO LIQD
1000.0000 mL | ORAL | Status: DC
  Administered 2018-06-25 – 2018-06-26 (×4): 1000 mL

## 2018-06-25 MED ORDER — ROCURONIUM BROMIDE 50 MG/5ML IV SOLN
50.0000 mg | Freq: Once | INTRAVENOUS | Status: AC
  Administered 2018-06-25: 50 mg via INTRAVENOUS

## 2018-06-25 NOTE — Progress Notes (Signed)
Patient's bite block had worked it's way in a little further into the patient's mouth behind the patient's teeth. Dr. Marchelle Gearing was notified of this and he removed the bite block with the use of the Glidescope. A bite block was not replaced as the patient is currently on propofol at this time.

## 2018-06-25 NOTE — Progress Notes (Addendum)
NAME:  Crystal Duran, MRN:  [id number], DOB:  06/17/1946, LOS: 2 ADMISSION DATE:  06/26/2018, CONSULTATION DATE:  06/22/2018 REFERRING MD:  Juleen China CHIEF COMPLAINT:  Cardiac Arrest   Brief History   Crystal Duran is a 72 y.o. 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  She presented to [facility] 10/25 after PEA arrest which then progressed to VF.  She had 5 minutes of downtime before any CPR was started, and then an additional 5 minutes or so prior to ROSC.  In ED, she was found to have a large right sided PTX post CPR for which a small bore chest tube was placed by EDP. 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  Significant Hospital Events   10/25 > admit. 10/26 = remains comatose. Overnight myoclonus and seen by neuro - EEG starting. AICD compatible MRI recommended. Rx keppra. RN reports myoclonus recurrence during oral care. Husband at bedside - denied questions. Rn reports - he is flat afect, and stoic and minimally conversing  Consults: date of consult/date signed off & final recs:  None.  Procedures (surgical and bedside):  ETT 10/25 > Right small bore chest tube 10/25 >   Significant Diagnostic Tests:  CXR 10/25 >  large right PTX, pulmonary edema. CT head 10/25 > no acute process.  Micro Data:  Blood 10/25 >  Sputum 10/25 >  Urine 10/25 >   Antimicrobials:  None.   Subjective:  06/25/2018 - myoclonus needing diprivan. c-EEG ongoing. Bite block lodged in throat - needed roc and glidescope to dislodge. Stoic husband at bedside - did not want to engage in conversation. Did express to him unilaterally that comfort care is best. He shook his head unwilling to hear more. Requested he contemplate what is in her best interest. He said he will  K now at 5.7 and rising creat  Objective:  Blood pressure 119/73, pulse 81, temperature 99 F (37.2 C), resp. rate 18, height 5\' 4"  (1.626 m), weight 65 kg, SpO2 100 %.    Vent Mode:  PRVC FiO2 (%):  [40 %] 40 % Set Rate:  [16 bmp] 16 bmp Vt Set:  [430 mL] 430 mL PEEP:  [5 cmH20] 5 cmH20 Plateau Pressure:  [17 cmH20-20 cmH20] 20 cmH20   Intake/Output Summary (Last 24 hours) at 06/25/2018 1141 Last data filed at 06/25/2018 1100 Gross per 24 hour  Intake 719.94 ml  Output 323 ml  Net 396.94 ml   Filed Weights   06/21/2018 1641 06/24/18 0430 06/25/18 0500  Weight: 60.9 kg 63 kg 65 kg  General Appearance:  Looks criticall ill Head:  Normocephalic, without obvious abnormality, atraumatic c-EEG wires + Eyes:  , conjunctiva/corneas - muddy     Ears:  Normal external ear canals, both ears Nose:  G tube - no Throat:  ETT TUBE - yes , OG tube - yes Neck:  Supple,  No enlargement/tenderness/nodules Lungs: Clear to auscultation bilaterally, Ventilator   Synchrony - yes. Right chest tube + Heart:  S1 and S2 normal, no murmur, CVP - no.  Pressors - no Abdomen:  Soft, no masses, no organomegaly Genitalia / Rectal:  Not done Extremities:  Extremities- intact Skin:  ntact in exposed areas .  Neurologic:  Sedation - diprivan gtt -> RASS - -5      Assessment & Plan:   #PEA arrest  With post circulatory shock- unclear etiology.  Per discussions with EP, pt has been in  and out of VT for quite some time now.  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  - 10/27 -> off levophed  PLAN -MAP > 55 and sbp > 95 - Continue supportive care but DNR in the event of recurrent arrest. - Continue preadmission ASA,  amiodarone but increased dose and frequency to 400mg  TID after discussion with EP.   Hx HTN, HLD, MI. - Continue preadmission rosuvastatin. - Hold preadission carvedilol, furosemide, entresto.  Right pneumothorax - presumed secondary to CPR.  S/p small bore chest tube placement in ED.  - no change - Chest tube to 20cm suction. - Follow CXR.  Acute resp failure - does not meet SBT criteria PLAN - PRVC - SBT  only if mental status improves (doubtful for recovery given concern for anoxic injury).  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 - Myoclonus - per neuro; on keprra - c-EEG ongoing    AKI with CKD  - worsening hyperkalemia and creat. - not a CRRT candidate - Lokelma x 1 - monitorlabs  Hx DM. - SSI. - Hold preadmission metformin.  Hx hypthoyrodism. - Continue preadmission synthroid,  IV formulation.  GI  - star TF  Disposition / Summary of Today's Plan 06/25/18   ICU. Continue supportive care but DNR in the event of recurrent arrest.     Diet: NPO. Pain/Anxiety/Delirium protocol (if indicated): Fentanyl PRN / Midazolam PRN. VAP protocol (if indicated): In place. DVT prophylaxis: SCD's / Heparin. GI prophylaxis: Famotidine. Hyperglycemia protocol: SSI. Mobility: Bedrest. Code Status: DNR. Family Communication:   DNR. On 06/24/2018 - MD tried to approach husband ubut he was too stoic to discuss and same with chaplain. On 06/25/18 -he admitted grief and did not want to converse about terminal wean/comfort care.     ATTESTATION & SIGNATURE   The patient Crystal Duran is critically ill with multiple organ systems failure and requires high complexity decision making for assessment and support, frequent evaluation and titration of therapies, application of advanced monitoring technologies and extensive interpretation of multiple databases.   Critical Care Time devoted to patient care services described in this note is  30  Minutes. This time reflects time of care of this signee Dr Kalman Shan. This critical care time does not reflect procedure time, or teaching time or supervisory time of PA/NP/Med student/Med Resident etc but could involve care discussion time     Dr. Kalman Shan, M.D., [facility].C.P Pulmonary and Critical Care Medicine Staff Physician [facility] [place] [facility] Pager: [phone], If no answer or between  15:00h - 7:00h: call [phone]  06/25/2018 11:41 AM    LABS/    PULMONARY Recent Labs  Lab 06/04/2018 1801 06/24/18 0349  PHART 7.375 7.457*  PCO2ART 35.6 27.4*  PO2ART 198.0* 85.0  HCO3 21.1 19.5*  TCO2 22 20*  O2SAT 100.0 97.0    CBC Recent Labs  Lab 06/29/2018 1645 06/24/18 0057 06/25/18 0811  HGB 11.3* 12.0 12.0  HCT 36.2 39.2 38.1  WBC 4.7 11.2* 10.2  PLT 162 154 141*    COAGULATION No results for input(s): INR in the last 168 hours.  CARDIAC   Recent Labs  Lab 06/24/18 0052 06/24/18 0719  TROPONINI 2.26* 2.70*   No results for input(s): PROBNP in the last 168 hours.   CHEMISTRY Recent Labs  Lab 06/21/18 1646 06/02/2018 1645 06/24/18 0057 06/24/18 2335 06/25/18 0811  NA 139 141 140 139  --  K 3.4*  3.4* 3.3* 3.3* 5.7*  --   CL 102 110 106 109  --   CO2 22 19* 19* 17*  --   GLUCOSE 138* 160* 201* 106*  --   BUN 40* 31* 30* 34*  --   CREATININE 3.26* 2.95* 2.86* 3.41*  --   CALCIUM 8.9 7.6* 8.0* 8.0*  --   MG 2.6* 2.1 2.1  --  2.2  PHOS  --   --  3.7  --  3.9   Estimated Creatinine Clearance: 12.7 mL/min (A) (by C-G formula based on SCr of 3.41 mg/dL (H)).   LIVER Recent Labs  Lab 06/07/2018 1645  AST 48*  ALT 33  ALKPHOS 94  BILITOT 0.9  PROT 5.5*  ALBUMIN 2.9*     INFECTIOUS Recent Labs  Lab 06/28/2018 1659 06/29/2018 2005 06/24/18 0052  LATICACIDVEN 4.90* 4.0* 3.6*     ENDOCRINE CBG (last 3)  Recent Labs    06/24/18 2313 06/25/18 0338 06/25/18 0809  GLUCAP 119* 94 101*         IMAGING x48h  - image(s) personally visualized  -   highlighted in bold Ct Head Wo Contrast  Result Date: 06/09/2018 CLINICAL DATA:  Unexplained altered level of consciousness. Witnessed cardiac arrest. EXAM: CT HEAD WITHOUT CONTRAST TECHNIQUE: Contiguous axial images were obtained from the base of the skull through the vertex without intravenous contrast. COMPARISON:  02/07/2010 FINDINGS: Brain: No evidence of acute infarction, hemorrhage, hydrocephalus,  extra-axial collection or mass lesion/mass effect. Anterior and inferior bifrontal encephalomalacia with superficial clips in the midline. Vascular: Atherosclerotic calcification. Skull: Unremarkable bifrontal craniotomy.  No acute finding Sinuses/Orbits: Nasopharyngeal fluid in the setting of intubation. IMPRESSION: No acute finding or change since 2011. Electronically Signed   By: Marnee Spring M.D.   On: 06/05/2018 17:48   Dg Chest Port 1 View  Result Date: 06/25/2018 CLINICAL DATA:  ETT check EXAM: PORTABLE CHEST 1 VIEW COMPARISON:  06/24/2018 FINDINGS: Endotracheal tube terminates 5.5 cm above the carina. Lungs are essentially clear.  No pleural effusion or pneumothorax. Right pigtail chest tube. The heart is top-normal in size.  Left subclavian ICD. Enteric tube courses into the stomach. IMPRESSION: Endotracheal tube terminates 5.5 cm above the carina. Right pigtail chest tube.  No pneumothorax is seen. Additional support apparatus as above. Electronically Signed   By: Charline Bills M.D.   On: 06/25/2018 10:23   Dg Chest Port 1 View  Result Date: 06/24/2018 CLINICAL DATA:  Nurse was worried that chest tube had been pulled on. Checking placement. EXAM: PORTABLE CHEST 1 VIEW COMPARISON:  06/24/2018 at 5:16 a.m. FINDINGS: Chest tube projects along the lateral right pleural space, adjacent to the inner margin of the lateral right fourth rib, without change from the earlier exam. Endotracheal tube and nasal/orogastric tube are stable. Heart is enlarged. Lungs show prominent bronchovascular markings, stable. Tiny right apical pneumothorax noted on the earlier exam is likely unchanged. The superior margin of the right apex was excluded from the current field of view. IMPRESSION: 1. No change in the position of the right-sided chest tube. No radiographic change when compared to the study obtained earlier today. Electronically Signed   By: Amie Portland M.D.   On: 06/24/2018 17:09   Dg Chest Port 1  View  Result Date: 06/24/2018 CLINICAL DATA:  72 year old female admitted status post cardiac arrest, PEA. EXAM: PORTABLE CHEST 1 VIEW COMPARISON:  06/20/2018 and earlier. FINDINGS: Portable AP semi upright view at 0516 hours. Endotracheal tube tip  at the level the clavicles. Enteric tube courses to the abdomen, tip not included. Stable left chest cardiac AICD. Stable right pigtail pleural tube. Substantially improved right upper lobe ventilation and regressed opacity. Trace right apical pneumothorax is visible. Streaky left greater than right lower lung opacity has increased. No pleural effusion. Stable cardiomegaly and mediastinal contours. IMPRESSION: 1. Stable lines and tubes. Trace residual right apical pneumothorax. 2. Substantially improved right upper lung ventilation could reflect resolving mucous plug or aspiration. 3. At the same time there is increased streaky bibasilar opacity suspicious for developing infection. Electronically Signed   By: Odessa Fleming M.D.   On: 06/24/2018 07:51   Dg Chest Portable 1 View  Result Date: 06/04/2018 CLINICAL DATA:  Chest tube placement EXAM: PORTABLE CHEST 1 VIEW COMPARISON:  06/22/2018, 05/18/2018, 05/17/2017 CT FINDINGS: Endotracheal tube tip is about 4.8 cm superior to the carina. Esophageal tube tip is below the diaphragm but not included. Interim placement of right-sided chest tube with curled tip positioned over the right upper lateral chest. Decreased right pneumothorax with trace lateral residual pneumothorax. Left sided pacing device as before. Worsened consolidation in the right upper lobe. Cardiomegaly. Aortic atherosclerosis. IMPRESSION: 1. Interim placement of right-sided chest tube tip positioned over the right upper lateral chest. Decreased right pneumothorax with trace residual right lateral pneumothorax noted. 2. Interim worsening in right upper lobe consolidation. 3. Cardiomegaly Electronically Signed   By: Jasmine Pang M.D.   On: 06/20/2018 18:16     Dg Chest Port 1 View  Result Date: 06/28/2018 CLINICAL DATA:  Cardiac arrest. EXAM: PORTABLE CHEST 1 VIEW COMPARISON:  05/07/2018 FINDINGS: Cardiac pads overlying the chest. Right pneumothorax measuring 20-30%. Endotracheal tube tip appears to be near the clavicular heads. Patient is mildly rotated towards the left. Patient has a left cardiac ICD. Evidence for interstitial pulmonary edema. Nasogastric tube extends into the abdomen. No obvious or displaced rib fractures. Heart size is difficult to assess due to patient rotation. IMPRESSION: 1. Right pneumothorax measuring 20-30%. 2. Endotracheal tube is appropriately positioned. 3. Pulmonary edema. These results were called by telephone at the time of interpretation on 06/04/2018 at 5:09 pm to Dr. Raeford Razor, who verbally acknowledged these results. Electronically Signed   By: Richarda Overlie M.D.   On: 06/16/2018 17:11

## 2018-06-25 NOTE — Progress Notes (Signed)
   06/25/18 1000  Clinical Encounter Type  Visited With Patient and family together  Visit Type Follow-up;Spiritual support  Referral From Nurse  Stress Factors  Patient Stress Factors Exhausted  Responded to [facility] consult for major life transition. Patient unresponsive and husband at bedside. Husband said that the report from the doctors is not good and he just want her to get better. Husband gave brief comments to my questions and not talkative. He was solely focused on his wife. I expressed that the Spiritual Care Department was a resource of support for patients and their families and to feel free to contact us 24/7. He expressed thank you. Provided spiritual and emotional support. Chaplain Marilynn Latino [phone]

## 2018-06-25 NOTE — Progress Notes (Signed)
Reason for consult: Anoxic injury  Subjective: Patient having more frequent whole-body myoclonus today morning.  Start patient on propofol drip.   ROS: negative except above  Examination  Vital signs in last 24 hours: Temp:  [98.2 F (36.8 C)-99.9 F (37.7 C)] 98.2 F (36.8 C) (10/27 1700) Pulse Rate:  [61-93] 81 (10/27 0800) Resp:  [16-33] 16 (10/27 1700) BP: (92-135)/(43-100) 94/51 (10/27 1700) SpO2:  [98 %-100 %] 100 % (10/27 1636) FiO2 (%):  [40 %] 40 % (10/27 1636) Weight:  [65 kg] 65 kg (10/27 0500)  General: lying in bed CVS: pulse-normal rate and rhythm RS: breathing comfortably Extremities: normal   Neuro: Mental Status: Patient does not respond to verbal stimuli.  Does not respond to deep sternal rub.  Does not follow commands.  No verbalizations noted.  Cranial Nerves: II: Pupils equal and reactive bilaterally, approximately 6 mm on both sides III,IV,VI: doll's response has not  V,VII: corneal reflex: Intact VIII: patient does not respond to verbal stimuli IX,X: Cough reflex intact XI: trapezius strength unable to test bilaterally XII: tongue strength unable to test Motor: Has minimal withdrawal versus flexor posturing in both upper extremities. Sensory: Does not respond to noxious stimuli in any extremity. Deep Tendon Reflexes:  2+ over bilateral patella Plantars: Upgoing bilaterally Cerebellar: Unable to perform  Basic Metabolic Panel: Recent Labs  Lab 06/21/18 1646 06/06/2018 1645 06/24/18 0057 06/24/18 2335 06/25/18 0811  NA 139 141 140 139  --   K 3.4*  3.4* 3.3* 3.3* 5.7*  --   CL 102 110 106 109  --   CO2 22 19* 19* 17*  --   GLUCOSE 138* 160* 201* 106*  --   BUN 40* 31* 30* 34*  --   CREATININE 3.26* 2.95* 2.86* 3.41*  --   CALCIUM 8.9 7.6* 8.0* 8.0*  --   MG 2.6* 2.1 2.1  --  2.2  PHOS  --   --  3.7  --  3.9    CBC: Recent Labs  Lab 06/21/18 1646 06/21/2018 1645 06/24/18 0057 06/25/18 0811  WBC 3.9* 4.7 11.2* 10.2  NEUTROABS  2.3 2.6  --  8.7*  HGB 12.4 11.3* 12.0 12.0  HCT 39.6 36.2 39.2 38.1  MCV 93.2 94.0 92.2 90.1  PLT 183 162 154 141*     Coagulation Studies: No results for input(s): LABPROT, INR in the last 72 hours.  This 17 hours of intensive EEG monitoring with simultaneous video monitoring consistent with severe encephalopathy of nonspecific etiologies but most likely related to cerebral anoxia given patient history.  Generalized bursts of spike and polyspike and wave discharges as well as epileptic myoclonus  consistent with significant cortical irritability. These findings typically constitute a poor prognosis in the setting of cerebral anoxia.    ASSESSMENT AND PLAN  72 year old female advanced cardiomyopathy of EF of just 10% status post cardiac arrest.  Neurology was consulted for myoclonic jerking on stimulus.  Routine EEG showed generalized epileptiform discharges and myoclonus associated with clinical activity.  Patient had initially received Keppra, was loaded with valproic acid yesterday and placed on long-term EEG.  Overnight myoclonic activity increased further.  Discussed with ICU team and patient was started on propofol.  Reviewed EEG, now showing burst suppression with less frequency of myoclonic jerks.  Her examination this morning shows intact brainstem reflexes, present on initial consultation by Dr. Wilford Corner.  However EEG is not favorable for a good prognosis.  Anoxic injury with myoclonus  Please continue propofol Continue Depakote  500 mg 3 times daily Continue Keppra 500 twice daily (renally dosed) Repeat CT head, may help with prognostication  Prognosis is guarded neurological standpoint at this time as she does have some reflexes intact but poor EEG consistent with anoxic injury.  Good neurological recovery may be further likely considering  overall comorbidities including advanced heart failure.  I spent 35 minutes in the care of this critically ill patient.  Patient has  anoxic injury with EEG showing epileptic myoclonus.  Her care requires discussion with ICU team, careful monitoring.  She is at high risk for neurological worsening due to worsening seizures, edema as well as complications of intubation and ICU stay.  Georgiana Spinner Aroor [facility] Pager Number [phone] For questions after 7pm please refer to AMION to reach the Neurologist on call

## 2018-06-25 NOTE — Progress Notes (Signed)
Maintenance complete. All impeds look good.

## 2018-06-25 NOTE — Progress Notes (Addendum)
Myoclonic jerking worse, involving the entire body & consistent at this time. On call Neurology contacted. Dr. Wilford Corner to review LTM EEG and assess pt at the bedside.

## 2018-06-25 NOTE — Progress Notes (Signed)
   06/25/18 1300  Clinical Encounter Type  Visited With Patient and family together  Visit Type Follow-up  Referral From Nurse  Responded to Page for emotional support. Husband of patient is upset because he had been informed that his wife condition was not improving. I shared some things with him of letting God have his way and for him to not take on the responsibility of her life. Her life is in God's hands. I encouraged him to talk to his wife and share his heart and feelings with her in private. He took my advise and is talking with his wife. I gave private time and returned and prayed with him. Provided the ministry of comfort words and spiritual support through prayer.

## 2018-06-25 NOTE — Procedures (Addendum)
Electroencephalography report.  Long-term monitoring  Data acquisition: [employer] 10-20 for electrodes placement.  18 channels EEG with additional EKG channel  Recording begins 06/24/2018 at 1515 Recording ends 06/25/2018 at 07 30  Day 1 CPT 95951  This intensive EEG monitoring with simultaneous video monitoring was performed for this patient status post cardiac arrest now unresponsive with occasional myoclonic jerks.  Background activities marked by attenuated slowing predominantly in the delta range distributed broadly nonreactive to external stimuli.  Superimposed pneumonia continues generalized bursts of fast polyspike and wave discharges present in a continuous fashion however without evolution.  Occasionally  bursts of generalized discharges were accompanied by myoclonic jerks.  Clinical interpretation: This 17 hours of intensive EEG monitoring with simultaneous video monitoring consistent with severe encephalopathy of nonspecific etiologies but most likely related to cerebral anoxia given patient history.  Generalized bursts of spike and polyspike and wave discharges as well as epileptic myoclonus  consistent with significant cortical irritability.  These findings typically constitute a poor prognosis in the setting of cerebral anoxia.  Clinical correlation is advised

## 2018-06-25 NOTE — Progress Notes (Signed)
Neuro hospitalist progress note  Subjective: Recalled by patient's RN for worsening myoclonic jerking. Patient had been given a load of Depakote this evening and hooked up to LTM EEG.  Objective: Brainstem reflexes still present. Frequent myoclonic jerking involving the whole body  Long-term EEG-formal read pending, preliminary review concerning for myoclonic status  Recommendations - Keppra 500 twice daily -Depakote 500 every 8 hours -Klonopin 1 mg every 8 as needed - If the myoclonic jerking does not respond to the above medications, consider propofol drip. -Continue on long-term EEG -Given these frank myoclonic jerkings, early in the course post cardiac arrest, portends poor neurological prognosis.  Neurology team will continue to follow with you.  -- Milon Dikes, MD [facility] Pager: [phone] If 7pm to 7am, please call on call as listed on AMION.

## 2018-06-25 NOTE — Progress Notes (Signed)
eLink Physician-Brief Progress Note Patient Name: Crystal Duran DOB: [birth year] MRN: [id number]   Date of Service  06/25/2018  HPI/Events of Note  Hyperkalemia - K+ = 6.1 and Creatinine = 3.41.   eICU Interventions  Will order: 1. Kayexalate 30 gm per tube now.  2. Repeat BMP at 3 AM.      Intervention Category Major Interventions: Electrolyte abnormality - evaluation and management  Sommer,Steven Eugene 06/25/2018, 9:03 PM

## 2018-06-26 DIAGNOSIS — Z978 Presence of other specified devices: Secondary | ICD-10-CM

## 2018-06-26 DIAGNOSIS — J939 Pneumothorax, unspecified: Secondary | ICD-10-CM

## 2018-06-26 DIAGNOSIS — Z7189 Other specified counseling: Secondary | ICD-10-CM

## 2018-06-26 DIAGNOSIS — G931 Anoxic brain damage, not elsewhere classified: Secondary | ICD-10-CM

## 2018-06-26 DIAGNOSIS — Z515 Encounter for palliative care: Secondary | ICD-10-CM

## 2018-06-26 LAB — CBC WITH DIFFERENTIAL/PLATELET
Abs Immature Granulocytes: 0.03 10*3/uL (ref 0.00–0.07)
BASOS ABS: 0 10*3/uL (ref 0.0–0.1)
Basophils Relative: 0 %
EOS ABS: 0 10*3/uL (ref 0.0–0.5)
EOS PCT: 0 %
HEMATOCRIT: 38.5 % (ref 36.0–46.0)
HEMOGLOBIN: 12.2 g/dL (ref 12.0–15.0)
Immature Granulocytes: 0 %
LYMPHS ABS: 0.9 10*3/uL (ref 0.7–4.0)
LYMPHS PCT: 12 %
MCH: 28.4 pg (ref 26.0–34.0)
MCHC: 31.7 g/dL (ref 30.0–36.0)
MCV: 89.5 fL (ref 80.0–100.0)
MONO ABS: 0.9 10*3/uL (ref 0.1–1.0)
MONOS PCT: 12 %
NRBC: 0 % (ref 0.0–0.2)
Neutro Abs: 5.7 10*3/uL (ref 1.7–7.7)
Neutrophils Relative %: 76 %
Platelets: 117 10*3/uL — ABNORMAL LOW (ref 150–400)
RBC: 4.3 MIL/uL (ref 3.87–5.11)
RDW: 19.1 % — ABNORMAL HIGH (ref 11.5–15.5)
WBC: 7.6 10*3/uL (ref 4.0–10.5)

## 2018-06-26 LAB — BASIC METABOLIC PANEL
Anion gap: 11 (ref 5–15)
BUN: 48 mg/dL — ABNORMAL HIGH (ref 8–23)
CO2: 19 mmol/L — ABNORMAL LOW (ref 22–32)
Calcium: 7.7 mg/dL — ABNORMAL LOW (ref 8.9–10.3)
Chloride: 110 mmol/L (ref 98–111)
Creatinine, Ser: 3.44 mg/dL — ABNORMAL HIGH (ref 0.44–1.00)
GFR calc Af Amer: 14 mL/min — ABNORMAL LOW (ref >60)
GFR calc non Af Amer: 12 mL/min — ABNORMAL LOW (ref >60)
Glucose, Bld: 136 mg/dL — ABNORMAL HIGH (ref 70–99)
Potassium: 3.7 mmol/L (ref 3.5–5.1)
Sodium: 140 mmol/L (ref 135–145)

## 2018-06-26 LAB — GLUCOSE, CAPILLARY
Glucose-Capillary: 114 mg/dL — ABNORMAL HIGH (ref 70–99)
Glucose-Capillary: 106 mg/dL — ABNORMAL HIGH (ref 70–99)
Glucose-Capillary: 136 mg/dL — ABNORMAL HIGH (ref 70–99)
Glucose-Capillary: 122 mg/dL — ABNORMAL HIGH (ref 70–99)
Glucose-Capillary: 134 mg/dL — ABNORMAL HIGH (ref 70–99)

## 2018-06-26 LAB — HEPATIC FUNCTION PANEL
ALT: 56 U/L — ABNORMAL HIGH (ref 0–44)
AST: 58 U/L — ABNORMAL HIGH (ref 15–41)
Albumin: 2.5 g/dL — ABNORMAL LOW (ref 3.5–5.0)
Alkaline Phosphatase: 90 U/L (ref 38–126)
Bilirubin, Direct: 0.4 mg/dL — ABNORMAL HIGH (ref 0.0–0.2)
Indirect Bilirubin: 0.7 mg/dL (ref 0.3–0.9)
Total Bilirubin: 1.1 mg/dL (ref 0.3–1.2)
Total Protein: 5.5 g/dL — ABNORMAL LOW (ref 6.5–8.1)

## 2018-06-26 LAB — MAGNESIUM: Magnesium: 2.1 mg/dL (ref 1.7–2.4)

## 2018-06-26 LAB — LACTIC ACID, PLASMA: Lactic Acid, Venous: 3.2 mmol/L (ref 0.5–1.9)

## 2018-06-26 LAB — PHOSPHORUS: Phosphorus: 3.9 mg/dL (ref 2.5–4.6)

## 2018-06-26 LAB — TROPONIN I: Troponin I: 0.95 ng/mL (ref <0.03)

## 2018-06-26 MED ORDER — VITAL HIGH PROTEIN PO LIQD
1000.0000 mL | ORAL | Status: DC
  Administered 2018-06-26: 1000 mL

## 2018-06-26 MED ORDER — VITAL HIGH PROTEIN PO LIQD: 1000.0000 mL | ORAL | Status: DC

## 2018-06-26 NOTE — Progress Notes (Signed)
Call [employer] rep, Dahlia Client, if need to turn ICD/pacer off before 7am on 10/29. [phone].

## 2018-06-26 NOTE — Progress Notes (Signed)
vLTM EEG complete. No skin breakdown 

## 2018-06-26 NOTE — Progress Notes (Signed)
Subjective: Myoclonus controlled with propofol.   Exam: Vitals:   06/26/18 0600 06/26/18 0700  BP: (!) 96/51 (!) 101/53  Pulse:    Resp: 16 17  Temp: 99 F (37.2 C) 98.8 F (37.1 C)  SpO2:     Gen: In bed, NAD Resp: non-labored breathing, no acute distress Abd: soft, nt  Neuro: on low dose(8 mcg/kg/min) propofol MS: doe snot open eyes or follow commands CN: PERRL, corneals intact, solls eye response intact.  Motor: no movement to nox stim in UE, flexion vs withdrawal in LE Sensory:as above  Pertinent Labs: Mg 2.3   Impression: [name] s/p cardiac arrest(PEA -> VF) with early post-anoxic myoclonus and GPEDs. There is some underlying slow activity as well, but no reported reactivity. In this setting, this is most consistent with a poor neurological prognosis.   If she were otherwise healthy, then with aggressive therapy, a trach/peg/LTACH to observe her could be reasonable, but with her age and comorbidities, I do not think there is any reasonable expectation for recovery.   Recommendations: 1) Agree with palliative care consultation.  2) continue propofol for symptomatic control.  3) continue depacon/keppra.  4) will follow.   This patient is critically ill and at significant risk of neurological worsening, death and care requires constant monitoring of vital signs, hemodynamics,respiratory and cardiac monitoring, neurological assessment, discussion with family, other specialists and medical decision making of high complexity. I spent 35 minutes of neurocritical care time  in the care of  this patient.  Crystal Slot, MD [facility] [phone]  If 7pm- 7am, please page neurology on call as listed in AMION. 06/26/2018  7:59 AM

## 2018-06-26 NOTE — Progress Notes (Signed)
Nutrition Follow-up  DOCUMENTATION CODES:   Not applicable  INTERVENTION:   Tube Feeding:  Vital High Protein @ 50 ml/hr Provides 1200 kcals, 105 g of protein and 1008 mL of free water   NUTRITION DIAGNOSIS:   Inadequate oral intake related to inability to eat as evidenced by NPO status.  Being addressed via TF   GOAL:   Patient will meet greater than or equal to 90% of their needs  Met  MONITOR:   Vent status, Labs, I & O's(GOC)  REASON FOR ASSESSMENT:   Ventilator    ASSESSMENT:   72 y/o female PMHx HF (EF 10%) s/p aicd, MI, hld/htn, ckd, cad, dm2. Suffered PEA arrest which progressed to VF. 5 minutes downtime prior to initiation of CPR and another 5 until ROSC. In ED, found R ptx s/p chest tube. Intubated on arrival to ED.   Concern for anoxic injury, poor prognosis; pot with end stage cardiomyopathy Patient remains intubated and sedated aon ventilator support; myoclonus controlled with propofol MV: 7.1 L/min Temp (24hrs), Avg:98.3 F (36.8 C), Min:97 F (36.1 C), Max:99.3 F (37.4 C)  Propofol: 3.1 ml/hr  Anuric AKI, not cadidate for CVVHD or HD  Vital High Protein infusing at 50 ml/hr via OGT, titrating to ordered goal of 55 ml/hr later today. Tolerating thus far  Labs: potassium 6.1 Meds: reviewed  Diet Order:   Diet Order            Diet NPO time specified  Diet effective now              EDUCATION NEEDS:   No education needs have been identified at this time  Skin:  Skin Assessment: Reviewed RN Assessment  Last BM:  10/25  Height:   Ht Readings from Last 1 Encounters:  06/05/2018 '5\' 4"'  (1.626 m)    Weight:   Wt Readings from Last 1 Encounters:  06/26/18 66.2 kg    Ideal Body Weight:  54.54 kg  BMI:  Body mass index is 25.05 kg/m.  Estimated Nutritional Needs:   Kcal:  1300 kcals (psu 2003b)  Protein:  100-115g Pro (1.6-1.8g/kg bw)  Fluid:  Per MD goals   Kerman Passey MS, RD, LDN, CNSC [phone] Pager  [phone] Weekend/On-Call Pager

## 2018-06-26 NOTE — Progress Notes (Signed)
Phlebotomy unable to draw labs r/t difficult stick. E-link made aware.

## 2018-06-26 NOTE — Consult Note (Signed)
Consultation Note Date: 06/26/2018   Patient Name: Crystal Duran  DOB: 01/04/1946  MRN: [id number]  Age / Sex: 72 y.o., female  PCP: Nolene Ebbs, MD Referring Physician: Brand Males, MD  Reason for Consultation: Establishing goals of care and Psychosocial/spiritual support  HPI/Patient Profile: 72 y.o. female   admitted on 06/10/2018 status post cardiac PEA arrest followed by V. Fib and comatose with GCS of 3. 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  Now with  chest tube.  Is in postresuscitative circulatory shock requiring Levophed.    Per neurology patient is early post-anoxic myoclonus and GPEDs.  In this setting, this is most consistent with a poor neurological prognosis.   Family face treatment option decisions, advanced directive decisions, and anticipatory care needs.   Clinical Assessment and Goals of Care:  This NP Wadie Lessen reviewed medical records, received report from team, assessed the patient and then meet at the patient's bedside along with her husband and friend (who is like a daughter/Crystal Duran)  to discuss diagnosis, prognosis, GOC, EOL wishes disposition and options.  Concept of Hospice and Palliative Care were discussed  Discussed current medical situation and overall poor prognosis.  The difference between a aggressive medical intervention path  and a palliative comfort care path for this patient at this time was had.  Values and goals of care important to patient and family were attempted to be elicited.  Husband speaks very clearly to his understanding that his wife would not want to "continue like this".  He has made decision to liberate his wife from the ventilator and allow a natural course.   He wishes to move in this direction tonight after the patient's siblings come to visit at  approximately 7:00 this evening.  Emotional support offered.  Created space and opportunity for husband to share life/realtionship review.  Patient was a Oncologist and loved by many.  Patient's son died only one year ago from heart disease.  Natural trajectory and expectations at EOL were discussed.  Questions and concerns addressed.       PMT will continue to support holistically.  Husband is decision maker   SUMMARY OF RECOMMENDATIONS    Code Status/Advance Care Planning:  DNR   No escalation of care/ de-activate ICD in preparation for terminal wean  Once family is ready for wean --coordinate with attending for orders    Symptom Management:    Initiate order set for terminal wean once family has come to visit and husband is ready; making comfort and dignity the main focus of care   Additional Recommendations (Limitations, Scope, Preferences):  Deactivate ICD  Continue current interventions until family is ready to liberate from vent and shift to comfort later this evening after siblings  have had time to visit.  No escalation of care  Psycho-social/Spiritual:   Desire for further Chaplaincy support:no  Additional Recommendations: Grief/Bereavement Support  Prognosis:   Once extubated and shift is  made for full comfort path, prognosis is likely hours to days.   Discharge Planning: Anticipate hospital death     Primary Diagnoses: Present on Admission: . Cardiac arrest [facility])   I have reviewed the medical record, interviewed the patient and family, and examined the patient. The following aspects are pertinent.  Past Medical History:  Diagnosis Date  . AICD (automatic cardioverter/defibrillator) present    [employer] ICD [phone]  . Arthritis    "hands, back" (04/07/2018)  . CAP (community acquired pneumonia) 04/07/2018  . 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 CHF (congestive heart failure) (HCC)    systolic  .  Chronic lower back pain   . CKD (chronic kidney disease), stage III ([place])   . History of tobacco abuse   . HTN (hypertension)   . Hypercholesteremia   . Hypothyroidism   . Ischemic cardiomyopathy    severe  . MI (myocardial infarction) ([place]) 02/2009   Hx of inferior wall MI   . On home oxygen therapy    "2-4L prn" (04/07/2018)  . Pneumonia    "once before today" (04/07/2018)  . Type 2 diabetes mellitus ([place])    Social History   Socioeconomic History  . Marital status: Married    Spouse name: Not on file  . Number of children: Not on file  . Years of education: Not on file  . Highest education level: Not on file  Occupational History  . Occupation: retired  Scientific laboratory technician  . Financial resource strain: Not on file  . Food insecurity:    Worry: Not on file    Inability: Not on file  . Transportation needs:    Medical: Not on file    Non-medical: Not on file  Tobacco Use  . Smoking status: Former Smoker    Packs/day: 0.50    Years: 5.00    Pack years: 2.50    Types: Cigarettes    Last attempt to quit: 1995    Years since quitting: 24.8  . Smokeless tobacco: Never Used  Substance and Sexual Activity  . Alcohol use: Not Currently  . Drug use: Never  . Sexual activity: Not Currently  Lifestyle  . Physical activity:    Days per week: Not on file    Minutes per session: Not on file  . Stress: Not on file  Relationships  . Social connections:    Talks on phone: Not on file    Gets together: Not on file    Attends religious service: Not on file    Active member of club or organization: Not on file    Attends meetings of clubs or organizations: Not on file    Relationship status: Not on file  Other Topics Concern  . Not on file  Social History Narrative   Married, lives in [place], retired Pharmacist, hospital.     Family History  Problem Relation Age of Onset  . Coronary artery disease Other        postive family Hx   Scheduled Meds: . amiodarone  400 mg Per Tube TID  .  aspirin  81 mg Per Tube Daily  . chlorhexidine gluconate (MEDLINE KIT)  15 mL Mouth Rinse BID  . feeding supplement (PRO-STAT SUGAR FREE 64)  30 mL Per Tube BID  . feeding supplement (VITAL HIGH PROTEIN)  1,000 mL Per Tube Q24H  . heparin  5,000 Units Subcutaneous Q8H  . insulin aspart  0-9 Units Subcutaneous Q4H  . levothyroxine  87.5 mcg Intravenous Daily  . mouth rinse  15 mL Mouth Rinse 10 times per day  . rosuvastatin  10 mg Per Tube q1800   Continuous Infusions: . sodium chloride 10 mL/hr at 06/26/18 1100  . famotidine (PEPCID) IV Stopped (06/26/18 1032)  . levETIRAcetam Stopped (06/26/18 0324)  . propofol (DIPRIVAN) infusion 8 mcg/kg/min (06/26/18 1100)  . valproate sodium Stopped (06/26/18 0629)   PRN Meds:.albuterol, clonazePAM, fentaNYL (SUBLIMAZE) injection, midazolam, midazolam Medications Prior to Admission:  Prior to Admission medications   Medication Sig Start Date End Date Taking? Authorizing Provider  albuterol (PROVENTIL HFA;VENTOLIN HFA) 108 (90 Base) MCG/ACT inhaler Inhale 1-2 puffs into the lungs every 4 (four) hours as needed for wheezing or shortness of breath.    [provider]  amiodarone (PACERONE) 200 MG tablet Oct 11-25, 446m (two tabs) 2x per day; Oct 26-Nov 7, 2074m(One tab) 2x per day; Nov 8, 20068mOne Tab) 1x per day 06/09/18   KleDeboraha SprangD  aspirin EC 81 MG tablet Take 81 mg by mouth daily.    [provider]  budesonide-formoterol (SYMBICORT) 80-4.5 MCG/ACT inhaler Inhale 2 puffs into the lungs 2 (two) times daily. 05/21/18   MadBarton DuboisD  carvedilol (COREG) 25 MG tablet Take 0.5 tablets (12.5 mg total) by mouth 2 (two) times daily. 06/29/16   HarCharolette ForwardD  dextromethorphan-guaiFENesin (MUCINEX DM) 30-600 MG 12hr tablet Take 1 tablet by mouth 2 (two) times daily. Patient not taking: Reported on 06/21/2018 05/21/18   MadBarton DuboisD  furosemide (LASIX) 40 MG tablet Take 0.5 tablets (20 mg total) by mouth  daily. Patient taking differently: Take 80 mg by mouth 2 (two) times daily.  05/21/18   MadBarton DuboisD  levothyroxine (SYNTHROID, LEVOTHROID) 175 MCG tablet Take 175 mcg by mouth daily before breakfast. 03/27/18   [provider]  metFORMIN (GLUCOPHAGE) 500 MG tablet Take 500 mg by mouth daily.    [provider]  nitroGLYCERIN (NITROSTAT) 0.4 MG SL tablet Place 1 tablet (0.4 mg total) under the tongue every 5 (five) minutes x 3 doses as needed for chest pain. 06/29/16   HarCharolette ForwardD  OXYGEN Inhale 2 L into the lungs continuous.    [provider]  rosuvastatin (CRESTOR) 20 MG tablet Take 0.5 tablets (10 mg total) by mouth daily. 04/11/18   HarCharolette ForwardD  sacubitril-valsartan (ENTRESTO) 97-103 MG Take 1 tablet by mouth 2 (two) times daily. HOLD UNTIL FOLLOW UP WITH CARDIOLOGY SERVICE. 05/21/18   MadBarton DuboisD   Allergies  Allergen Reactions  . Eggs Or Egg-Derived Products Nausea And Vomiting   Review of Systems  Unable to perform ROS: Intubated    Physical Exam  Constitutional: She appears well-developed. She appears ill. She is sedated and intubated.  Cardiovascular: Normal rate, regular rhythm and normal heart sounds.  Pulmonary/Chest: She is intubated.  Neurological: She is unresponsive.  Skin: Skin is warm and dry.    Vital Signs: BP (!) 117/55   Pulse 69   Temp 99.5 F (37.5 C)   Resp 17   Ht '5\' 4"'  (1.626 m)   Wt 66.2 kg   SpO2 100%   BMI 25.05 kg/m  Pain Scale: CPOT   Pain Score: 0-No pain   SpO2: SpO2: 100 % O2 Device:SpO2: 100 % O2 Flow Rate: .   IO: Intake/output summary:   Intake/Output Summary (Last 24 hours) at 06/26/2018 1219 Last data filed at 06/26/2018 1100 Gross per 24 hour  Intake  1705.53 ml  Output 454 ml  Net 1251.53 ml    LBM: Last BM Date: 06/16/2018 Baseline Weight: Weight: 60.9 kg Most recent weight: Weight: 66.2 kg     Palliative Assessment/Data:   Discussed with Dr Elsworth Soho and bedside  nursing  Time In: 1200 Time Out: 1315 Time Total: 75 minutes Greater than 50%  of this time was spent counseling and coordinating care related to the above assessment and plan.  Signed by: Wadie Lessen, NP   Please contact Palliative Medicine Team phone at [phone] for questions and concerns.  For individual provider: See Shea Evans

## 2018-06-26 NOTE — Progress Notes (Signed)
[facility]  Patient Status: Active (receiving services up to time of hospitalization)  [facility] is providing the following services: RN and OT  If patient discharges after hours, please call [phone].   Crystal Duran 06/26/2018, 12:08 PM

## 2018-06-26 NOTE — Progress Notes (Signed)
NAME:  Crystal Duran, MRN:  [id number], DOB:  1945-11-01, LOS: 3 ADMISSION DATE:  07-13-18, CONSULTATION DATE:  07/13/18 REFERRING MD:  Juleen China CHIEF COMPLAINT:  Cardiac Arrest   Brief History   Crystal Duran is a 72 y.o. 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  She presented to [facility] 10/25 after PEA arrest which then progressed to VF.  She had 5 minutes of downtime before any CPR was started, and then an additional 5 minutes or so prior to ROSC.  In ED, she was found to have a large right sided PTX post CPR for which a small bore chest tube was placed by EDP. 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  Significant Hospital Events   10/25 > admit. 10/26 = remains comatose. Overnight myoclonus and seen by neuro - EEG starting. AICD compatible MRI recommended. Rx keppra. RN reports myoclonus recurrence during oral care. 10/27 >>myoclonus needing diprivan. c-EEG ongoing. Bite block lodged in throat - needed roc and glidescope to dislodge.  Consults: date of consult/date signed off & final recs:  None.  Procedures (surgical and bedside):  ETT 10/25 > Right small bore chest tube 10/25 >   Significant Diagnostic Tests:  CXR 10/25 >  large right PTX, pulmonary edema. CT head 10/25 > no acute process.  Micro Data:  Blood 10/25 > ng Sputum 10/25 >  Urine 10/25 > ng  Antimicrobials:  None.   Subjective:  06/26/2018 -sedated on propofol drip Critically ill, intubated Anuric Afebrile  Objective:  Blood pressure (!) 117/55, pulse 69, temperature 99.5 F (37.5 C), resp. rate 17, height 5\' 4"  (1.626 m), weight 66.2 kg, SpO2 100 %.    Vent Mode: PRVC FiO2 (%):  [40 %] 40 % Set Rate:  [16 bmp] 16 bmp Vt Set:  [430 mL] 430 mL PEEP:  [5 cmH20] 5 cmH20 Plateau Pressure:  [15 cmH20-18 cmH20] 16 cmH20   Intake/Output Summary (Last 24 hours) at 06/26/2018 1353 Last data filed at 06/26/2018 1100 Gross per 24 hour  Intake 1679.68 ml    Output 454 ml  Net 1225.68 ml   Filed Weights   06/24/18 0430 06/25/18 0500 06/26/18 0313  Weight: 63 kg 65 kg 66.2 kg    Critically ill, orally intubated, unresponsive on propofol drip Elderly, no pallor, icterus Decreased breath sounds bilateral S1-S2 normal, no rub no murmur Soft and nontender abdomen Anasarca Right chest tube does not show any air leak  Chest x-ray personally reviewed which shows ET tube and right chest tube in position, no new infiltrates      Assessment & Plan:    Concern for anoxic injury. - Myoclonus - per neuro; on keprra - c-EEG ongoing -Poor prognosis for meaningful neurological recovery given to husband  #PEA arrest  With post circulatory shock- unclear etiology.  Per discussions with EP, pt has been in and out of VT for quite some time now.   Underlying end stage ischemic cardiomyopathy.    PLAN -MAP > 55 and sbp > 95, off pressors & do not reinstate -  DNR in the event of recurrent arrest. - Continue preadmission ASA,  Amiodarone increased to 400mg  TID after discussion with EP.   Hx HTN, HLD, MI. - Continue preadmission rosuvastatin. - Hold preadission carvedilol, furosemide, entresto.  Acute resp failure PLAN - PRVC -Not a candidate for spontaneous breathing trial   Right pneumothorax - presumed secondary to CPR.  S/p small bore  chest tube placement in ED.  - ct  Chest tube to 20cm suction. - Follow CXR.   AKI with CKD  -Creatinine has plateaued, potassium is improved - not a CRRT or HD candidate   Hx DM. - SSI. - Hold preadmission metformin.  Hx hypthoyrodism. - Continue preadmission synthroid,  IV formulation.  GI  - ct  TF  Disposition / Summary of Today's Plan 06/26/18   Myoclonic seizures have stopped with propofol but poor prognosis for meaningful neurological recovery given to husband.  Care limitations placed Plan will be for withdrawal of care when husband indicates that he is ready, may be even later  today       Code Status: DNR. Family Communication: husband updated    ATTESTATION & SIGNATURE   The patient is critically ill with multiple organ systems failure and requires high complexity decision making for assessment and support, frequent evaluation and titration of therapies, application of advanced monitoring technologies and extensive interpretation of multiple databases. Critical Care Time devoted to patient care services described in this note independent of APP/resident  time is 33 minutes.   Cyril Mourning MD. Tonny Bollman. [facility] Pager [phone] If no response call [phone]   06/26/2018

## 2018-06-26 NOTE — Procedures (Signed)
Electroencephalography report.  Long-term monitoring  Data acquisition: [employer] 10-20 for electrodes placement.  18 channels EEG with additional EKG channel  Recording begins 06/25/2018 at 07 30 Recording ends 06/26/2018 at 07 30  Day 2 CPT 95951  This intensive EEG monitoring with simultaneous video monitoring was performed for this patient status post cardiac arrest now unresponsive with occasional myoclonic jerks.  Day 1 Background activities marked by attenuated slowing predominantly in the delta range distributed broadly nonreactive to external stimuli.  Superimposed pneumonia continues generalized bursts of fast polyspike and wave discharges present in a continuous fashion however without evolution.  Occasionally  bursts of generalized discharges were accompanied by myoclonic jerks.  Day 2 : Background activities marked by attenuated background activity slowing ranging between 2-2 times reaching 5 cps distributed broadly.  Superimposed fairly frequent bursts of generalized anterior dominant fast polyspike and wave discharges present during first couple hours of recording and around 1 AM.  Outside of this times more continues background activities with a superimposed isolated generalized spike and polyspike and wave discharges present with at times right lateralizing features.  Clinical interpretation: This day 2  of intensive EEG monitoring with simultaneous video monitoring is abnormal due to non reactive  background slowing  consistent with severe encephalopathy of nonspecific etiologies but most likely related to cerebral anoxia given patient history.  Generalized bursts of spike and polyspike and wave discharges still present but improved, suggestive of some improvement with cortical irritability.

## 2018-06-26 NOTE — Plan of Care (Signed)
  Problem: Nutrition: Goal: Adequate nutrition will be maintained Outcome: Progressing   Problem: Safety: Goal: Ability to remain free from injury will improve Outcome: Progressing   Problem: Skin Integrity: Goal: Risk for impaired skin integrity will decrease Outcome: Progressing   Problem: Respiratory: Goal: Ability to maintain a clear airway and adequate ventilation will improve Outcome: Progressing   Problem: Cardiac: Goal: Ability to achieve and maintain adequate cardiopulmonary perfusion will improve Outcome: Progressing   Problem: Elimination: Goal: Will not experience complications related to urinary retention Outcome: Not Progressing

## 2018-06-26 NOTE — Progress Notes (Signed)
Nurse advised that the IV team does not start IVs for lab draws.

## 2018-06-27 DIAGNOSIS — J9621 Acute and chronic respiratory failure with hypoxia: Secondary | ICD-10-CM

## 2018-06-27 LAB — GLUCOSE, CAPILLARY
Glucose-Capillary: 113 mg/dL — ABNORMAL HIGH (ref 70–99)
Glucose-Capillary: 130 mg/dL — ABNORMAL HIGH (ref 70–99)
Glucose-Capillary: 111 mg/dL — ABNORMAL HIGH (ref 70–99)

## 2018-06-27 LAB — CBC WITH DIFFERENTIAL/PLATELET
Abs Immature Granulocytes: 0.02 10*3/uL (ref 0.00–0.07)
BASOS ABS: 0 10*3/uL (ref 0.0–0.1)
BASOS PCT: 0 %
EOS ABS: 0 10*3/uL (ref 0.0–0.5)
EOS PCT: 0 %
HCT: 38.3 % (ref 36.0–46.0)
Hemoglobin: 12.7 g/dL (ref 12.0–15.0)
Immature Granulocytes: 0 %
LYMPHS ABS: 0.5 10*3/uL — AB (ref 0.7–4.0)
LYMPHS PCT: 9 %
MCH: 29.1 pg (ref 26.0–34.0)
MCHC: 33.2 g/dL (ref 30.0–36.0)
MCV: 87.6 fL (ref 80.0–100.0)
Monocytes Absolute: 0.5 10*3/uL (ref 0.1–1.0)
Monocytes Relative: 8 %
NRBC: 0 % (ref 0.0–0.2)
Neutro Abs: 4.7 10*3/uL (ref 1.7–7.7)
Neutrophils Relative %: 83 %
Platelets: 105 10*3/uL — ABNORMAL LOW (ref 150–400)
RBC: 4.37 MIL/uL (ref 3.87–5.11)
RDW: 18.6 % — AB (ref 11.5–15.5)
WBC: 5.7 10*3/uL (ref 4.0–10.5)

## 2018-06-27 LAB — MAGNESIUM: MAGNESIUM: 2.1 mg/dL (ref 1.7–2.4)

## 2018-06-27 LAB — PHOSPHORUS: PHOSPHORUS: 3.3 mg/dL (ref 2.5–4.6)

## 2018-06-27 MED ORDER — SODIUM CHLORIDE 0.9 % IV SOLN
2.0000 mg/h | INTRAVENOUS | Status: DC
  Administered 2018-06-27: 2 mg/h via INTRAVENOUS
  Filled 2018-06-27: qty 10

## 2018-06-27 MED ORDER — MIDAZOLAM BOLUS VIA INFUSION (WITHDRAWAL LIFE SUSTAINING TX)
2.0000 mg | INTRAVENOUS | Status: DC | PRN
  Filled 2018-06-27: qty 4

## 2018-06-27 MED ORDER — GLYCOPYRROLATE 0.2 MG/ML IJ SOLN
0.2000 mg | Freq: Four times a day (QID) | INTRAMUSCULAR | Status: DC
  Administered 2018-06-27: 0.2 mg via INTRAVENOUS
  Filled 2018-06-27: qty 1

## 2018-06-27 MED ORDER — FENTANYL BOLUS VIA INFUSION
50.0000 ug | INTRAVENOUS | Status: DC | PRN
  Filled 2018-06-27: qty 200

## 2018-06-27 MED ORDER — FENTANYL 2500MCG IN NS 250ML (10MCG/ML) PREMIX INFUSION
0.0000 ug/h | INTRAVENOUS | Status: DC
  Administered 2018-06-27: 100 ug/h via INTRAVENOUS
  Filled 2018-06-27: qty 250

## 2018-06-28 LAB — CULTURE, BLOOD (ROUTINE X 2)
CULTURE: NO GROWTH
CULTURE: NO GROWTH
Special Requests: ADEQUATE

## 2018-06-28 LAB — CULTURE, RESPIRATORY

## 2018-06-28 LAB — CULTURE, RESPIRATORY W GRAM STAIN

## 2018-06-30 ENCOUNTER — Telehealth: Payer: Self-pay | Admitting: Pulmonary Disease

## 2018-06-30 NOTE — Discharge Summary (Signed)
   NAME:  Crystal Duran, MRN:  [id number], DOB:  1945-09-04, LOS: 4 ADMISSION DATE:  06/21/2018, CONSULTATION DATE:  06/14/2018 REFERRING MD:  Juleen China CHIEF COMPLAINT:  Cardiac Arrest   Brief History   Crystal Duran is a 72 y.o. 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  She presented to [facility] 10/25 after PEA arrest which then progressed to VF.  She had 5 minutes of downtime before any CPR was started, and then an additional 5 minutes or so prior to ROSC.  In ED, she was found to have a large right sided PTX post CPR for which a small bore chest tube was placed by EDP. 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  Significant Hospital Events   10/25 > admit. 10/26 = remains comatose. Overnight myoclonus and seen by neuro - EEG starting. AICD compatible MRI recommended. Rx keppra. RN reports myoclonus recurrence during oral care. 10/27 >>myoclonus needing diprivan. c-EEG ongoing. Bite block lodged in throat - needed roc and glidescope to dislodge.  Consults: date of consult/date signed off & final recs:  None.  Procedures (surgical and bedside):  ETT 10/25 > Right small bore chest tube 10/25 >   Significant Diagnostic Tests:  CXR 10/25 >  large right PTX, pulmonary edema. CT head 10/25 > no acute process.  Micro Data:  Blood 10/25 > ng Sputum 10/25 >  Urine 10/25 > ng  Antimicrobials:  None.    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 - Myoclonus - per neuro; on keprra - c-EEG ongoing -Poor prognosis for meaningful neurological recovery given to husband  #PEA arrest  With post circulatory shock- Underlying end stage ischemic cardiomyopathy.  PLAN -MAP > 55 and sbp > 95, off pressors & do not reinstate -  DNR in the event of recurrent arrest. - Continue preadmission ASA,  Amiodarone increased to 400mg  TID after discussion with EP.   Hx HTN, HLD, MI. - Continue preadmission rosuvastatin. - Hold preadission  carvedilol, furosemide, entresto.  Acute resp failure PLAN - PRVC -Not a candidate for spontaneous breathing trial   Right pneumothorax - presumed secondary to CPR.  S/p small bore chest tube placement in ED.    AKI with CKD  -Creatinine has plateaued, potassium is improved - not a CRRT or HD candidate   Hx DM. - SSI. - Hold preadmission metformin.  Hx hypthoyrodism. - Continue preadmission synthroid,  IV formulation.   COURSE  Myoclonic seizures  stopped with propofol but poor prognosis for meaningful neurological recovery given to husband.  Care limitations placed Life support was withdrawn and she passed away soon after.  Cause of death-chronic systolic heart failure, ischemic cardiomyopathy, diabetes type 2    Cyril Mourning MD. FCCP. [facility]   06/29/2018

## 2018-06-30 NOTE — Progress Notes (Signed)
Patient ID: LIDIE GLADE, female   DOB: [birth year], 72 y.o.   MRN: [id number]  This NP visited patient at the bedside as a follow up to  yesterday's GOCs meeting, for palliative medicine needs and emotional support.  Patient's husband is at bedside.  He tells me that the plan is to extubate  the patient today early afternoon.  Tells me that he has no questions or concerns at this time.    We discussed how the extubation would take place; the visuals and likely trajectory after extubation.  Prognosis is likely hours to days Emotional support offered.  Sat quietly at bedside with the patient and her husband Offered chaplain services and declined at this time.  Discussed with bedside nursing    palliative medicine will continue to support holistically  Total time spent on the unit was 25 minutes  Greater than 50% of the time was spent in counseling and coordination of care  Lorinda Creed NP  Palliative Medicine Team Team Phone # [phone] Pager [phone]

## 2018-06-30 NOTE — Progress Notes (Signed)
Husband agreeable to withdrawal of life support. We will transition from propofol to Versed and ensure that she does not have myoclonus. Once that is achieved we will put her on a fentanyl drip, wean her on pressure support and extubate to comfort. Withdrawal orders written. AICD will need to be turned off.  Comer Locket Vassie Loll MD

## 2018-06-30 NOTE — Telephone Encounter (Signed)
06/30/18 Original Cremation/DC Received from [facility] for Dr. Vassie Loll to sign at [facility] Monday.

## 2018-06-30 NOTE — Progress Notes (Signed)
Pt extubated per withdrawal order.  RN and family at bedside. 

## 2018-06-30 NOTE — Progress Notes (Signed)
On-call chaplain responded to page from nurse.  Family planned to withdraw life support within the hour.  Chaplain provided ministry of presence.  Declined prayer and scripture but wished for chaplain to stay and be with them. Chaplain called to another patient on floor but is continuing to be present to both families as able. Lynnell Chad  pager [phone]

## 2018-06-30 NOTE — Progress Notes (Signed)
Noted change to full comfort care. Please call if neurology can be of any further assistance.   Ritta Slot, MD [facility] [phone]  If 7pm- 7am, please page neurology on call as listed in [facility].

## 2018-06-30 DEATH — deceased

## 2018-07-04 ENCOUNTER — Telehealth: Payer: Self-pay

## 2018-07-04 LAB — CUP PACEART INCLINIC DEVICE CHECK
Brady Statistic RV Percent Paced: 0 %
HighPow Impedance: 43.875
Implantable Lead Implant Date: 20110622
Lead Channel Impedance Value: 325 Ohm
Lead Channel Pacing Threshold Amplitude: 0.75 V
Lead Channel Pacing Threshold Pulse Width: 0.4 ms
Lead Channel Sensing Intrinsic Amplitude: 11.7 mV
Lead Channel Setting Pacing Amplitude: 2 V
MDC IDC LEAD LOCATION: 753860
MDC IDC MSMT BATTERY REMAINING LONGEVITY: 33 mo
MDC IDC PG IMPLANT DT: 20110622
MDC IDC SESS DTM: 20191011160147
MDC IDC SET LEADCHNL RV PACING PULSEWIDTH: 0.4 ms
MDC IDC SET LEADCHNL RV SENSING SENSITIVITY: 0.5 mV
Pulse Gen Serial Number: 608161

## 2018-07-04 NOTE — Telephone Encounter (Signed)
On 07/04/18 I received the d/c back from Doctor Vassie Loll. I got the d/c ready and called the funeral home to let them know the d/c is ready for pickup.

## 2018-08-04 ENCOUNTER — Encounter: Payer: Medicare HMO | Admitting: Internal Medicine

## 2019-12-07 IMAGING — CT CT HEAD W/O CM
4 series · 17 of 47 positions shown, 19 images · non-contrast
Comparison: 02/07/2010

CLINICAL DATA: Unexplained altered level of consciousness.
Witnessed cardiac arrest.

EXAM:
CT HEAD WITHOUT CONTRAST
TECHNIQUE: Contiguous axial images were obtained from the base of the skull
through the vertex without intravenous contrast.

[Series 3: head bone · axial · 0.42mm/px · z∈[-136,-82]mm · 4 of 78 slices shown]
[im 8/78  bone]
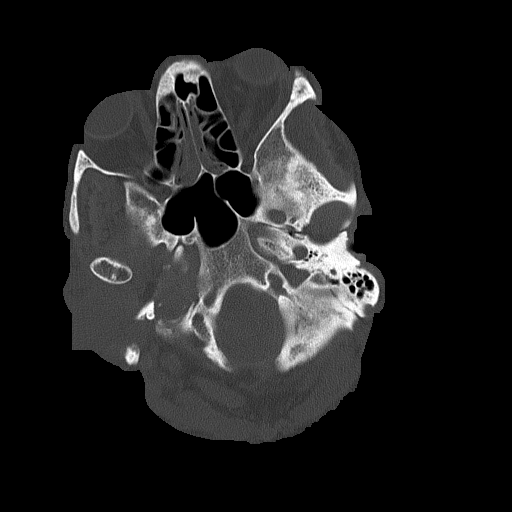
[im 16/78  bone]
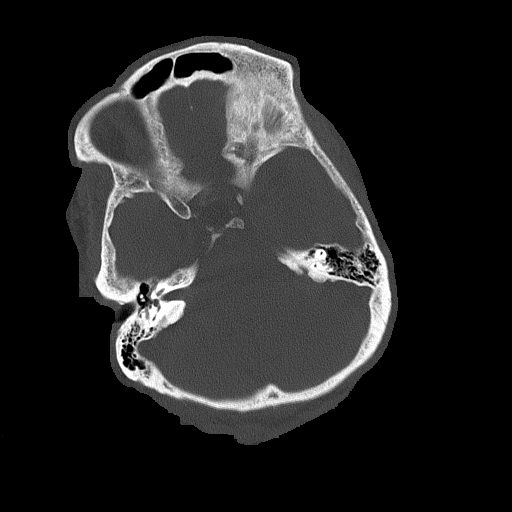
[im 24/78  bone]
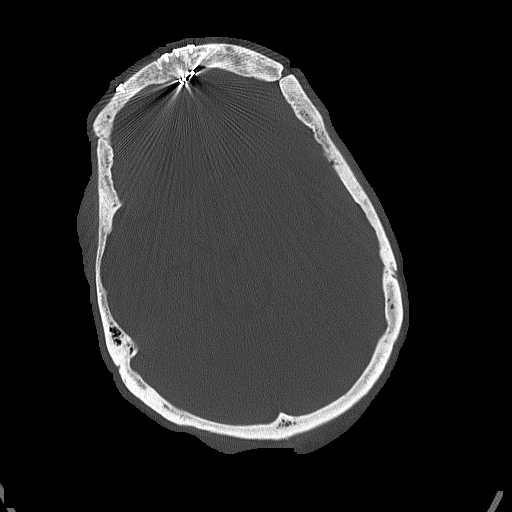
[im 35/78  bone]
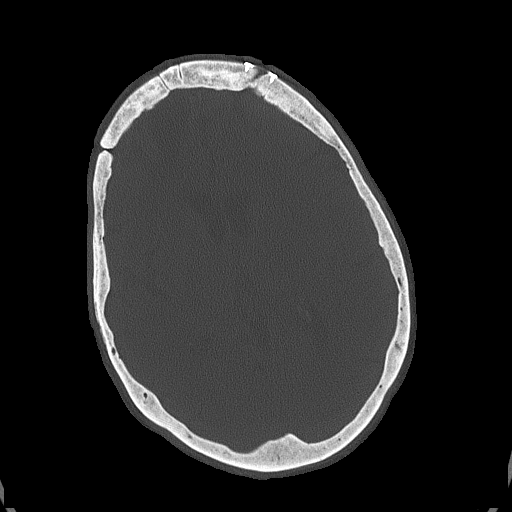

[Series 4: head without · axial · non-contrast · 0.42mm/px · z∈[-135,-15]mm · 7 of 32 slices shown, 9 images]
[im 4/32  brain]
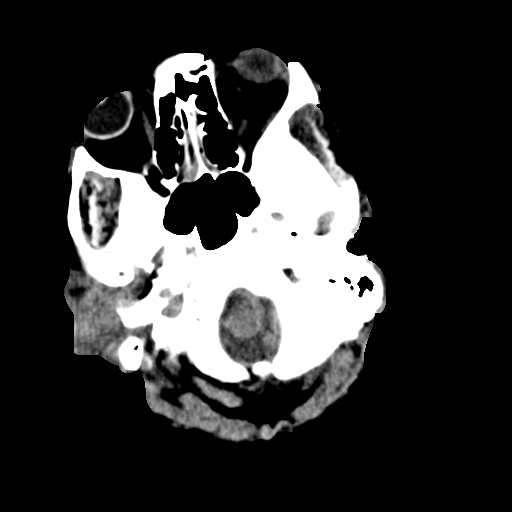
[im 4/32  bone]
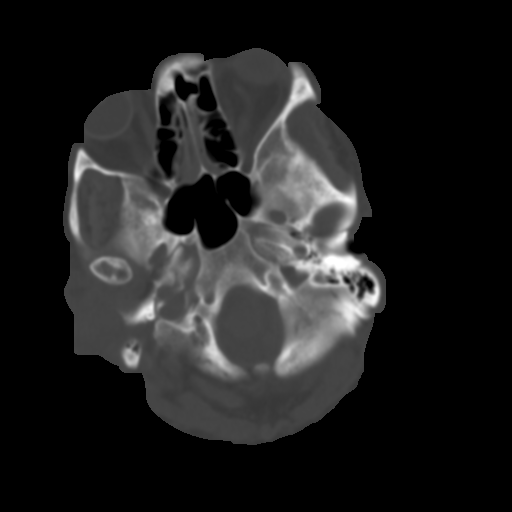
[im 8/32  brain]
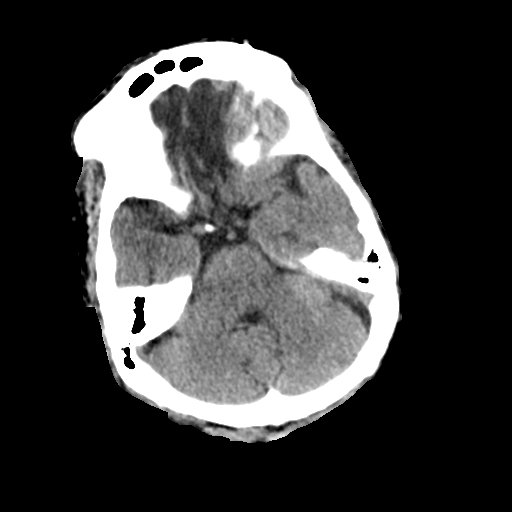
[im 12/32  brain]
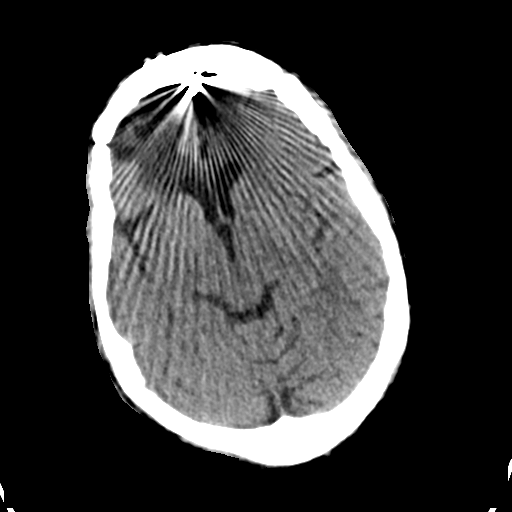
[im 16/32  brain]
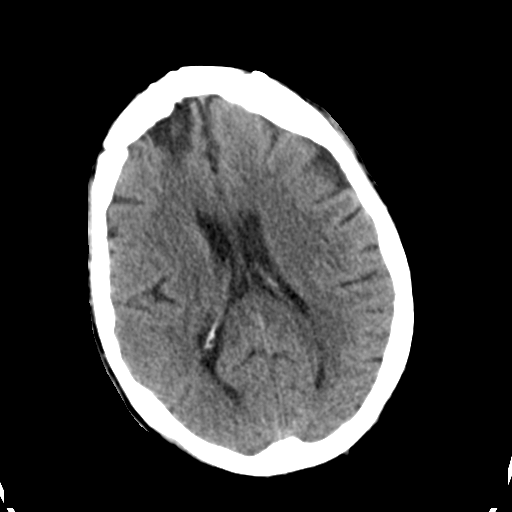
[im 20/32  brain]
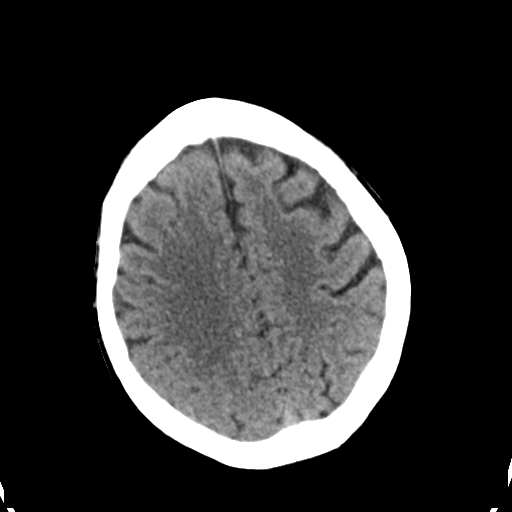
[im 20/32  bone]
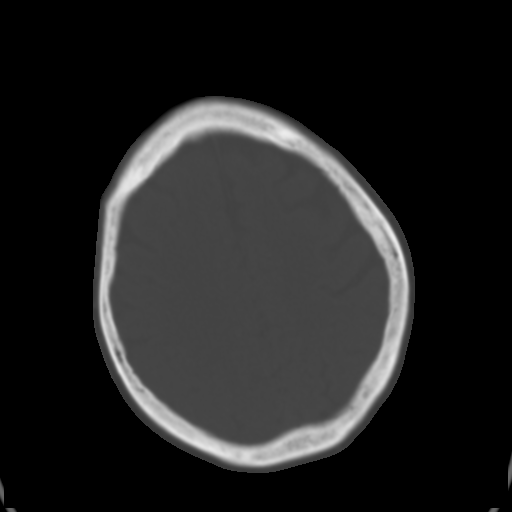
[im 24/32  brain]
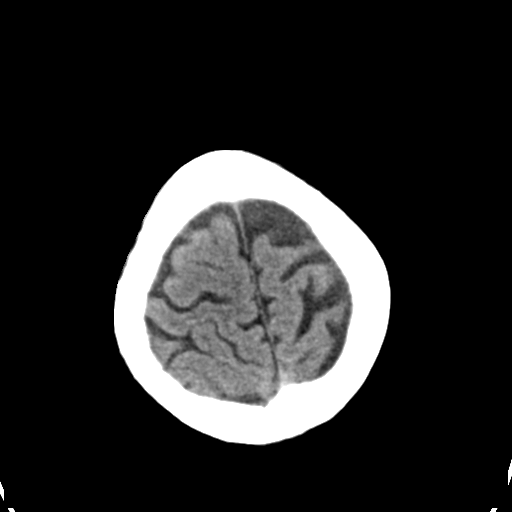
[im 28/32  brain]
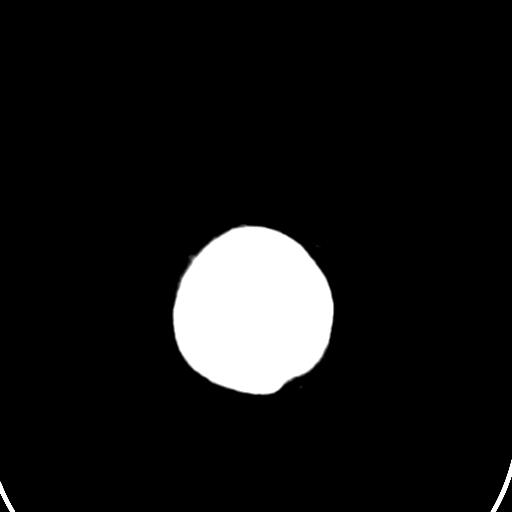

[Series 5: head without cor · coronal · non-contrast · 0.31mm/px · 3 of 64 slices shown]
[im 22/64  brain]
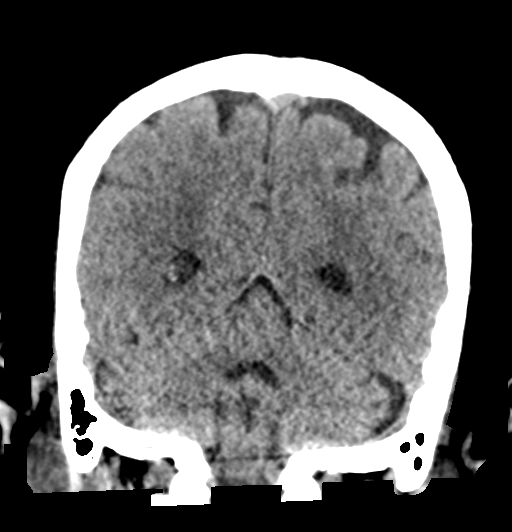
[im 29/64  brain]
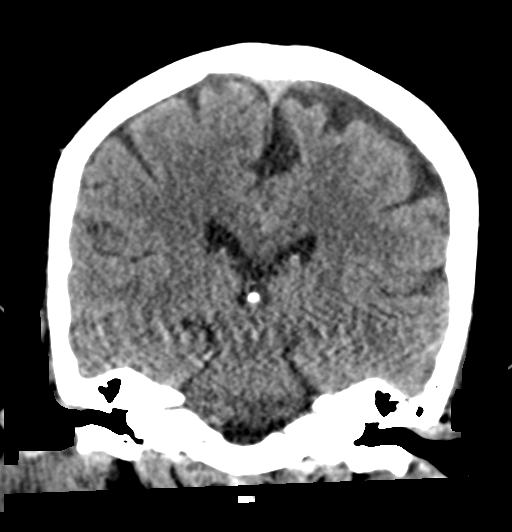
[im 36/64  brain]
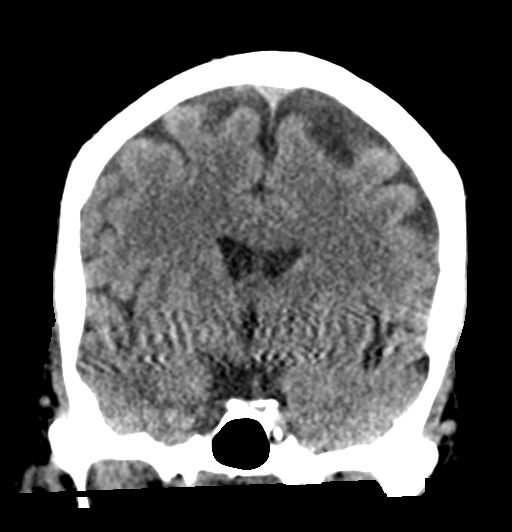

[Series 6: head without sag · sagittal · non-contrast · 0.30mm/px · 3 of 50 slices shown]
[im 17/50  brain]
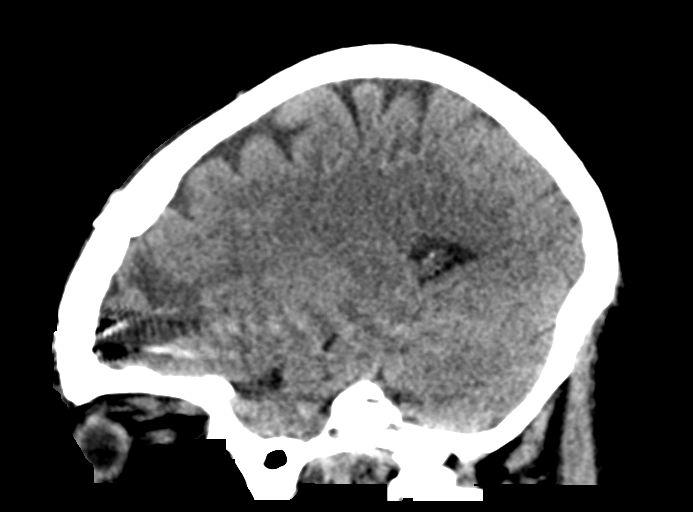
[im 25/50  brain]
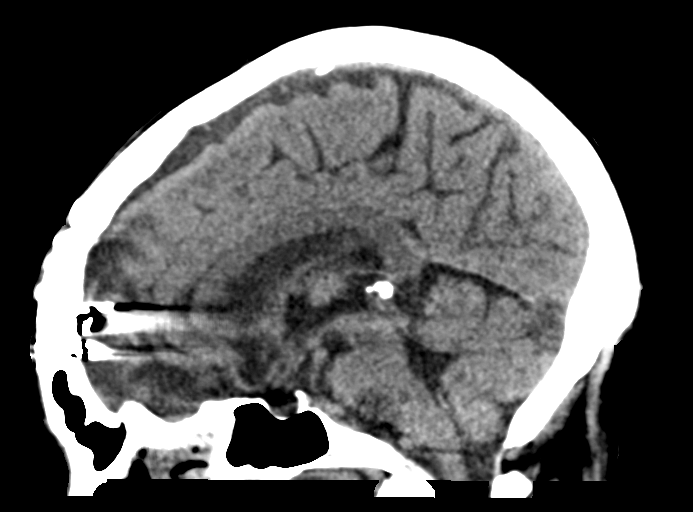
[im 33/50  brain]
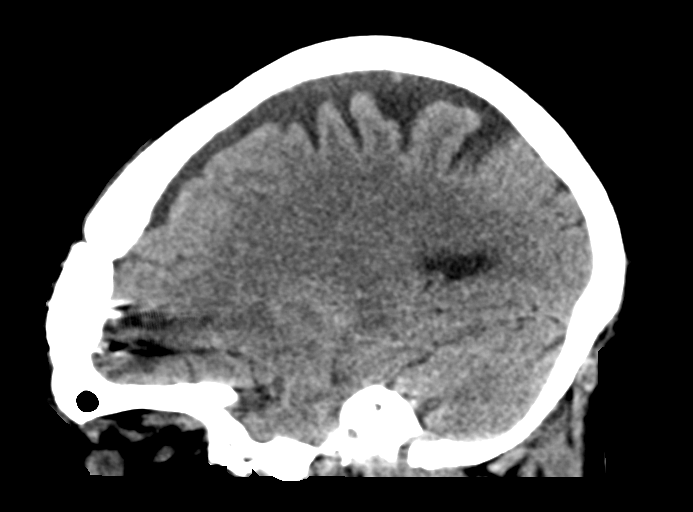

[17 of 47 positions shown; findings below may reference images not displayed]

FINDINGS: Brain: No evidence of acute infarction, hemorrhage, hydrocephalus,
extra-axial collection or mass lesion/mass effect. Anterior and
inferior bifrontal encephalomalacia with superficial clips in the
midline.

Vascular: Atherosclerotic calcification.

Skull: Unremarkable bifrontal craniotomy.  No acute finding

Sinuses/Orbits: Nasopharyngeal fluid in the setting of intubation.
IMPRESSION: No acute finding or change since [DATE].
